# Patient Record
Sex: Male | Born: 1955 | Race: White | Hispanic: No | Marital: Married | Smoking: Former smoker
Health system: Southern US, Community
[De-identification: ages and names within clinical notes are randomized; demographics above are authoritative.]

## PROBLEM LIST (undated history)

## (undated) DIAGNOSIS — E119 Type 2 diabetes mellitus without complications: Secondary | ICD-10-CM

## (undated) DIAGNOSIS — Z720 Tobacco use: Secondary | ICD-10-CM

## (undated) DIAGNOSIS — E1165 Type 2 diabetes mellitus with hyperglycemia: Secondary | ICD-10-CM

## (undated) DIAGNOSIS — E785 Hyperlipidemia, unspecified: Secondary | ICD-10-CM

## (undated) DIAGNOSIS — K219 Gastro-esophageal reflux disease without esophagitis: Secondary | ICD-10-CM

## (undated) DIAGNOSIS — Z86010 Personal history of colonic polyps: Secondary | ICD-10-CM

## (undated) DIAGNOSIS — I2510 Atherosclerotic heart disease of native coronary artery without angina pectoris: Secondary | ICD-10-CM

## (undated) HISTORY — DX: Type 2 diabetes mellitus with hyperglycemia: E11.65

## (undated) HISTORY — DX: Atherosclerotic heart disease of native coronary artery without angina pectoris: I25.10

## (undated) HISTORY — DX: Gastro-esophageal reflux disease without esophagitis: K21.9

## (undated) HISTORY — PX: WISDOM TOOTH EXTRACTION: SHX21

## (undated) HISTORY — DX: Personal history of colonic polyps: Z86.010

## (undated) HISTORY — DX: Hyperlipidemia, unspecified: E78.5

## (undated) HISTORY — DX: Type 2 diabetes mellitus without complications: E11.9

## (undated) HISTORY — DX: Tobacco use: Z72.0

---

## 1996-01-26 HISTORY — PX: ORIF ACETABULAR FRACTURE: SHX5029

## 2011-10-11 ENCOUNTER — Encounter: Payer: Self-pay | Admitting: Family Medicine

## 2011-10-11 ENCOUNTER — Ambulatory Visit (INDEPENDENT_AMBULATORY_CARE_PROVIDER_SITE_OTHER): Payer: Self-pay | Admitting: Family Medicine

## 2011-10-11 VITALS — BP 122/82 | HR 84 | Temp 98.6°F | Ht 68.0 in | Wt 257.8 lb

## 2011-10-11 DIAGNOSIS — E669 Obesity, unspecified: Secondary | ICD-10-CM

## 2011-10-11 DIAGNOSIS — Z125 Encounter for screening for malignant neoplasm of prostate: Secondary | ICD-10-CM

## 2011-10-11 DIAGNOSIS — Z13220 Encounter for screening for lipoid disorders: Secondary | ICD-10-CM

## 2011-10-11 DIAGNOSIS — Z131 Encounter for screening for diabetes mellitus: Secondary | ICD-10-CM

## 2011-10-11 DIAGNOSIS — Z720 Tobacco use: Secondary | ICD-10-CM

## 2011-10-11 DIAGNOSIS — Z1211 Encounter for screening for malignant neoplasm of colon: Secondary | ICD-10-CM

## 2011-10-11 DIAGNOSIS — R5381 Other malaise: Secondary | ICD-10-CM

## 2011-10-11 DIAGNOSIS — F17200 Nicotine dependence, unspecified, uncomplicated: Secondary | ICD-10-CM

## 2011-10-11 DIAGNOSIS — R739 Hyperglycemia, unspecified: Secondary | ICD-10-CM

## 2011-10-11 DIAGNOSIS — R5383 Other fatigue: Secondary | ICD-10-CM

## 2011-10-11 DIAGNOSIS — R7309 Other abnormal glucose: Secondary | ICD-10-CM

## 2011-10-11 DIAGNOSIS — Z0000 Encounter for general adult medical examination without abnormal findings: Secondary | ICD-10-CM

## 2011-10-11 HISTORY — DX: Tobacco use: Z72.0

## 2011-10-11 LAB — PSA: PSA: 0.26 ng/mL (ref 0.10–4.00)

## 2011-10-11 LAB — BASIC METABOLIC PANEL
CO2: 27 mEq/L (ref 19–32)
Calcium: 9.2 mg/dL (ref 8.4–10.5)
Creatinine, Ser: 1.1 mg/dL (ref 0.4–1.5)

## 2011-10-11 LAB — CBC WITH DIFFERENTIAL/PLATELET
Basophils Absolute: 0 10*3/uL (ref 0.0–0.1)
Eosinophils Absolute: 0.1 10*3/uL (ref 0.0–0.7)
HCT: 42.9 % (ref 39.0–52.0)
Hemoglobin: 14.3 g/dL (ref 13.0–17.0)
Lymphs Abs: 2.9 10*3/uL (ref 0.7–4.0)
MCHC: 33.3 g/dL (ref 30.0–36.0)
MCV: 92.3 fl (ref 78.0–100.0)
Monocytes Absolute: 0.5 10*3/uL (ref 0.1–1.0)
Monocytes Relative: 4.7 % (ref 3.0–12.0)
Neutro Abs: 6.3 10*3/uL (ref 1.4–7.7)
RDW: 12.5 % (ref 11.5–14.6)

## 2011-10-11 NOTE — Progress Notes (Addendum)
Nature conservation officer at [facility] [address] [place] [address] Phone: [phone] Fax: [phone]  Date:  10/11/2011   Name:  Micheal Clark   DOB:  1955-07-03   MRN:  [id number] Gender: male Age: 56 y.o.  PCP:  Hannah Beat, MD    Chief Complaint: New patient   History of Present Illness:  Micheal Clark is a 56 y.o. pleasant patient who presents with the following:  DOT physical q 2 years  Preventative Health Maintenance Visit:  Health Maintenance Summary Reviewed and updated, unless pt declines services.  Tobacco History Reviewed. Dips Alcohol: No concerns, no excessive use Exercise Habits: Some activity, rec at least 30 mins 5 times a week (minimal now) STD concerns: no risk or activity to increase risk Drug Use: None Encouraged self-testicular check  No past medical history on file.  Past Surgical History  Procedure Date  . Total hip arthroplasty 1998    left    History  Substance Use Topics  . Smoking status: Former Games developer  . Smokeless tobacco: Current User    Types: Chew   Comment: quit 1993  . Alcohol Use: Yes     occassionally    Family History  Problem Relation Age of Onset  . Diabetes Mother   . Hypertension Brother   . Vision loss Paternal Grandfather     No Known Allergies  Medication list has been reviewed and updated.  No outpatient prescriptions prior to visit.    Review of Systems:   General: Denies fever, chills, sweats. No significant weight loss. Eyes: Denies blurring,significant itching ENT: Denies earache, sore throat, and hoarseness. Cardiovascular: Denies chest pains, palpitations, dyspnea on exertion Respiratory: Denies cough, dyspnea at rest,wheeezing Breast: no concerns about lumps GI: Denies nausea, vomiting, diarrhea, constipation, change in bowel habits, abdominal pain, melena, hematochezia GU: Denies penile discharge, ED, urinary flow / outflow problems. No STD concerns. Musculoskeletal:  Denies back pain, joint pain Derm: Denies rash, itching Neuro: Denies  paresthesias, frequent falls, frequent headaches Psych: Denies depression, anxiety Endocrine: Denies cold intolerance, heat intolerance, polydipsia Heme: Denies enlarged lymph nodes Allergy: No hayfever  Physical Examination: Filed Vitals:   10/11/11 1401  BP: 122/82  Pulse: 84  Temp: 98.6 F (37 C)   Filed Vitals:   10/11/11 1401  Height: 5\' 8"  (1.727 m)  Weight: 257 lb 12 oz (116.915 kg)   Body mass index is 39.19 kg/(m^2). Ideal Body Weight: Weight in (lb) to have BMI = 25: 164.1    Wt Readings from Last 3 Encounters:  10/11/11 257 lb 12 oz (116.915 kg)    GEN: well developed, well nourished, no acute distress Eyes: conjunctiva and lids normal, PERRLA, EOMI ENT: TM clear, nares clear, oral exam WNL Neck: supple, no lymphadenopathy, no thyromegaly, no JVD Pulm: clear to auscultation and percussion, respiratory effort normal CV: regular rate and rhythm, S1-S2, no murmur, rub or gallop, no bruits, peripheral pulses normal and symmetric, no cyanosis, clubbing, edema or varicosities Chest: no scars, masses GI: soft, non-tender; no hepatosplenomegaly, masses; active bowel sounds all quadrants GU: no hernia, testicular mass, penile discharge, or prostate enlargement Lymph: no cervical, axillary or inguinal adenopathy MSK: gait normal, muscle tone and strength WNL, no joint swelling, effusions, discoloration, crepitus  SKIN: clear, good turgor, color WNL, no rashes, lesions, or ulcerations Neuro: normal mental status, normal strength, sensation, and motion Psych: alert; oriented to person, place and time, normally interactive and not anxious or depressed in appearance.  Assessment and  Plan:  1. Routine general medical examination at a health care facility    2. Screening for lipoid disorders  LDL cholesterol, direct  3. Screening for diabetes mellitus  Basic metabolic panel  4. Special screening for  malignant neoplasm of prostate  PSA  5. Special screening for malignant neoplasm of colon  Fecal occult blood, imunochemical  6. Other malaise and fatigue  CBC with Differential  7. Smokeless tobacco use    8. Obesity     The patient's preventative maintenance and recommended screening tests for an annual wellness exam were reviewed in full today. Brought up to date unless services declined.  Counselled on the importance of diet, exercise, and its role in overall health and mortality. The patient's FH and SH was reviewed, including their home life, tobacco status, and drug and alcohol status.    Orders Today:  Orders Placed This Encounter  Procedures  . Fecal occult blood, imunochemical    Standing Status: Future     Number of Occurrences:      Standing Expiration Date: 10/10/2012  . CBC with Differential  . Basic metabolic panel  . LDL cholesterol, direct  . PSA    Updated Medication List: (Includes new medications, updates to list, dose adjustments) No outpatient encounter prescriptions on file as of 10/11/2011.    Medications Discontinued: There are no discontinued medications.   Hannah Beat, MD  Addendum:  Basic Metabolic Panel:    Component Value Date/Time   NA 137 10/11/2011 1445   K 4.0 10/11/2011 1445   CL 102 10/11/2011 1445   CO2 27 10/11/2011 1445   BUN 14 10/11/2011 1445   CREATININE 1.1 10/11/2011 1445   GLUCOSE 260* 10/11/2011 1445   CALCIUM 9.2 10/11/2011 1445    Lab Results  Component Value Date   HGBA1C 7.8* 10/11/2011    D/w patient. Sending in metformin XR 500 mg, 1 po daily, then increase to 2 po daily after 1 week.

## 2011-10-12 ENCOUNTER — Telehealth: Payer: Self-pay

## 2011-10-12 NOTE — Telephone Encounter (Signed)
Pt left v/m;missed call earlier from Dr Patsy Lager; call back  today after 5 pm or 12 noon 10/13/11.

## 2011-10-13 MED ORDER — METFORMIN HCL ER 500 MG PO TB24: ORAL_TABLET | ORAL | Status: DC

## 2011-10-13 NOTE — Addendum Note (Signed)
Addended by: Hannah Beat on: 10/13/2011 05:00 PM   Modules accepted: Orders

## 2011-10-13 NOTE — Telephone Encounter (Signed)
Discussed with him on the phone

## 2011-10-26 ENCOUNTER — Other Ambulatory Visit: Payer: Self-pay

## 2011-10-26 DIAGNOSIS — Z1211 Encounter for screening for malignant neoplasm of colon: Secondary | ICD-10-CM

## 2011-10-27 ENCOUNTER — Telehealth: Payer: Self-pay

## 2011-10-27 ENCOUNTER — Other Ambulatory Visit: Payer: Self-pay | Admitting: Family Medicine

## 2011-10-27 DIAGNOSIS — Z1211 Encounter for screening for malignant neoplasm of colon: Secondary | ICD-10-CM

## 2011-10-27 DIAGNOSIS — R195 Other fecal abnormalities: Secondary | ICD-10-CM

## 2011-10-27 NOTE — Telephone Encounter (Signed)
pts wife Bonita Quin called to get more detail about pt results called earlier. Patient's wife notified as instructed by telephone for result notes due to positive fecal blood test. Bonita Quin voiced understanding.

## 2011-10-28 ENCOUNTER — Telehealth: Payer: Self-pay

## 2011-10-28 NOTE — Telephone Encounter (Signed)
Pt left v/m returning call. Call pt [phone].

## 2011-10-29 ENCOUNTER — Encounter: Payer: Self-pay | Admitting: Internal Medicine

## 2011-10-29 NOTE — Telephone Encounter (Signed)
See 10/27/11 phone note

## 2011-11-11 ENCOUNTER — Ambulatory Visit (AMBULATORY_SURGERY_CENTER): Payer: Self-pay | Admitting: *Deleted

## 2011-11-11 VITALS — Ht 68.0 in | Wt 250.0 lb

## 2011-11-11 DIAGNOSIS — R195 Other fecal abnormalities: Secondary | ICD-10-CM

## 2011-11-11 DIAGNOSIS — Z1211 Encounter for screening for malignant neoplasm of colon: Secondary | ICD-10-CM

## 2011-11-11 MED ORDER — NA SULFATE-K SULFATE-MG SULF 17.5-3.13-1.6 GM/177ML PO SOLN: 1.0000 | Freq: Once | ORAL | Status: DC

## 2011-11-15 ENCOUNTER — Ambulatory Visit (INDEPENDENT_AMBULATORY_CARE_PROVIDER_SITE_OTHER): Payer: Self-pay | Admitting: Family Medicine

## 2011-11-15 ENCOUNTER — Encounter: Payer: Self-pay | Admitting: Family Medicine

## 2011-11-15 VITALS — BP 120/70 | HR 88 | Temp 98.4°F | Wt 253.5 lb

## 2011-11-15 DIAGNOSIS — E1165 Type 2 diabetes mellitus with hyperglycemia: Secondary | ICD-10-CM

## 2011-11-15 DIAGNOSIS — E785 Hyperlipidemia, unspecified: Secondary | ICD-10-CM

## 2011-11-15 DIAGNOSIS — E119 Type 2 diabetes mellitus without complications: Secondary | ICD-10-CM | POA: Insufficient documentation

## 2011-11-15 DIAGNOSIS — IMO0002 Reserved for concepts with insufficient information to code with codable children: Secondary | ICD-10-CM

## 2011-11-15 HISTORY — DX: Hyperlipidemia, unspecified: E78.5

## 2011-11-15 HISTORY — DX: Reserved for concepts with insufficient information to code with codable children: IMO0002

## 2011-11-15 HISTORY — DX: Type 2 diabetes mellitus with hyperglycemia: E11.65

## 2011-11-15 MED ORDER — METFORMIN HCL ER 500 MG PO TB24: 1000.0000 mg | ORAL_TABLET | Freq: Every day | ORAL | Status: DC

## 2011-11-15 MED ORDER — PRAVASTATIN SODIUM 40 MG PO TABS: 40.0000 mg | ORAL_TABLET | Freq: Every day | ORAL | Status: DC

## 2011-11-15 NOTE — Progress Notes (Signed)
Nature conservation officer at [facility] [address] [place] [address] Phone: [phone] Fax: [phone]  Date:  11/15/2011   Name:  Micheal Clark   DOB:  1955-09-01   MRN:  [id number] Gender: male Age: 56 y.o.  PCP:  Hannah Beat, MD  Evaluating MD: Hannah Beat, MD   Chief Complaint: Follow-up   History of Present Illness:  Micheal Clark is a 56 y.o. pleasant patient who presents with the following:  New onset DM:   New onset hyperlipidemia:  Told the patient's labs. He is a new onset diabetic. He has since started taking some metformin XOR 500 mg, 2 tablets a day. He is tolerating this well without any side effects and his blood sugars have been ranging from about 100 to 1:30 without any difficulties.  Also reviewed his hyperlipidemia, with an LDL at 163.  Results for orders placed in visit on 10/26/11  FECAL OCCULT BLOOD, IMMUNOCHEMICAL      Component Value Range   Fecal Occult Bld Positive  Negative    Lab Results  Component Value Date   HGBA1C 7.8* 10/11/2011   Lipids:    Component Value Date/Time   LDLDIRECT 162.9 10/11/2011 1445   Basic Metabolic Panel:    Component Value Date/Time   NA 137 10/11/2011 1445   K 4.0 10/11/2011 1445   CL 102 10/11/2011 1445   CO2 27 10/11/2011 1445   BUN 14 10/11/2011 1445   CREATININE 1.1 10/11/2011 1445   GLUCOSE 260* 10/11/2011 1445   CALCIUM 9.2 10/11/2011 1445     Patient Active Problem List  Diagnosis  . Smokeless tobacco use  . Obesity    Past Medical History  Diagnosis Date  . Smokeless tobacco use 10/11/2011  . Diabetes mellitus without complication     Past Surgical History  Procedure Date  . Total hip arthroplasty 1998    left  . Wisdom tooth extraction     History  Substance Use Topics  . Smoking status: Former Games developer  . Smokeless tobacco: Current User    Types: Chew   Comment: quit 1993  . Alcohol Use: Yes     occassionally-not wekly    Family History  Problem Relation  Age of Onset  . Diabetes Mother   . Hypertension Brother   . Vision loss Paternal Grandfather   . Colon cancer Neg Hx   . Rectal cancer Neg Hx   . Stomach cancer Neg Hx     No Known Allergies  Medication list has been reviewed and updated.  Outpatient Prescriptions Prior to Visit  Medication Sig Dispense Refill  . Na Sulfate-K Sulfate-Mg Sulf (SUPREP BOWEL PREP) SOLN Take 1 kit by mouth once. suprep as directed. No substitutions  354 mL  0  . metFORMIN (GLUCOPHAGE XR) 500 MG 24 hr tablet 1 po daily for 1 week, then 2 po daily  60 tablet  1    Review of Systems:   GEN: No acute illnesses, no fevers, chills. GI: No n/v/d, eating normally. GI f/u for heme + stool pending Pulm: No SOB Interactive and getting along well at home.  Otherwise, ROS is as per the HPI.   Physical Examination: Filed Vitals:   11/15/11 1544  BP: 120/70  Pulse: 88  Temp: 98.4 F (36.9 C)  TempSrc: Oral  Weight: 253 lb 8 oz (114.987 kg)    There is no height on file to calculate BMI. Ideal Body Weight:     GEN: WDWN, NAD,  Non-toxic, A & O x 3 HEENT: Atraumatic, Normocephalic. Neck supple. No masses, No LAD. Ears and Nose: No external deformity. CV: RRR, No M/G/R. No JVD. No thrill. No extra heart sounds. PULM: CTA B, no wheezes, crackles, rhonchi. No retractions. No resp. distress. No accessory muscle use. EXTR: No c/c/e NEURO Normal gait.  PSYCH: Normally interactive. Conversant. Not depressed or anxious appearing.  Calm demeanor.    Assessment and Plan:  1. Unstable diabetes mellitus   2. Diabetes type 2, uncontrolled   3. Hyperlipidemia    Reviewed DM Start pravachol  Recheck 6 mo  Orders Today:  No orders of the defined types were placed in this encounter.    Updated Medication List: (Includes new medications, updates to list, dose adjustments) Meds ordered this encounter  Medications  . DISCONTD: metFORMIN (GLUCOPHAGE-XR) 500 MG 24 hr tablet    Sig: Take 2 by mouth daily    . pravastatin (PRAVACHOL) 40 MG tablet    Sig: Take 1 tablet (40 mg total) by mouth daily.    Dispense:  90 tablet    Refill:  3  . metFORMIN (GLUCOPHAGE-XR) 500 MG 24 hr tablet    Sig: Take 2 tablets (1,000 mg total) by mouth daily with breakfast. Take 2 by mouth daily    Dispense:  180 tablet    Refill:  3    Medications Discontinued: Medications Discontinued During This Encounter  Medication Reason  . metFORMIN (GLUCOPHAGE XR) 500 MG 24 hr tablet   . metFORMIN (GLUCOPHAGE-XR) 500 MG 24 hr tablet Reorder     Hannah Beat, MD

## 2011-11-24 ENCOUNTER — Encounter: Payer: Self-pay | Admitting: Internal Medicine

## 2011-11-24 ENCOUNTER — Ambulatory Visit (AMBULATORY_SURGERY_CENTER): Payer: Self-pay | Admitting: Internal Medicine

## 2011-11-24 VITALS — BP 118/78 | HR 69 | Temp 97.7°F | Resp 16 | Ht 68.0 in | Wt 250.0 lb

## 2011-11-24 DIAGNOSIS — Z860100 Personal history of colon polyps, unspecified: Secondary | ICD-10-CM | POA: Insufficient documentation

## 2011-11-24 DIAGNOSIS — D126 Benign neoplasm of colon, unspecified: Secondary | ICD-10-CM

## 2011-11-24 DIAGNOSIS — Z1211 Encounter for screening for malignant neoplasm of colon: Secondary | ICD-10-CM

## 2011-11-24 DIAGNOSIS — R195 Other fecal abnormalities: Secondary | ICD-10-CM

## 2011-11-24 HISTORY — DX: Personal history of colon polyps, unspecified: Z86.0100

## 2011-11-24 HISTORY — DX: Personal history of colonic polyps: Z86.010

## 2011-11-24 MED ORDER — SODIUM CHLORIDE 0.9 % IV SOLN: 500.0000 mL | INTRAVENOUS | Status: DC

## 2011-11-24 NOTE — Op Note (Signed)
[facility] [address] [place], [address]   COLONOSCOPY PROCEDURE REPORT  PATIENT: Micheal Clark, Micheal Clark  MR#: [id number] BIRTHDATE: 01-02-1956 , 55  yrs. old GENDER: Male ENDOSCOPIST: Iva Boop, MD, [facility] REFERRED XB:[id number] Ward Chatters, M.D. PROCEDURE DATE:  11/24/2011 PROCEDURE:   Colonoscopy with snare polypectomy ASA CLASS:   Class II INDICATIONS:heme-positive stool. MEDICATIONS: propofol (Diprivan) 350mg  IV, MAC sedation, administered by CRNA, and These medications were titrated to patient response per physician's verbal order  DESCRIPTION OF PROCEDURE:   After the risks benefits and alternatives of the procedure were thoroughly explained, informed consent was obtained.  A digital rectal exam revealed no abnormalities of the rectum and A digital rectal exam revealed the prostate was not enlarged.   The LB [id number]  endoscope was introduced through the anus and advanced to the cecum, which was identified by both the appendix and ileocecal valve. No adverse events experienced.   The quality of the prep was Suprep excellent The instrument was then slowly withdrawn as the colon was fully examined.      COLON FINDINGS: Five polypoid shaped sessile and pedunculated polyps measuring 3-12 mm in size were found at the cecum, in the transverse colon, descending colon, and sigmoid colon.  A polypectomy was performed with a cold snare on the 3 diminutive polyps and using snare cautery on the two sigmoid polyps (10 and 12 mm).  The resection was complete and the polyp tissue was completely retrieved.   The colon mucosa was otherwise normal. Retroflexed views revealed no abnormalities. The time to cecum=2 minutes 33 seconds.  Withdrawal time=15 minutes 35 seconds.  The scope was withdrawn and the procedure completed. COMPLICATIONS: There were no complications.  ENDOSCOPIC IMPRESSION: 1.   Five sessile polyps measuring 3-12 mm in size were found at  the cecum, in the transverse colon, descending colon, and sigmoid colon; polypectomy was performed with a cold snare and using snare cautery 2.   The colon mucosa was otherwise normal with excllent prep  RECOMMENDATIONS: Hold aspirin, aspirin products, and anti-inflammatory medication for 1 week.   eSigned:  Iva Boop, MD, [facility] 11/24/2011 9:09 AM  cc: Juleen China, MD and The Patient

## 2011-11-24 NOTE — Progress Notes (Signed)
Patient did not have preoperative order for IV antibiotic SSI prophylaxis. (G8918)  Patient did not experience any of the following events: a burn prior to discharge; a fall within the facility; wrong site/side/patient/procedure/implant event; or a hospital transfer or hospital admission upon discharge from the facility. (G8907)  

## 2011-11-24 NOTE — Patient Instructions (Addendum)
Four polyps were removed. Dr. Patsy Lager was looking out for you by checking the stool fr blood. The polyps were removed so they cannot develop into cancer. I will send you a letter about the results.  Thank you for choosing me and [facility].  Iva Boop, MD, FACG   YOU HAD AN ENDOSCOPIC PROCEDURE TODAY AT THE  ENDOSCOPY CENTER: Refer to the procedure report that was given to you for any specific questions about what was found during the examination.  If the procedure report does not answer your questions, please call your gastroenterologist to clarify.  If you requested that your care partner not be given the details of your procedure findings, then the procedure report has been included in a sealed envelope for you to review at your convenience later.  YOU SHOULD EXPECT: Some feelings of bloating in the abdomen. Passage of more gas than usual.  Walking can help get rid of the air that was put into your GI tract during the procedure and reduce the bloating. If you had a lower endoscopy (such as a colonoscopy or flexible sigmoidoscopy) you may notice spotting of blood in your stool or on the toilet paper. If you underwent a bowel prep for your procedure, then you may not have a normal bowel movement for a few days.  DIET: Your first meal following the procedure should be a light meal and then it is ok to progress to your normal diet.  A half-sandwich or bowl of soup is an example of a good first meal.  Heavy or fried foods are harder to digest and may make you feel nauseous or bloated.  Likewise meals heavy in dairy and vegetables can cause extra gas to form and this can also increase the bloating.  Drink plenty of fluids but you should avoid alcoholic beverages for 24 hours.  ACTIVITY: Your care partner should take you home directly after the procedure.  You should plan to take it easy, moving slowly for the rest of the day.  You can resume normal activity the day after the  procedure however you should NOT DRIVE or use heavy machinery for 24 hours (because of the sedation medicines used during the test).    SYMPTOMS TO REPORT IMMEDIATELY: A gastroenterologist can be reached at any hour.  During normal business hours, 8:30 AM to 5:00 PM Monday through Friday, call [phone].  After hours and on weekends, please call the GI answering service at [phone] who will take a message and have the physician on call contact you.   Following lower endoscopy (colonoscopy or flexible sigmoidoscopy):  Excessive amounts of blood in the stool  Significant tenderness or worsening of abdominal pains  Swelling of the abdomen that is new, acute  Fever of 100F or higher   FOLLOW UP: If any biopsies were taken you will be contacted by phone or by letter within the next 1-3 weeks.  Call your gastroenterologist if you have not heard about the biopsies in 3 weeks.  Our staff will call the home number listed on your records the next business day following your procedure to check on you and address any questions or concerns that you may have at that time regarding the information given to you following your procedure. This is a courtesy call and so if there is no answer at the home number and we have not heard from you through the emergency physician on call, we will assume that you have returned to your regular  daily activities without incident.  SIGNATURES/CONFIDENTIALITY: You and/or your care partner have signed paperwork which will be entered into your electronic medical record.  These signatures attest to the fact that that the information above on your After Visit Summary has been reviewed and is understood.  Full responsibility of the confidentiality of this discharge information lies with you and/or your care-partner.    Hold aspirin,aspirin products and anti-inflammatory medications for 1 week, but resume all other medications.Information given on polyps with discharge  instructions.

## 2011-11-25 ENCOUNTER — Telehealth: Payer: Self-pay | Admitting: *Deleted

## 2011-11-25 NOTE — Telephone Encounter (Signed)
  Follow up Call-  Call back number 11/24/2011  Post procedure Call Back phone  # [phone]  Permission to leave phone message Yes     Patient questions:  Do you have a fever, pain , or abdominal swelling? no Pain Score  0 *  Have you tolerated food without any problems? yes  Have you been able to return to your normal activities? yes  Do you have any questions about your discharge instructions: Diet   no Medications  no Follow up visit  no  Do you have questions or concerns about your Care? no  Actions: * If pain score is 4 or above: No action needed, pain <4.

## 2011-11-30 ENCOUNTER — Encounter: Payer: Self-pay | Admitting: Internal Medicine

## 2011-11-30 NOTE — Progress Notes (Signed)
Quick Note:  4 adenomas max 12 mm Repeat colon 11/2014 ______

## 2012-01-31 ENCOUNTER — Other Ambulatory Visit: Payer: Self-pay | Admitting: Family Medicine

## 2012-05-03 ENCOUNTER — Other Ambulatory Visit: Payer: Self-pay | Admitting: Family Medicine

## 2013-10-03 ENCOUNTER — Ambulatory Visit (INDEPENDENT_AMBULATORY_CARE_PROVIDER_SITE_OTHER): Payer: Self-pay | Admitting: Family Medicine

## 2013-10-03 ENCOUNTER — Encounter: Payer: Self-pay | Admitting: Family Medicine

## 2013-10-03 VITALS — BP 124/70 | HR 91 | Temp 98.6°F | Ht 66.75 in | Wt 234.0 lb

## 2013-10-03 DIAGNOSIS — R7309 Other abnormal glucose: Secondary | ICD-10-CM

## 2013-10-03 DIAGNOSIS — R739 Hyperglycemia, unspecified: Secondary | ICD-10-CM

## 2013-10-03 DIAGNOSIS — IMO0002 Reserved for concepts with insufficient information to code with codable children: Secondary | ICD-10-CM

## 2013-10-03 DIAGNOSIS — E1165 Type 2 diabetes mellitus with hyperglycemia: Secondary | ICD-10-CM

## 2013-10-03 DIAGNOSIS — IMO0001 Reserved for inherently not codable concepts without codable children: Secondary | ICD-10-CM

## 2013-10-03 DIAGNOSIS — E785 Hyperlipidemia, unspecified: Secondary | ICD-10-CM

## 2013-10-03 LAB — GLUCOSE, POCT (MANUAL RESULT ENTRY): POC GLUCOSE: 233 mg/dL — AB (ref 70–99)

## 2013-10-03 MED ORDER — METFORMIN HCL ER 500 MG PO TB24: 500.0000 mg | ORAL_TABLET | Freq: Every day | ORAL | Status: DC

## 2013-10-03 MED ORDER — SIMVASTATIN 40 MG PO TABS
40.0000 mg | ORAL_TABLET | Freq: Every day | ORAL | Status: DC
Start: 2013-10-03 — End: 2014-12-28

## 2013-10-03 MED ORDER — METFORMIN HCL ER 500 MG PO TB24: 1000.0000 mg | ORAL_TABLET | Freq: Every day | ORAL | Status: DC

## 2013-10-03 NOTE — Patient Instructions (Signed)
Write your blood sugar down twice a day.  Do it one time fasting in the morning.   Another time 2 hours after eating -- like 2 hours after lunch.  Vary that up a little bit between breakfast, lunch, and dinner.

## 2013-10-03 NOTE — Progress Notes (Signed)
Dr. Frederico Hamman T. Herley Bernardini, MD, [facility] [place], [address] Phone: [phone] Fax: [phone]  10/03/2013  Patient: Micheal Clark, MRN: [id number], DOB: 27-May-1955, 58 y.o.  Primary Physician:  Owens Loffler, MD  Chief Complaint: Check Blood Sugar  Subjective:   Micheal Clark is a 58 y.o. very pleasant male patient who presents with the following:  Patient lost insurance and stopped all of his medication.  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  Stopped lipid meds, too.  Results for orders placed in visit on 10/03/13  GLUCOSE, POCT (MANUAL RESULT ENTRY)      Result Value Ref Range   POC Glucose 233 (*) 70 - 99 mg/dl     Past Medical History, Surgical History, Social History, Family History, Problem List, Medications, and Allergies have been reviewed and updated if relevant.   GEN: No acute illnesses, no fevers, chills. GI: No n/v/d, eating normally Pulm: No SOB Interactive and getting along well at home.  Otherwise, ROS is as per the HPI.  Objective:   BP 124/70  Pulse 91  Temp(Src) 98.6 F (37 C) (Oral)  Ht 5' 6.75" (1.695 m)  Wt 234 lb (106.142 kg)  BMI 36.94 kg/m2  SpO2 97%  GEN: WDWN, NAD, Non-toxic, A & O x 3 HEENT: Atraumatic, Normocephalic. Neck supple. No masses, No LAD. Ears and Nose: No external deformity. CV: RRR, No M/G/R. No JVD. No thrill. No extra heart sounds. PULM: CTA B, no wheezes, crackles, rhonchi. No retractions. No resp. distress. No accessory muscle use. EXTR: No c/c/e NEURO Normal gait.  PSYCH: Normally interactive. Conversant. Not depressed or anxious appearing.  Calm demeanor.   Laboratory and Imaging Data:  Assessment and Plan:   Diabetes type 2, uncontrolled  Hyperglycemia - Plan: Glucose (CBG)  Hyperlipidemia  Restart meds, check bs bid, am and 2 hours after meal.   Not cleared until more stable.   Follow-up: Return in about 2 weeks  (around 10/17/2013).  New Prescriptions   METFORMIN (GLUCOPHAGE-XR) 500 MG 24 HR TABLET    Take 2 tablets (1,000 mg total) by mouth daily with breakfast.   SIMVASTATIN (ZOCOR) 40 MG TABLET    Take 1 tablet (40 mg total) by mouth at bedtime.   Orders Placed This Encounter  Procedures  . Glucose (CBG)   Patient Instructions  Write your blood sugar down twice a day.  Do it one time fasting in the morning.   Another time 2 hours after eating -- like 2 hours after lunch.  Vary that up a little bit between breakfast, lunch, and dinner.      Signed,  Maud Deed. Hailei Besser, MD   Patient's Medications  New Prescriptions   METFORMIN (GLUCOPHAGE-XR) 500 MG 24 HR TABLET    Take 2 tablets (1,000 mg total) by mouth daily with breakfast.   SIMVASTATIN (ZOCOR) 40 MG TABLET    Take 1 tablet (40 mg total) by mouth at bedtime.  Previous Medications   No medications on file  Modified Medications   No medications on file  Discontinued Medications   METFORMIN (GLUCOPHAGE-XR) 500 MG 24 HR TABLET    Take 2 tablets (1,000 mg total) by mouth daily with breakfast. Take 2 by mouth daily   METFORMIN (GLUCOPHAGE-XR) 500 MG 24 HR TABLET    TAKE 1 TABLET DAILY FOR 1 WEEK THEN TAKE TWO TABLETS DAILY   PRAVASTATIN (PRAVACHOL) 40 MG TABLET  Take 1 tablet (40 mg total) by mouth daily.

## 2013-10-03 NOTE — Progress Notes (Signed)
Pre visit review using our clinic review tool, if applicable. No additional management support is needed unless otherwise documented below in the visit note. 

## 2013-10-10 ENCOUNTER — Encounter: Payer: Self-pay | Admitting: Family Medicine

## 2013-10-10 ENCOUNTER — Ambulatory Visit (INDEPENDENT_AMBULATORY_CARE_PROVIDER_SITE_OTHER): Payer: Self-pay | Admitting: Family Medicine

## 2013-10-10 VITALS — BP 128/76 | HR 65 | Temp 98.2°F | Ht 66.75 in | Wt 235.0 lb

## 2013-10-10 DIAGNOSIS — IMO0002 Reserved for concepts with insufficient information to code with codable children: Secondary | ICD-10-CM

## 2013-10-10 DIAGNOSIS — IMO0001 Reserved for inherently not codable concepts without codable children: Secondary | ICD-10-CM

## 2013-10-10 DIAGNOSIS — E1165 Type 2 diabetes mellitus with hyperglycemia: Secondary | ICD-10-CM

## 2013-10-10 NOTE — Progress Notes (Signed)
   Dr. Frederico Hamman T. Marlynn Hinckley, MD, [facility] [place], [address] Phone: [phone] Fax: [phone]  10/10/2013  Patient: Micheal Clark, MRN: [id number], DOB: 1955-03-11, 58 y.o.  Primary Physician:  Owens Loffler, MD  Chief Complaint: Follow-up  Subjective:   Micheal Clark is a 58 y.o. very pleasant male patient who presents with the following:  F/u DM: 1 week on Metformin XR, 500 mg x 2. BS has improved. BS improved from 105-170 range.   Past Medical History, Surgical History, Social History, Family History, Problem List, Medications, and Allergies have been reviewed and updated if relevant.   GEN: No acute illnesses, no fevers, chills. GI: No n/v/d, eating normally Pulm: No SOB Interactive and getting along well at home.  Otherwise, ROS is as per the HPI.  Objective:   BP 128/76  Pulse 65  Temp(Src) 98.2 F (36.8 C) (Oral)  Ht 5' 6.75" (1.695 m)  Wt 235 lb (106.595 kg)  BMI 37.10 kg/m2  SpO2 98%  GEN: WDWN, NAD, Non-toxic, A & O x 3 HEENT: Atraumatic, Normocephalic. Neck supple. No masses, No LAD. Ears and Nose: No external deformity. CV: RRR, No M/G/R. No JVD. No thrill. No extra heart sounds. PULM: CTA B, no wheezes, crackles, rhonchi. No retractions. No resp. distress. No accessory muscle use. EXTR: No c/c/e NEURO Normal gait.  PSYCH: Normally interactive. Conversant. Not depressed or anxious appearing.  Calm demeanor.   Laboratory and Imaging Data:  Assessment and Plan:   Diabetes type 2, uncontrolled  Notes reviewed, improving and letter given  Follow-up: No Follow-up on file.  New Prescriptions   No medications on file   No orders of the defined types were placed in this encounter.    Signed,  Maud Deed. Ruthann Angulo, MD   Patient's Medications  New Prescriptions   No medications on file  Previous Medications   METFORMIN (GLUCOPHAGE-XR) 500 MG 24 HR TABLET    Take 2 tablets  (1,000 mg total) by mouth daily with breakfast.   SIMVASTATIN (ZOCOR) 40 MG TABLET    Take 1 tablet (40 mg total) by mouth at bedtime.  Modified Medications   No medications on file  Discontinued Medications   No medications on file

## 2013-10-10 NOTE — Progress Notes (Signed)
Pre visit review using our clinic review tool, if applicable. No additional management support is needed unless otherwise documented below in the visit note. 

## 2014-07-15 ENCOUNTER — Telehealth: Payer: Self-pay

## 2014-07-15 NOTE — Telephone Encounter (Signed)
Diabetic Bundle. Attempted to contact the pt. Phone number would not connect.

## 2014-08-20 ENCOUNTER — Other Ambulatory Visit: Payer: Self-pay | Admitting: Family Medicine

## 2014-08-20 ENCOUNTER — Telehealth: Payer: Self-pay

## 2014-08-20 NOTE — Telephone Encounter (Signed)
Pt called and requested refill of metformin to [facility]; advised pt refill had been done earlier today and pt will ck with pharmacy for pick up.

## 2014-08-20 NOTE — Telephone Encounter (Signed)
Please call and schedule CPE with fasting labs with Dr. Lorelei Pont.

## 2014-08-26 ENCOUNTER — Ambulatory Visit (INDEPENDENT_AMBULATORY_CARE_PROVIDER_SITE_OTHER): Payer: Self-pay | Admitting: Family Medicine

## 2014-08-26 ENCOUNTER — Encounter: Payer: Self-pay | Admitting: Family Medicine

## 2014-08-26 VITALS — BP 146/84 | HR 70 | Temp 98.2°F | Ht 66.75 in | Wt 236.8 lb

## 2014-08-26 DIAGNOSIS — IMO0002 Reserved for concepts with insufficient information to code with codable children: Secondary | ICD-10-CM

## 2014-08-26 DIAGNOSIS — E785 Hyperlipidemia, unspecified: Secondary | ICD-10-CM

## 2014-08-26 DIAGNOSIS — E1165 Type 2 diabetes mellitus with hyperglycemia: Secondary | ICD-10-CM

## 2014-08-26 LAB — LIPID PANEL
CHOL/HDL RATIO: 4
Cholesterol: 176 mg/dL (ref 0–200)
HDL: 41.2 mg/dL (ref >39.00)
LDL Cholesterol: 111 mg/dL — ABNORMAL HIGH (ref 0–99)
NonHDL: 135.17
Triglycerides: 121 mg/dL (ref 0.0–149.0)
VLDL: 24.2 mg/dL (ref 0.0–40.0)

## 2014-08-26 LAB — HEMOGLOBIN A1C: HEMOGLOBIN A1C: 9.3 % — AB (ref 4.6–6.5)

## 2014-08-26 NOTE — Progress Notes (Signed)
Pre visit review using our clinic review tool, if applicable. No additional management support is needed unless otherwise documented below in the visit note. 

## 2014-08-26 NOTE — Progress Notes (Signed)
Dr. Frederico Hamman T. Kalen Ratajczak, MD, [facility] Primary Care and Sports Medicine [place], [address] Phone: [phone] Fax: [phone]  08/26/2014  Patient: Micheal Clark, MRN: [id number], DOB: [birth year], 59 y.o.  Primary Physician:  Owens Loffler, MD  Chief Complaint: Diabetes  Subjective:   Micheal Clark is a 60 y.o. very pleasant male patient who presents with the following:  147/70 at MD for his shoulder last week.  125/88.   170-260 at home.   Diabetes Mellitus: Tolerating Medications: yes Compliance with diet: fair Exercise: minimal / intermittent Avg blood sugars at home: 170-260 Foot problems: none Hypoglycemia: none No nausea, vomitting, blurred vision, polyuria.  Lab Results  Component Value Date   HGBA1C 7.8* 10/11/2011   Lab Results  Component Value Date   CREATININE 1.1 10/11/2011    Wt Readings from Last 3 Encounters:  08/26/14 236 lb 12 oz (107.389 kg)  10/10/13 235 lb (106.595 kg)  10/03/13 234 lb (106.142 kg)    Body mass index is 37.38 kg/(m^2).   Lipids: Doing well, stable. Tolerating meds fine with no SE. Panel reviewed with patient.  Lipids: Lipids:    Component Value Date/Time   LDLDIRECT 162.9 10/11/2011 1445     Past Medical History, Surgical History, Social History, Family History, Problem List, Medications, and Allergies have been reviewed and updated if relevant.  Patient Active Problem List   Diagnosis Date Noted  . Personal history of adenomatous colonic polyps 11/24/2011  . Diabetes type 2, uncontrolled 11/15/2011  . Hyperlipidemia LDL goal <70 11/15/2011  . Smokeless tobacco use 10/11/2011  . Obesity 10/11/2011    Past Medical History  Diagnosis Date  . Smokeless tobacco use 10/11/2011  . Diabetes mellitus without complication   . Diabetes type 2, uncontrolled 11/15/2011  . Hyperlipidemia 11/15/2011  . Personal history of adenomatous colonic polyps 11/24/2011    Past Surgical  History  Procedure Laterality Date  . Total hip arthroplasty  1998    left  . Wisdom tooth extraction      History   Social History  . Marital Status: Married    Spouse Name: N/A  . Number of Children: 2  . Years of Education: N/A   Occupational History  . [employer]     truck driver   Social History Main Topics  . Smoking status: Former Research scientist (life sciences)  . Smokeless tobacco: Current User    Types: Chew     Comment: quit 1993  . Alcohol Use: Yes     Comment: occassionally-not wekly  . Drug Use: No  . Sexual Activity: Not on file   Other Topics Concern  . Not on file   Social History Narrative    Family History  Problem Relation Age of Onset  . Diabetes Mother   . Hypertension Brother   . Vision loss Paternal Grandfather   . Colon cancer Neg Hx   . Rectal cancer Neg Hx   . Stomach cancer Neg Hx     No Known Allergies  Medication list reviewed and updated in full in [place].   GEN: No acute illnesses, no fevers, chills. GI: No n/v/d, eating normally Pulm: No SOB Interactive and getting along well at home.  Otherwise, ROS is as per the HPI.  Objective:   BP 146/84 mmHg  Pulse 70  Temp(Src) 98.2 F (36.8 C) (Oral)  Ht 5' 6.75" (1.695 m)  Wt 236 lb 12 oz (107.389 kg)  BMI 37.38 kg/m2  GEN: WDWN, NAD, Non-toxic, A & O x 3 HEENT: Atraumatic, Normocephalic. Neck supple. No masses, No LAD. Ears and Nose: No external deformity. CV: RRR, No M/G/R. No JVD. No thrill. No extra heart sounds. PULM: CTA B, no wheezes, crackles, rhonchi. No retractions. No resp. distress. No accessory muscle use. EXTR: No c/c/e NEURO Normal gait.  PSYCH: Normally interactive. Conversant. Not depressed or anxious appearing.  Calm demeanor.   Laboratory and Imaging Data:  Assessment and Plan:   Diabetes type 2, uncontrolled - Plan: Hemoglobin A1c  Hyperlipidemia LDL goal <70 - Plan: Lipid panel  Check DM and lipid today Adjust meds if needed  Follow-up: 6  mo  New Prescriptions   No medications on file   Orders Placed This Encounter  Procedures  . Hemoglobin A1c  . Lipid panel    Signed,  Frederico Hamman T. Raelin Pixler, MD   Patient's Medications  New Prescriptions   No medications on file  Previous Medications   METFORMIN (GLUCOPHAGE-XR) 500 MG 24 HR TABLET    TAKE 2 TABLETS (1,000 MG TOTAL) BY MOUTH DAILY WITH BREAKFAST.   NAPROXEN (NAPROSYN) 500 MG TABLET    Take 500 mg by mouth daily.    SIMVASTATIN (ZOCOR) 40 MG TABLET    Take 1 tablet (40 mg total) by mouth at bedtime.   TRAMADOL (ULTRAM) 50 MG TABLET    Take 50 mg by mouth every 6 (six) hours as needed.   Modified Medications   No medications on file  Discontinued Medications   No medications on file

## 2014-08-30 ENCOUNTER — Telehealth: Payer: Self-pay | Admitting: *Deleted

## 2014-08-30 MED ORDER — METFORMIN HCL ER 500 MG PO TB24: 2000.0000 mg | ORAL_TABLET | Freq: Every day | ORAL | Status: DC

## 2014-08-30 NOTE — Telephone Encounter (Signed)
Legrand Como notified as instructed by telephone.  Prescription for Metformin with new dosing instructions sent to [facility] on [address].

## 2014-08-30 NOTE — Telephone Encounter (Signed)
-----   Message from Owens Loffler, MD sent at 08/29/2014  6:10 PM EDT ----- Please call  DM came back not looking too good. a1c is 9.3  Chol is not that bad  We should increase his metformin to 4 tablets a day. He will likely need a new script.  Metformin XR 500 mg, 4 tabs po daily.  Electronically prescribe to the pharmacy of their choice. (May call in if pharmacy does not participate in electronic prescriptions) Call in #120, 11 refills. OR if they prefer a 90 day supply, #360 with 3 refills is OK, too

## 2014-12-23 ENCOUNTER — Encounter: Payer: Self-pay | Admitting: Internal Medicine

## 2014-12-28 ENCOUNTER — Other Ambulatory Visit: Payer: Self-pay | Admitting: Family Medicine

## 2015-04-24 ENCOUNTER — Encounter: Payer: Self-pay | Admitting: Family Medicine

## 2015-04-24 ENCOUNTER — Ambulatory Visit (INDEPENDENT_AMBULATORY_CARE_PROVIDER_SITE_OTHER)
Admission: RE | Admit: 2015-04-24 | Discharge: 2015-04-24 | Disposition: A | Discharge status: Home / Self Care | Payer: Self-pay | Source: Ambulatory Visit | Attending: Family Medicine | Admitting: Family Medicine

## 2015-04-24 ENCOUNTER — Ambulatory Visit (INDEPENDENT_AMBULATORY_CARE_PROVIDER_SITE_OTHER): Payer: Self-pay | Admitting: Family Medicine

## 2015-04-24 VITALS — BP 126/84 | HR 90 | Temp 98.5°F | Ht 66.75 in | Wt 234.0 lb

## 2015-04-24 DIAGNOSIS — M1652 Unilateral post-traumatic osteoarthritis, left hip: Secondary | ICD-10-CM

## 2015-04-24 DIAGNOSIS — Z96642 Presence of left artificial hip joint: Secondary | ICD-10-CM

## 2015-04-24 DIAGNOSIS — M25552 Pain in left hip: Secondary | ICD-10-CM

## 2015-04-24 NOTE — Progress Notes (Signed)
Pre visit review using our clinic review tool, if applicable. No additional management support is needed unless otherwise documented below in the visit note. 
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
## 2015-06-25 ENCOUNTER — Encounter: Payer: Self-pay | Admitting: Family Medicine

## 2015-06-25 ENCOUNTER — Ambulatory Visit (INDEPENDENT_AMBULATORY_CARE_PROVIDER_SITE_OTHER): Payer: Self-pay | Admitting: Family Medicine

## 2015-06-25 ENCOUNTER — Telehealth: Payer: Self-pay | Admitting: Family Medicine

## 2015-06-25 VITALS — BP 100/78 | HR 103 | Temp 98.5°F | Ht 66.75 in | Wt 228.5 lb

## 2015-06-25 DIAGNOSIS — IMO0001 Reserved for inherently not codable concepts without codable children: Secondary | ICD-10-CM

## 2015-06-25 DIAGNOSIS — E1165 Type 2 diabetes mellitus with hyperglycemia: Secondary | ICD-10-CM

## 2015-06-25 DIAGNOSIS — M1652 Unilateral post-traumatic osteoarthritis, left hip: Secondary | ICD-10-CM

## 2015-06-25 DIAGNOSIS — R9431 Abnormal electrocardiogram [ECG] [EKG]: Secondary | ICD-10-CM

## 2015-06-25 DIAGNOSIS — E785 Hyperlipidemia, unspecified: Secondary | ICD-10-CM

## 2015-06-25 DIAGNOSIS — R0602 Shortness of breath: Secondary | ICD-10-CM

## 2015-06-25 NOTE — Patient Instructions (Signed)

## 2015-06-25 NOTE — Progress Notes (Signed)
Dr. Frederico Hamman T. Yaneisy Wenz, MD, [facility] Primary Care and Sports Medicine [place], [address] Phone: [phone] Fax: [phone]  06/25/2015  Patient: Micheal Clark, MRN: [id number], DOB: 07/06/1955, 60 y.o.  Primary Physician:  Owens Loffler, MD   Chief Complaint  Patient presents with  . Shortness of Breath   Subjective:   Micheal Clark is a 60 y.o. very pleasant male patient who presents with the following:  Pleasant gentleman with end-stage left posttraumatic hip osteoarthritis with worsening shortness of breath on exertion over the last 2 weeks.  Without exercise or exertion, he is not having any significant shortness of breath.  Or at least minimal.  He has never been a regular smoker other than smoking for an occasional cigarette for less than a year as a young man.  He is never been a regular  Marijuana or cocaine smoker.  Cardiac risk factors include type 2 diabetes.  Uncontrolled on recent check.  Labs pending. Hyperlipidemia on statin. Obesity. Smokeless tobacco use. No known significant family history.  SOB x 2 weeks, has not been exercising.  Never was a regular smoker.  Feels like breathing really hard.   No sick or runny noses.  All just SOB  Past Medical History, Surgical History, Social History, Family History, Problem List, Medications, and Allergies have been reviewed and updated if relevant.  Patient Active Problem List   Diagnosis Date Noted  . Personal history of adenomatous colonic polyps 11/24/2011  . Diabetes type 2, uncontrolled ([place]) 11/15/2011  . Hyperlipidemia LDL goal <70 11/15/2011  . Smokeless tobacco use 10/11/2011  . Obesity 10/11/2011    Past Medical History  Diagnosis Date  . Smokeless tobacco use 10/11/2011  . Diabetes mellitus without complication ([place])   . Diabetes type 2, uncontrolled ([place]) 11/15/2011  . Hyperlipidemia 11/15/2011  . Personal history of adenomatous colonic polyps 11/24/2011     Past Surgical History  Procedure Laterality Date  . Total hip arthroplasty  1998    left  . Wisdom tooth extraction      Social History   Social History  . Marital Status: Married    Spouse Name: N/A  . Number of Children: 2  . Years of Education: N/A   Occupational History  . [employer]     truck driver   Social History Main Topics  . Smoking status: Former Research scientist (life sciences)  . Smokeless tobacco: Current User    Types: Chew     Comment: quit 1993  . Alcohol Use: Yes     Comment: occassionally-not wekly  . Drug Use: No  . Sexual Activity: Not on file   Other Topics Concern  . Not on file   Social History Narrative    Family History  Problem Relation Age of Onset  . Diabetes Mother   . Hypertension Brother   . Vision loss Paternal Grandfather   . Colon cancer Neg Hx   . Rectal cancer Neg Hx   . Stomach cancer Neg Hx     No Known Allergies  Medication list reviewed and updated in full in [place].   GEN: No acute illnesses, no fevers, chills. GI: No n/v/d, eating normally Pulm: + SOB Interactive and getting along well at home.  Otherwise, ROS is as per the HPI.  Objective:   BP 100/78 mmHg  Pulse 103  Temp(Src) 98.5 F (36.9 C) (Oral)  Ht 5' 6.75" (1.695 m)  Wt 228 lb 8 oz (103.647 kg)  BMI 36.08 kg/m2  SpO2 95%  GEN: WDWN, NAD, Non-toxic, A & O x 3 HEENT: Atraumatic, Normocephalic. Neck supple. No masses, No LAD. Ears and Nose: No external deformity. CV: RRR, No M/G/R. No JVD. No thrill. No extra heart sounds. PULM: CTA B, no wheezes, crackles, rhonchi. No retractions. No resp. distress. No accessory muscle use. EXTR: No c/c/e NEURO Normal gait.  PSYCH: Normally interactive. Conversant. Not depressed or anxious appearing.  Calm demeanor.   Laboratory and Imaging Data:  Assessment and Plan:   SOB (shortness of breath) on exertion - Plan: EKG 12-Lead, CBC with Differential/Platelet, Ambulatory referral to Cardiology  Uncontrolled  type 2 diabetes mellitus without complication, without long-term current use of insulin (HCC) - Plan: Hemoglobin 123456, Basic metabolic panel, Ambulatory referral to Cardiology  Hyperlipidemia LDL goal <70 - Plan: Ambulatory referral to Cardiology  Nonspecific abnormal electrocardiogram (ECG) (EKG) - Plan: Ambulatory referral to Cardiology  Post-traumatic osteoarthritis of left hip  EKG: Normal sinus rhythm. Normal axis, normal R wave progression, RBBB. Some leads contain T-wave inversion.  No clear ST depression or elevation.  New-onset dyspnea on exertion without a significant pulmonary history and without a significant smoking history, consult cardiology for concern for potential cardiac cause.  Obtain basic laboratories as below.  We also discussed potential total hip arthroplasty on the left, and I had previously discuss this with  Some of my surgical colleagues. Complex case given his prior acetabular fracture.   We would need to have him see someone with fellowship training in complex hip replacement.  Prior issues above take precedence.  Orders Placed This Encounter  Procedures  . Hemoglobin A1c  . Basic metabolic panel  . CBC with Differential/Platelet  . Ambulatory referral to Cardiology  . EKG 12-Lead    Signed,  Dayshon Roback T. Dangelo Guzzetta, MD   Patient's Medications  New Prescriptions   No medications on file  Previous Medications   METFORMIN (GLUCOPHAGE-XR) 500 MG 24 HR TABLET    Take 4 tablets (2,000 mg total) by mouth daily with breakfast.   SIMVASTATIN (ZOCOR) 40 MG TABLET    TAKE 1 TABLET (40 MG TOTAL) BY MOUTH AT BEDTIME.  Modified Medications   No medications on file  Discontinued Medications   No medications on file

## 2015-06-25 NOTE — Progress Notes (Signed)
Pre visit review using our clinic review tool, if applicable. No additional management support is needed unless otherwise documented below in the visit note. 

## 2015-06-25 NOTE — Telephone Encounter (Signed)
Patient Name: Micheal Clark DOB: [birth year] Initial Comment Caller states he would like to make an appt. He is having shortness of breath. Nurse Assessment Nurse: Ronnald Ramp, RN, Miranda Date/Time (Eastern Time): 06/25/2015 2:02:17 PM Confirm and document reason for call. If symptomatic, describe symptoms. You must click the next button to save text entered. ---Caller states he has been having SOB with activity for a couple of weeks. Has the patient traveled out of the country within the last 30 days? ---No Does the patient have any new or worsening symptoms? ---Yes Will a triage be completed? ---Yes Related visit to physician within the last 2 weeks? ---No Does the PT have any chronic conditions? (i.e. diabetes, asthma, etc.) ---Yes List chronic conditions. ---Diabetes, High Cholesterol Is this a behavioral health or substance abuse call? ---No Guidelines Guideline Title Affirmed Question Affirmed Notes Breathing Difficulty [1] MILD difficulty breathing (e.g., minimal/no SOB at rest, SOB with walking, pulse <100) AND [2] NEW-onset or WORSE than normal Final Disposition User See Physician within 4 Hours (or PCP triage) Ronnald Ramp, RN, Miranda Comments Appt scheduled for 4pm today with Dr. Edilia Bo Referrals REFERRED TO PCP OFFICE Disagree/Comply: Comply

## 2015-06-25 NOTE — Telephone Encounter (Signed)
TH scheduled appt with Dr Lorelei Pont 06/25/15 at 4 pm.

## 2015-06-26 DIAGNOSIS — M1652 Unilateral post-traumatic osteoarthritis, left hip: Secondary | ICD-10-CM | POA: Insufficient documentation

## 2015-06-26 LAB — CBC WITH DIFFERENTIAL/PLATELET
BASOS PCT: 0.5 % (ref 0.0–3.0)
Basophils Absolute: 0.1 10*3/uL (ref 0.0–0.1)
EOS ABS: 0.1 10*3/uL (ref 0.0–0.7)
Eosinophils Relative: 0.9 % (ref 0.0–5.0)
HCT: 48.1 % (ref 39.0–52.0)
HEMOGLOBIN: 16.1 g/dL (ref 13.0–17.0)
Lymphocytes Relative: 22.3 % (ref 12.0–46.0)
Lymphs Abs: 2.5 10*3/uL (ref 0.7–4.0)
MCHC: 33.5 g/dL (ref 30.0–36.0)
MCV: 90.2 fl (ref 78.0–100.0)
MONO ABS: 0.4 10*3/uL (ref 0.1–1.0)
Monocytes Relative: 3.4 % (ref 3.0–12.0)
NEUTROS PCT: 72.9 % (ref 43.0–77.0)
Neutro Abs: 8.2 10*3/uL — ABNORMAL HIGH (ref 1.4–7.7)
Platelets: 257 10*3/uL (ref 150.0–400.0)
RBC: 5.33 Mil/uL (ref 4.22–5.81)
RDW: 12.8 % (ref 11.5–15.5)
WBC: 11.2 10*3/uL — AB (ref 4.0–10.5)

## 2015-06-26 LAB — BASIC METABOLIC PANEL
BUN: 13 mg/dL (ref 6–23)
CALCIUM: 9.8 mg/dL (ref 8.4–10.5)
CHLORIDE: 100 meq/L (ref 96–112)
CO2: 27 meq/L (ref 19–32)
CREATININE: 0.92 mg/dL (ref 0.40–1.50)
GFR: 89.67 mL/min (ref >60.00)
GLUCOSE: 259 mg/dL — AB (ref 70–99)
Potassium: 4.6 mEq/L (ref 3.5–5.1)
Sodium: 137 mEq/L (ref 135–145)

## 2015-06-26 LAB — HEMOGLOBIN A1C: HEMOGLOBIN A1C: 9.5 % — AB (ref 4.6–6.5)

## 2015-07-11 ENCOUNTER — Ambulatory Visit: Payer: Self-pay | Admitting: Cardiovascular Disease

## 2015-09-05 ENCOUNTER — Other Ambulatory Visit: Payer: Self-pay | Admitting: Family Medicine

## 2015-09-24 ENCOUNTER — Encounter: Payer: Self-pay | Admitting: Family Medicine

## 2015-09-24 ENCOUNTER — Ambulatory Visit (INDEPENDENT_AMBULATORY_CARE_PROVIDER_SITE_OTHER): Payer: Self-pay | Admitting: Family Medicine

## 2015-09-24 VITALS — BP 120/78 | HR 117 | Temp 99.4°F | Ht 66.75 in | Wt 218.0 lb

## 2015-09-24 DIAGNOSIS — K089 Disorder of teeth and supporting structures, unspecified: Secondary | ICD-10-CM

## 2015-09-24 DIAGNOSIS — R6889 Other general symptoms and signs: Secondary | ICD-10-CM

## 2015-09-24 DIAGNOSIS — R509 Fever, unspecified: Secondary | ICD-10-CM

## 2015-09-24 LAB — POCT INFLUENZA A/B
INFLUENZA A, POC: NEGATIVE
Influenza B, POC: NEGATIVE

## 2015-09-24 MED ORDER — AMOXICILLIN 875 MG PO TABS: 875.0000 mg | ORAL_TABLET | Freq: Two times a day (BID) | ORAL | 0 refills | Status: DC

## 2015-09-24 NOTE — Progress Notes (Signed)
Pre visit review using our clinic review tool, if applicable. No additional management support is needed unless otherwise documented below in the visit note. 

## 2015-09-24 NOTE — Progress Notes (Signed)
Dr. Frederico Hamman T. Ismelda Weatherman, MD, [facility] Primary Care and Sports Medicine [place], [address] Phone: [phone] Fax: [phone]  09/24/2015  Patient: Micheal Clark, MRN: [id number], DOB: [birth year], 60 y.o.  Primary Physician:  Owens Loffler, MD   Chief Complaint  Patient presents with  . Chills    & Sweats-Started Friday  . Fever  . Abdominal Pain  . Shortness of Breath  . Fatigue   Subjective:   Micheal Clark is a 60 y.o. very pleasant male patient who presents with the following:  Started to feel sick last Friday.  Decreased appetite.  Threw up once Monday - and now 3rd time today.   102.7 last night Having a lot of aches, chills, and sweats.  Not coughing all that much.  Belly hurting just a little bit.   Past Medical History, Surgical History, Social History, Family History, Problem List, Medications, and Allergies have been reviewed and updated if relevant.  Patient Active Problem List   Diagnosis Date Noted  . Post-traumatic osteoarthritis of left hip 06/26/2015  . Personal history of adenomatous colonic polyps 11/24/2011  . Diabetes mellitus type 2, uncontrolled, without complications ([place]) [id number]  . Hyperlipidemia LDL goal <70 11/15/2011  . Smokeless tobacco use 10/11/2011  . Obesity 10/11/2011    Past Medical History:  Diagnosis Date  . Diabetes mellitus without complication ([place])   . Diabetes type 2, uncontrolled ([place]) 11/15/2011  . Hyperlipidemia 11/15/2011  . Personal history of adenomatous colonic polyps 11/24/2011  . Smokeless tobacco use 10/11/2011    Past Surgical History:  Procedure Laterality Date  . ORIF ACETABULAR FRACTURE Left 1998   left  . WISDOM TOOTH EXTRACTION      Social History   Social History  . Marital status: Married    Spouse name: N/A  . Number of children: 2  . Years of education: N/A   Occupational History  . [employer]     truck driver   Social  History Main Topics  . Smoking status: Former Research scientist (life sciences)  . Smokeless tobacco: Current User    Types: Chew     Comment: quit 1993  . Alcohol use Yes     Comment: occassionally-not wekly  . Drug use: No  . Sexual activity: Not on file   Other Topics Concern  . Not on file   Social History Narrative  . No narrative on file    Family History  Problem Relation Age of Onset  . Diabetes Mother   . Hypertension Brother   . Vision loss Paternal Grandfather   . Colon cancer Neg Hx   . Rectal cancer Neg Hx   . Stomach cancer Neg Hx     No Known Allergies  Medication list reviewed and updated in full in [place].  ROS: GEN: Acute illness details above GI: Tolerating PO intake GU: maintaining adequate hydration and urination Pulm: No SOB Interactive and getting along well at home.  Otherwise, ROS is as per the HPI.  Objective:   BP 120/78   Pulse (!) 117   Temp 99.4 F (37.4 C) (Oral)   Ht 5' 6.75" (1.695 m)   Wt 218 lb (98.9 kg)   SpO2 100%   BMI 34.40 kg/m    Gen: WDWN, NAD; A & O x3, cooperative. Pleasant.Globally Non-toxic HEENT: Normocephalic and atraumatic. Throat clear, w/o exudate, R TM clear, L TM - good landmarks, No fluid present. rhinnorhea. No frontal or maxillary  sinus T. MMM. Extremely poor dentition. NECK: Anterior cervical  LAD is present CV: RRR, No M/G/R, cap refill <2 sec PULM: Breathing comfortably in no respiratory distress. no wheezing, crackles, rhonchi ABD: S,NT,ND,+BS. No HSM. No rebound. EXT: No c/c/e PSYCH: Friendly, good eye contact MSK: Nml gait    Laboratory and Imaging Data: Results for orders placed or performed in visit on 09/24/15  POCT Influenza A/B  Result Value Ref Range   Influenza A, POC Negative Negative   Influenza B, POC Negative Negative     Assessment and Plan:   Flu-like symptoms  Fever, unspecified - Plan: POCT Influenza A/B  Poor dentition  Flu-like illness.  Decreased appetite - push fluids,  anti-pyretics  With increased dental pain and very poor dentition will add PCN based abx  Follow-up: if worsening over the next few days  New Prescriptions   AMOXICILLIN (AMOXIL) 875 MG TABLET    Take 1 tablet (875 mg total) by mouth 2 (two) times daily.   Modified Medications   No medications on file   Orders Placed This Encounter  Procedures  . POCT Influenza A/B    Signed,  Harryette Shuart T. Jaanai Salemi, MD   Patient's Medications  New Prescriptions   AMOXICILLIN (AMOXIL) 875 MG TABLET    Take 1 tablet (875 mg total) by mouth 2 (two) times daily.  Previous Medications   METFORMIN (GLUCOPHAGE-XR) 500 MG 24 HR TABLET    TAKE 4 TABLETS (2,000 MG TOTAL) BY MOUTH DAILY WITH BREAKFAST.   SIMVASTATIN (ZOCOR) 40 MG TABLET    TAKE 1 TABLET (40 MG TOTAL) BY MOUTH AT BEDTIME.  Modified Medications   No medications on file  Discontinued Medications   No medications on file

## 2015-09-30 ENCOUNTER — Ambulatory Visit (INDEPENDENT_AMBULATORY_CARE_PROVIDER_SITE_OTHER): Payer: Self-pay | Admitting: Family Medicine

## 2015-09-30 ENCOUNTER — Encounter: Payer: Self-pay | Admitting: Family Medicine

## 2015-09-30 DIAGNOSIS — J069 Acute upper respiratory infection, unspecified: Secondary | ICD-10-CM

## 2015-09-30 NOTE — Patient Instructions (Addendum)
See Rosaria Ferries on the way out, about your referral to cardiology.   Rest and fluids, continue amoxil.  Meclizine is reasonable as needed for the motion sickness sensation.  Take care.  Glad to see you.

## 2015-09-30 NOTE — Progress Notes (Signed)
1.5-2 weeks of illness.  Started on amoxil at last OV. Since last OV- he is not vomiting now.  Some diarrhea.  Still with dental pain, lower incisors, some better in the meantime.  Still with sensation of motion sickness, no sensation of vertigo but feels "woozy" and unsettled.  No more fevers.  Tried dramamine, with some relief but his mouth has been dry (unclear if med related). No facial pain.   Hasn't been on other cough, cold medicines.  Last fever was about 4 days ago.    He had to cancel his cardiology appointment; hasn't rescheduled yet.   Meds, vitals, and allergies reviewed.   ROS: Per HPI unless specifically indicated in ROS section   GEN: nad, alert and oriented HEENT: mucous membranes moist, tm w/o erythema, nasal exam w/o erythema, clear discharge noted,  OP with cobblestoning NECK: supple w/o LA CV: rrr.   PULM: ctab, no inc wob EXT: no edema SKIN: no acute rash

## 2015-09-30 NOTE — Progress Notes (Signed)
Pre visit review using our clinic review tool, if applicable. No additional management support is needed unless otherwise documented below in the visit note. 

## 2015-10-01 DIAGNOSIS — J069 Acute upper respiratory infection, unspecified: Secondary | ICD-10-CM | POA: Insufficient documentation

## 2015-10-01 NOTE — Assessment & Plan Note (Signed)
Nontoxic.  D/w pt.  Likely some better overall in the meantime.  Reasonable to try meclizine to see if it doesn't dry his mouth as much.  D/w pt.  Can try 12.5-25mg  3 times a day as needed, sedation caution.  O/w continue amoxil for now.  F/u prn.  Work note given to patient.  He agrees.

## 2015-10-06 ENCOUNTER — Other Ambulatory Visit: Payer: Self-pay | Admitting: Family Medicine

## 2015-10-06 DIAGNOSIS — E1165 Type 2 diabetes mellitus with hyperglycemia: Secondary | ICD-10-CM

## 2015-10-06 DIAGNOSIS — E785 Hyperlipidemia, unspecified: Secondary | ICD-10-CM

## 2015-10-06 DIAGNOSIS — IMO0001 Reserved for inherently not codable concepts without codable children: Secondary | ICD-10-CM

## 2015-10-06 DIAGNOSIS — R9431 Abnormal electrocardiogram [ECG] [EKG]: Secondary | ICD-10-CM

## 2015-10-06 DIAGNOSIS — R0602 Shortness of breath: Secondary | ICD-10-CM

## 2015-10-07 ENCOUNTER — Encounter: Payer: Self-pay | Admitting: Internal Medicine

## 2015-10-07 ENCOUNTER — Ambulatory Visit (INDEPENDENT_AMBULATORY_CARE_PROVIDER_SITE_OTHER): Payer: Self-pay | Admitting: Internal Medicine

## 2015-10-07 VITALS — BP 118/72 | HR 93 | Ht 68.0 in | Wt 219.2 lb

## 2015-10-07 DIAGNOSIS — R0602 Shortness of breath: Secondary | ICD-10-CM

## 2015-10-07 DIAGNOSIS — E785 Hyperlipidemia, unspecified: Secondary | ICD-10-CM

## 2015-10-07 NOTE — Progress Notes (Signed)
New Outpatient Visit Date: 10/07/2015  Referring Provider: Owens Loffler, MD [facility] [address] [place], [place] [address]  Chief Complaint  Patient presents with  . Other    Ref by Dr. Lorelei Pont for shortness of breath on exertion. Meds reviewed by the patient verbally. "doing well."    HPI:  Micheal Clark is a 60 y.o. year-old male with history of type 2 diabetes mellitus, hyperlipidemia, and tobacco use, who has been referred by Dr. Lorelei Pont for evaluation of shortness of breath and abnormal EKG. Patient first reported dyspnea in 05/2015 to his PCP. EKG at that time showed sinus rhythm with right bundle branch block. He has not undergone further cardiac workup. Of note, Micheal Clark had been feeling unwell for the last 2 weeks, with primarily GI symptoms including nausea, vomiting, and diarrhea.    Today, Micheal Clark reports that he has been feeling better and is getting over his recent GI illness. He continues to have exertional dyspnea when walking 100 yards. He also has occasional lightheadedness that accompanies his dyspnea. This resolves with rest over the course of 5-10 minutes. He denies Chest pain, palpitations, claudication, lower extremity edema, orthopnea, and PND. His wife notes that Micheal Clark falls asleep very easily and snores often at night. He is occasionally restless when going to bed but feels like he is refreshed upon awakening in the morning.  --------------------------------------------------------------------------------------------------  Past Medical History:  Diagnosis Date  . Diabetes mellitus without complication ([place])   . Diabetes type 2, uncontrolled ([place]) 11/15/2011  . Hyperlipidemia 11/15/2011  . Personal history of adenomatous colonic polyps 11/24/2011  . Smokeless tobacco use 10/11/2011    Past Surgical History:  Procedure Laterality Date  . ORIF ACETABULAR FRACTURE Left 1998   left  . WISDOM TOOTH EXTRACTION      Outpatient  Encounter Prescriptions as of 10/07/2015  Medication Sig  . aspirin 81 MG tablet Take 81 mg by mouth daily.  . metFORMIN (GLUCOPHAGE-XR) 500 MG 24 hr tablet TAKE 4 TABLETS (2,000 MG TOTAL) BY MOUTH DAILY WITH BREAKFAST.  Marland Kitchen simvastatin (ZOCOR) 40 MG tablet TAKE 1 TABLET (40 MG TOTAL) BY MOUTH AT BEDTIME.  . [DISCONTINUED] amoxicillin (AMOXIL) 875 MG tablet Take 1 tablet (875 mg total) by mouth 2 (two) times daily. (Patient not taking: Reported on 10/07/2015)   No facility-administered encounter medications on file as of 10/07/2015.     Allergies: Review of patient's allergies indicates no known allergies.  Social History   Social History  . Marital status: Married    Spouse name: N/A  . Number of children: 2  . Years of education: N/A   Occupational History  . [employer]     truck driver   Social History Main Topics  . Smoking status: Former Smoker    Packs/day: 1.00    Years: 8.00    Types: Cigarettes    Quit date: 03/08/1980  . Smokeless tobacco: Current User    Types: Chew     Comment: quit 1993  . Alcohol use Yes     Comment: occassionally-not wekly  . Drug use: No  . Sexual activity: Not on file   Other Topics Concern  . Not on file   Social History Narrative  . No narrative on file    Family History  Problem Relation Age of Onset  . Diabetes Mother   . Hypertension Brother   . Vision loss Paternal Grandfather   . Colon cancer Neg Hx   .  Rectal cancer Neg Hx   . Stomach cancer Neg Hx     Review of Systems: A 12-system review of systems was performed and was negative except as noted in the HPI.  --------------------------------------------------------------------------------------------------  Physical Exam: BP 118/72 (BP Location: Right Arm, Patient Position: Sitting, Cuff Size: Normal)   Pulse 93   Ht 5\' 8"  (1.727 m)   Wt 219 lb 4 oz (99.5 kg)   BMI 33.34 kg/m   General:  Obese man, seated comfortably in the exam room. He is  accompanied by his wife. HEENT: No conjunctival pallor or scleral icterus.  Moist mucous membranes.  OP clear. Neck: Supple without lymphadenopathy, thyromegaly, JVD, or HJR.  No carotid bruit. Lungs: Normal work of breathing.  Clear to auscultation bilaterally without wheezes or crackles. CV: Regular rate and rhythm without murmurs, rubs, or gallops.  Non-displaced PMI. Abd: Bowel sounds present.  Soft, NT/ND without hepatosplenomegaly Ext: No lower extremity edema.  Radial, PT, and DP pulses are 2+ bilaterally Skin: warm and dry without rash Psych: Normal mood and affect.  EKG:  Normal sinus rhythm with right bundle branch block and left axis deviation. No significant change from prior tracing on 06/25/15.   Lab Results  Component Value Date   WBC 11.2 (H) 06/25/2015   HGB 16.1 06/25/2015   HCT 48.1 06/25/2015   MCV 90.2 06/25/2015   PLT 257.0 06/25/2015    Lab Results  Component Value Date   NA 137 06/25/2015   K 4.6 06/25/2015   CL 100 06/25/2015   CO2 27 06/25/2015   BUN 13 06/25/2015   CREATININE 0.92 06/25/2015   GLUCOSE 259 (H) 06/25/2015    Lab Results  Component Value Date   CHOL 176 08/26/2014   HDL 41.20 08/26/2014   LDLCALC 111 (H) 08/26/2014   LDLDIRECT 162.9 10/11/2011   TRIG 121.0 08/26/2014   CHOLHDL 4 08/26/2014    --------------------------------------------------------------------------------------------------  ASSESSMENT AND PLAN: 60 year old man with history of diabetes and tobacco use presenting for evaluation of exertional shortness of breath in the setting of abnormal EKG demonstrating right bundle branch block.  1. SOB (shortness of breath) on exertion This could be related to either cardiac or pulmonary cause. EKG is abnormal demonstrating right bundle branch block with left axis deviation but no obvious ischemic changes. We have discussed further workup and have agreed to obtain a transthoracic echocardiogram and exercise myocardial perfusion  stress test. In the meantime, the patient should remain on low-dose aspirin and simvastatin. If cardiac workup is unrevealing, the patient may benefit from pulmonary assessment, including sleep study, given his reported snoring and daytime fatigue. We will readdress this when the patient returns for follow-up. Micheal Clark was counseled to seek immediate medical attention should his shortness of breath worsen, particularly if it occurs at rest, or if he develops chest pain.  2. Hyperlipidemia We will obtain a fasting lipid panel when the patient returns for his stress test for further risk stratification and medical optimization. Ultimately, Micheal Clark may benefit from escalation of lipid therapy to a high-intensity statin.  Follow-up: Return to clinic in approximately one month to review results of echo and stress test.  Nelva Bush, MD 10/07/2015 10:45 AM

## 2015-10-07 NOTE — Patient Instructions (Addendum)
Medication Instructions:   START TAKING ASPIRIN 81 MG ONCE A DAY   If you need a refill on your cardiac medications before your next appointment, please call your pharmacy.  Labwork: FASTING LIPID AT [facility]   Testing/Procedures: Your physician has requested that you have an echocardiogram. Echocardiography is a painless test that uses sound waves to create images of your heart. It provides your doctor with information about the size and shape of your heart and how well your heart's chambers and valves are working. This procedure takes approximately one hour. There are no restrictions for this procedure.  SCHEDULED FOR 10/13/15.Your physician has requested that you have a lexiscan myoview. For further information please visit [email or web address]. Please follow instruction sheet, as given.  [place]  Your caregiver has ordered a Stress Test with nuclear imaging. The purpose of this test is to evaluate the blood supply to your heart muscle. This procedure is referred to as a "Non-Invasive Stress Test." This is because other than having an IV started in your vein, nothing is inserted or "invades" your body. Cardiac stress tests are done to find areas of poor blood flow to the heart by determining the extent of coronary artery disease (CAD). Some patients exercise on a treadmill, which naturally increases the blood flow to your heart, while others who are  unable to walk on a treadmill due to physical limitations have a pharmacologic/chemical stress agent called Lexiscan . This medicine will mimic walking on a treadmill by temporarily increasing your coronary blood flow.   Please note: these test may take anywhere between 2-4 hours to complete  PLEASE REPORT TO [facility] AT THE FIRST DESK WILL DIRECT YOU WHERE TO GO  Date of Procedure:_____________________________________  Arrival Time for Procedure:______________________________  Instructions regarding  medication:   ____ : Hold diabetes medication morning of procedure  ____:  Hold betablocker(s) night before procedure and morning of procedure  ____:  Hold other medications as follows:_________________________________________________________________________________________________________________________________________________________________________________________________________________________________________________________________________________________  PLEASE NOTIFY THE OFFICE AT LEAST 24 HOURS IN ADVANCE IF YOU ARE UNABLE TO KEEP YOUR APPOINTMENT.  [phone] AND  PLEASE NOTIFY NUCLEAR MEDICINE AT [facility] AT LEAST 24 HOURS IN ADVANCE IF YOU ARE UNABLE TO KEEP YOUR APPOINTMENT. [phone]  How to prepare for your Myoview test:  1. Do not eat or drink after midnight 2. No caffeine for 24 hours prior to test 3. No smoking 24 hours prior to test. 4. Your medication may be taken with water.  If your doctor stopped a medication because of this test, do not take that medication. 5. Ladies, please do not wear dresses.  Skirts or pants are appropriate. Please wear a short sleeve shirt. 6. No perfume, cologne or lotion. 7. Wear comfortable walking shoes. No heels!      Follow-Up: 2 TO 3 WEEKS after 10/13/15.Marland KitchenWITH DR Harrell Gave END    Any Other Special Instructions Will Be Listed Below (If Applicable).

## 2015-10-10 ENCOUNTER — Ambulatory Visit (INDEPENDENT_AMBULATORY_CARE_PROVIDER_SITE_OTHER): Payer: Self-pay

## 2015-10-10 ENCOUNTER — Other Ambulatory Visit: Payer: Self-pay

## 2015-10-10 ENCOUNTER — Telehealth: Payer: Self-pay | Admitting: Internal Medicine

## 2015-10-10 DIAGNOSIS — R0602 Shortness of breath: Secondary | ICD-10-CM

## 2015-10-10 NOTE — Telephone Encounter (Signed)
Pt in the office today for echo and had questions regarding fasting labs. Reviewed treadmill myoview and lab instructions w/pt who verbalized understanding. He had no further questions at this time.

## 2015-10-13 ENCOUNTER — Encounter
Admission: RE | Admit: 2015-10-13 | Discharge: 2015-10-13 | Disposition: A | Discharge status: Home / Self Care | Payer: Self-pay | Source: Ambulatory Visit | Attending: Internal Medicine | Admitting: Internal Medicine

## 2015-10-13 ENCOUNTER — Telehealth: Payer: Self-pay | Admitting: Internal Medicine

## 2015-10-13 DIAGNOSIS — R0602 Shortness of breath: Secondary | ICD-10-CM | POA: Insufficient documentation

## 2015-10-13 LAB — NM MYOCAR MULTI W/SPECT W/WALL MOTION / EF
CHL CUP NUCLEAR SDS: 0
CHL CUP NUCLEAR SRS: 2
CHL CUP RESTING HR STRESS: 72 {beats}/min
CSEPED: 7 min
CSEPEDS: 45 s
CSEPEW: 9.7 METS
CSEPHR: 88 %
CSEPPHR: 144 {beats}/min
LV dias vol: 84 mL (ref 62–150)
LV sys vol: 26 mL
NUC STRESS TID: 0.91
SSS: 0

## 2015-10-13 MED ORDER — TECHNETIUM TC 99M TETROFOSMIN IV KIT
30.0000 | PACK | Freq: Once | INTRAVENOUS | Status: AC | PRN
  Administered 2015-10-13: 28.513 via INTRAVENOUS

## 2015-10-13 MED ORDER — TECHNETIUM TC 99M TETROFOSMIN IV KIT
14.0300 | PACK | Freq: Once | INTRAVENOUS | Status: AC | PRN
  Administered 2015-10-13: 14.03 via INTRAVENOUS

## 2015-10-14 NOTE — Telephone Encounter (Signed)
Left message for patient regarding yesterday's stress test, which showed normal LVEF without evidence of ischemia or scar.  I suspect that his dyspnea and fatigue are non-cardiac in etiology.  It is fine for the patient to reschedule his visit to see me for 6 months.  However, if he prefers to see me in 1 month as previously arranged, he can keep that appointment.  Micheal Clark should also speak with his PCP about pulmonary evaluation for his symptoms.  Nelva Bush, MD [facility] Pager: [phone]

## 2015-10-15 NOTE — Telephone Encounter (Signed)
S/w pt who is agreeable to 55month f/u w/Dr. End and understands to f/u w/PCP regarding sx.

## 2015-10-29 ENCOUNTER — Ambulatory Visit: Payer: Self-pay | Admitting: Internal Medicine

## 2015-12-05 ENCOUNTER — Ambulatory Visit (INDEPENDENT_AMBULATORY_CARE_PROVIDER_SITE_OTHER): Payer: Self-pay | Admitting: Family Medicine

## 2015-12-05 ENCOUNTER — Encounter: Payer: Self-pay | Admitting: Family Medicine

## 2015-12-05 DIAGNOSIS — M545 Low back pain, unspecified: Secondary | ICD-10-CM

## 2015-12-05 MED ORDER — CYCLOBENZAPRINE HCL 10 MG PO TABS: 5.0000 mg | ORAL_TABLET | Freq: Every evening | ORAL | 0 refills | Status: DC | PRN

## 2015-12-05 MED ORDER — DICLOFENAC SODIUM 75 MG PO TBEC: 75.0000 mg | DELAYED_RELEASE_TABLET | Freq: Two times a day (BID) | ORAL | 0 refills | Status: DC

## 2015-12-05 NOTE — Patient Instructions (Signed)
Heat ,  Start gentle stretching.  Start diclofenac twice daily for pain and inflammation at least for 3-4 days up to 2 weeks.  Muscle relaxant at night as needed.  if not improving in 2 weeks follow up.

## 2015-12-05 NOTE — Progress Notes (Signed)
   Subjective:    Patient ID: Micheal Clark, male    DOB: 1955-03-15, 60 y.o.   MRN: [id number]  HPI   60 year old male presents with new onset back pain.   He reports  pain in last week.  No know injury, no fall.  Pain in right lower back,  No radiation.  No numbness , no weakness.  No fever. 4-5/10 on pain scale. Worse with lying down, better if up and moving. No difference between leaning forward and back.  No radiation of pain  Treatment: tried heating pad, no meds. Helped minimally.  No history of back issues, no past past surgery in back.    Review of Systems  Constitutional: Negative for fatigue.  HENT: Negative for ear pain.   Eyes: Negative for pain.  Respiratory: Negative for shortness of breath.   Cardiovascular: Negative for chest pain.  Gastrointestinal: Negative for abdominal distention.       Objective:   Physical Exam  Constitutional: Vital signs are normal. He appears well-developed and well-nourished.  HENT:  Head: Normocephalic.  Right Ear: Hearing normal.  Left Ear: Hearing normal.  Nose: Nose normal.  Mouth/Throat: Oropharynx is clear and moist and mucous membranes are normal.  Neck: Trachea normal. Carotid bruit is not present. No thyroid mass and no thyromegaly present.  Cardiovascular: Normal rate, regular rhythm and normal pulses.  Exam reveals no gallop, no distant heart sounds and no friction rub.   No murmur heard. No peripheral edema  Pulmonary/Chest: Effort normal and breath sounds normal. No respiratory distress.  Musculoskeletal:       Lumbar back: He exhibits tenderness. He exhibits normal range of motion and no bony tenderness.  Neg SLR , neg faber's  Skin: Skin is warm, dry and intact. No rash noted.  Psychiatric: He has a normal mood and affect. His speech is normal and behavior is normal. Thought content normal.          Assessment & Plan:

## 2015-12-05 NOTE — Progress Notes (Signed)
Pre visit review using our clinic review tool, if applicable. No additional management support is needed unless otherwise documented below in the visit note. 

## 2016-01-03 ENCOUNTER — Other Ambulatory Visit: Payer: Self-pay | Admitting: Family Medicine

## 2016-01-04 NOTE — Telephone Encounter (Signed)
Last office visit 12/05/15 with Dr. Diona Browner for back pain. Last Lipid checked 08/26/14.  Refill?

## 2016-02-02 ENCOUNTER — Other Ambulatory Visit: Payer: Self-pay | Admitting: Family Medicine

## 2016-02-05 DIAGNOSIS — M545 Low back pain, unspecified: Secondary | ICD-10-CM | POA: Insufficient documentation

## 2016-02-05 NOTE — Assessment & Plan Note (Addendum)
NSAIDs, heat, massaage and start home PT. Can use muscle relaxant as needed.

## 2016-06-02 ENCOUNTER — Encounter: Payer: Self-pay | Admitting: Primary Care

## 2016-06-02 ENCOUNTER — Ambulatory Visit (INDEPENDENT_AMBULATORY_CARE_PROVIDER_SITE_OTHER): Payer: Self-pay | Admitting: Primary Care

## 2016-06-02 VITALS — BP 130/90 | HR 78 | Temp 98.4°F | Ht 68.0 in | Wt 227.5 lb

## 2016-06-02 DIAGNOSIS — R202 Paresthesia of skin: Secondary | ICD-10-CM

## 2016-06-02 MED ORDER — VALACYCLOVIR HCL 1 G PO TABS: 1000.0000 mg | ORAL_TABLET | Freq: Three times a day (TID) | ORAL | 0 refills | Status: DC

## 2016-06-02 NOTE — Progress Notes (Signed)
Pre visit review using our clinic review tool, if applicable. No additional management support is needed unless otherwise documented below in the visit note. 

## 2016-06-02 NOTE — Patient Instructions (Signed)
Start valacyclovir 1 gm tablets. Take 1 tablet by mouth three times daily for seven days.  You may take Ibuprofen 600 mg three times daily as needed for pain and inflammation.  Please notify me if no improvement in 3-4 days.  It was a pleasure meeting you!

## 2016-06-02 NOTE — Progress Notes (Signed)
Subjective:    Patient ID: Micheal Clark, male    DOB: [birth year], 61 y.o.   MRN: [id number]  HPI  Micheal Clark is a 61 year old male with a history of osteoarthritis who presents today with a chief complaint of lateral left chest wall pain that he first noticed 10 days ago. He describes his pain as sharp, pins and needles. His pain is worse when driving in his truck and hits bumps. He's also noticed numbness to the skin of his left upper abdomen just medial to the site of pain. He denies recent injury/trauma, heavy lifting, rashes, nausea, vomiting, abdominal pain, urinary symptoms, constipation, diarrhea. He's not taken anything OTC for his symptoms. He did have chicken pox as a child.  Review of Systems  Constitutional: Negative for fever.  Genitourinary: Negative for dysuria, frequency and hematuria.  Musculoskeletal: Negative for back pain.  Skin: Negative for rash.  Neurological:       Tingling and numbness to left upper abdomen.       Past Medical History:  Diagnosis Date  . Diabetes mellitus without complication ([place])   . Diabetes type 2, uncontrolled ([place]) 11/15/2011  . Hyperlipidemia 11/15/2011  . Personal history of adenomatous colonic polyps 11/24/2011  . Smokeless tobacco use 10/11/2011     Social History   Social History  . Marital status: Married    Spouse name: N/A  . Number of children: 2  . Years of education: N/A   Occupational History  . [employer]     truck driver   Social History Main Topics  . Smoking status: Former Smoker    Packs/day: 1.00    Years: 8.00    Types: Cigarettes    Quit date: 03/08/1980  . Smokeless tobacco: Current User    Types: Chew     Comment: quit 1993  . Alcohol use Yes     Comment: occassionally-not wekly  . Drug use: No  . Sexual activity: Not on file   Other Topics Concern  . Not on file   Social History Narrative  . No narrative on file    Past Surgical History:  Procedure Laterality  Date  . ORIF ACETABULAR FRACTURE Left 1998   left  . WISDOM TOOTH EXTRACTION      Family History  Problem Relation Age of Onset  . Diabetes Mother   . Hypertension Brother   . Vision loss Paternal Grandfather   . Colon cancer Neg Hx   . Rectal cancer Neg Hx   . Stomach cancer Neg Hx     No Known Allergies  Current Outpatient Prescriptions on File Prior to Visit  Medication Sig Dispense Refill  . aspirin 81 MG tablet Take 81 mg by mouth daily.    . metFORMIN (GLUCOPHAGE-XR) 500 MG 24 hr tablet TAKE FOUR TABLETS (2,000 MG TOTAL) BY MOUTH EVERY MORNING WITH BREAKFAST 120 tablet 3  . simvastatin (ZOCOR) 40 MG tablet TAKE 1 TABLET (40 MG TOTAL) BY MOUTH AT BEDTIME. 30 tablet 2   No current facility-administered medications on file prior to visit.     BP 130/90   Pulse 78   Temp 98.4 F (36.9 C) (Oral)   Ht 5\' 8"  (1.727 m)   Wt 227 lb 8 oz (103.2 kg)   BMI 34.59 kg/m    Objective:   Physical Exam  Constitutional: He appears well-nourished.  Neck: Neck supple.  Cardiovascular: Normal rate and regular rhythm.   Pulmonary/Chest: Effort normal and breath  sounds normal.  Abdominal: Soft. Bowel sounds are normal. There is no tenderness. There is no CVA tenderness.  Musculoskeletal: Normal range of motion.  Skin: Skin is warm and dry. No rash noted.  Tender to left lateral chest wall at level of T6. Numbness to left anterior abdomen medial to tenderness.          Assessment & Plan:  Herpes Zoster:  Tingling, lateral chest wall tenderness. Symptoms follow a dermatone pattern on the left side. No rash. Good ROM to trunk and lumbar spine. Given lack of GI/GU/Cardiac symptoms highly suspect this is early herpes zoster.  Rx for Valtrex course sent to pharmacy. Discussed use of Ibuprofen PRN. He will update if symptoms progress/no improvement.  Sheral Flow, NP

## 2016-06-07 ENCOUNTER — Other Ambulatory Visit: Payer: Self-pay | Admitting: Family Medicine

## 2016-06-08 ENCOUNTER — Telehealth: Payer: Self-pay | Admitting: *Deleted

## 2016-06-08 NOTE — Telephone Encounter (Signed)
Yes recommend return for re-eval.

## 2016-06-08 NOTE — Telephone Encounter (Signed)
Pt called back; pt seen 06/02/16; pt has been taking valtrex as instructed with no improvement with numbness and pain in lt side; pain level now is 3. No rash and pt request cb with what to do next. [employer].

## 2016-06-08 NOTE — Telephone Encounter (Signed)
Patient left a voicemail stating that he saw Allie Bossier NP last week and his symptoms are the same with no improvement. Patient wants to know if he needs to come back in to be re-evaluated?

## 2016-06-08 NOTE — Telephone Encounter (Signed)
Left message for Mr. Coye to call the office and schedule an appointment to be re-evaluated per Dr. Diona Browner.

## 2016-06-08 NOTE — Telephone Encounter (Signed)
Appointment 5/17

## 2016-06-10 ENCOUNTER — Ambulatory Visit (INDEPENDENT_AMBULATORY_CARE_PROVIDER_SITE_OTHER): Payer: Self-pay | Admitting: Family Medicine

## 2016-06-10 ENCOUNTER — Encounter: Payer: Self-pay | Admitting: Family Medicine

## 2016-06-10 VITALS — BP 130/90 | HR 84 | Temp 98.6°F | Ht 68.0 in | Wt 227.5 lb

## 2016-06-10 DIAGNOSIS — R200 Anesthesia of skin: Secondary | ICD-10-CM

## 2016-06-10 DIAGNOSIS — R1012 Left upper quadrant pain: Secondary | ICD-10-CM

## 2016-06-10 DIAGNOSIS — R202 Paresthesia of skin: Secondary | ICD-10-CM | POA: Insufficient documentation

## 2016-06-10 DIAGNOSIS — IMO0001 Reserved for inherently not codable concepts without codable children: Secondary | ICD-10-CM

## 2016-06-10 DIAGNOSIS — E1165 Type 2 diabetes mellitus with hyperglycemia: Secondary | ICD-10-CM

## 2016-06-10 MED ORDER — DICLOFENAC SODIUM 75 MG PO TBEC: 75.0000 mg | DELAYED_RELEASE_TABLET | Freq: Two times a day (BID) | ORAL | 0 refills | Status: DC

## 2016-06-10 NOTE — Progress Notes (Signed)
   Subjective:    Patient ID: Micheal Clark, male    DOB: [birth year], 61 y.o.   MRN: [id number]  HPI   61 year old male pt presents for follow up. He was last seen on 5/9 by Allie Bossier for left lateral abdominal wall pain with pins and needles. Also noted numbness in left upper abdomen.  Symptoms were in dermatomal pattern , but no rash.  Felt at  The time that it was early herpes zoster.  Started on course of valtrex and recommended ibuprofen 600mg  for pain control.  Since then he reports: He has completed valtrex, no appearance of rash and  ain is the same. 3/10 on pain scale with occ sharp pain. No clear triggers. More numb and itchy in left upper quadrant. No fever. No numbness  Or weakness in legs and arms.   No hx of back issues.  No recent new issues.  DM, moderate control ...  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 Review of Systems  Constitutional: Positive for fatigue. Negative for fever.  Respiratory: Negative for shortness of breath.   Cardiovascular: Negative for chest pain, palpitations and leg swelling.  Gastrointestinal: Positive for diarrhea. Negative for abdominal pain and anal bleeding.  Genitourinary: Negative for dysuria.  Musculoskeletal: Negative for back pain and neck pain.       Objective:   Physical Exam  Constitutional: Vital signs are normal. He appears well-developed and well-nourished.  overweight  HENT:  Head: Normocephalic.  Right Ear: Hearing normal.  Left Ear: Hearing normal.  Nose: Nose normal.  Mouth/Throat: Oropharynx is clear and moist and mucous membranes are normal.  Neck: Trachea normal. Carotid bruit is not present. No thyroid mass and no thyromegaly present.  Cardiovascular: Normal rate, regular rhythm and normal pulses.  Exam reveals no gallop, no distant heart sounds and no friction rub.   No murmur heard. No peripheral edema    Pulmonary/Chest: Effort normal and breath sounds normal. No respiratory distress.  Abdominal: There is no hepatosplenomegaly. There is tenderness in the left upper quadrant. There is no rigidity, no guarding and no CVA tenderness.    Area of numbness... t6 dermatomal area  Musculoskeletal:       Thoracic back: He exhibits normal range of motion and no tenderness.  Skin: Skin is warm, dry and intact. No rash noted.  Psychiatric: He has a normal mood and affect. His speech is normal and behavior is normal. Thought content normal.          Assessment & Plan:

## 2016-06-10 NOTE — Patient Instructions (Addendum)
Follow up with Dr. Lorelei Pont DM control in next 2-3 weeks.  Stop ibuprofen.. Can use diclofenac twice daily for pain.  Please stop at the lab to set up to have labs drawn.

## 2016-06-10 NOTE — Assessment & Plan Note (Signed)
Unclear cause.  Eval with labs.  Zoster without rash still a possibility.. Needs better pain control... Will use diclofenac given additional possibility of thoarcic spine issue.  if labs neg. Consider T spine X-ray. Pt does walk with abn gait given left hip pain from past injury.

## 2016-06-11 LAB — CBC WITH DIFFERENTIAL/PLATELET
BASOS PCT: 0.9 % (ref 0.0–3.0)
Basophils Absolute: 0.1 10*3/uL (ref 0.0–0.1)
EOS ABS: 0.1 10*3/uL (ref 0.0–0.7)
Eosinophils Relative: 1.2 % (ref 0.0–5.0)
HEMATOCRIT: 43.6 % (ref 39.0–52.0)
Hemoglobin: 15.1 g/dL (ref 13.0–17.0)
LYMPHS ABS: 3.3 10*3/uL (ref 0.7–4.0)
Lymphocytes Relative: 35.5 % (ref 12.0–46.0)
MCHC: 34.6 g/dL (ref 30.0–36.0)
MCV: 90 fl (ref 78.0–100.0)
MONO ABS: 0.6 10*3/uL (ref 0.1–1.0)
Monocytes Relative: 6 % (ref 3.0–12.0)
Neutro Abs: 5.3 10*3/uL (ref 1.4–7.7)
Neutrophils Relative %: 56.4 % (ref 43.0–77.0)
Platelets: 241 10*3/uL (ref 150.0–400.0)
RBC: 4.84 Mil/uL (ref 4.22–5.81)
RDW: 12.6 % (ref 11.5–15.5)
WBC: 9.4 10*3/uL (ref 4.0–10.5)

## 2016-06-11 LAB — COMPREHENSIVE METABOLIC PANEL
ALBUMIN: 4.4 g/dL (ref 3.5–5.2)
ALT: 22 U/L (ref 0–53)
AST: 18 U/L (ref 0–37)
Alkaline Phosphatase: 61 U/L (ref 39–117)
BILIRUBIN TOTAL: 0.6 mg/dL (ref 0.2–1.2)
BUN: 11 mg/dL (ref 6–23)
CALCIUM: 9.7 mg/dL (ref 8.4–10.5)
CO2: 28 mEq/L (ref 19–32)
CREATININE: 0.83 mg/dL (ref 0.40–1.50)
Chloride: 105 mEq/L (ref 96–112)
GFR: 100.64 mL/min (ref >60.00)
Glucose, Bld: 178 mg/dL — ABNORMAL HIGH (ref 70–99)
Potassium: 4.2 mEq/L (ref 3.5–5.1)
Sodium: 142 mEq/L (ref 135–145)
Total Protein: 7.1 g/dL (ref 6.0–8.3)

## 2016-06-11 LAB — TSH: TSH: 3.04 u[IU]/mL (ref 0.35–4.50)

## 2016-06-11 LAB — VITAMIN B12: Vitamin B-12: 193 pg/mL — ABNORMAL LOW (ref 211–911)

## 2016-06-11 LAB — HEMOGLOBIN A1C: HEMOGLOBIN A1C: 9.6 % — AB (ref 4.6–6.5)

## 2016-06-14 ENCOUNTER — Ambulatory Visit (INDEPENDENT_AMBULATORY_CARE_PROVIDER_SITE_OTHER)
Admission: RE | Admit: 2016-06-14 | Discharge: 2016-06-14 | Disposition: A | Discharge status: Home / Self Care | Payer: Self-pay | Source: Ambulatory Visit | Attending: Family Medicine | Admitting: Family Medicine

## 2016-06-14 ENCOUNTER — Telehealth: Payer: Self-pay | Admitting: Family Medicine

## 2016-06-14 DIAGNOSIS — R109 Unspecified abdominal pain: Secondary | ICD-10-CM | POA: Insufficient documentation

## 2016-06-14 DIAGNOSIS — R1012 Left upper quadrant pain: Secondary | ICD-10-CM

## 2016-06-14 MED ORDER — TRAMADOL HCL 50 MG PO TABS: 50.0000 mg | ORAL_TABLET | Freq: Two times a day (BID) | ORAL | 0 refills | Status: DC

## 2016-06-14 NOTE — Addendum Note (Signed)
Addended by: Carter Kitten on: 06/14/2016 12:44 PM   Modules accepted: Orders

## 2016-06-14 NOTE — Telephone Encounter (Signed)
Pt just checked with [facility] [place] and no pain med there. I called and spoke with Herbie Baltimore at [facility]. They have not had time to ck v/m machine since lunch and he will ck machine now and if not there he will cb to [facility] and if there will get ready for pick up. Pt voiced understanding and will ck with pharmacy later today.

## 2016-06-14 NOTE — Telephone Encounter (Signed)
Left message for Micheal Clark that given pain is significant..Dr. Diona Browner would like for him to return for a X-ray of his thoracic spine,  if agreeable.  Advised we were calling him in Tramadol to his pharmacy to use for pain as well.  Tramadol called into [facility] [address] in [place].

## 2016-06-14 NOTE — Telephone Encounter (Signed)
-----   Message from Carter Kitten, [place] sent at 06/14/2016  8:18 AM EDT ----- Mr. Bartelt notified as instructed by telephone.  He states the diclofenac is not helping with the pain.  Wants to know what else can be done about that.  Please advise.

## 2016-06-14 NOTE — Telephone Encounter (Signed)
Given pain is significant.. Have him return if agreeable for X-ray of thoracic spine.  Call in tramadol 50 mg twice daily for pain #20 0RF.

## 2016-06-22 ENCOUNTER — Telehealth: Payer: Self-pay | Admitting: Family Medicine

## 2016-06-22 NOTE — Telephone Encounter (Signed)
Pt called and wanted results of x-ray.  Re-read all of the results, he did not remember speaking to Butch Penny and states he had no messages.  He will complete tramidol and keep his appointment with PCP as he cannot afford PT.

## 2016-06-28 ENCOUNTER — Telehealth: Payer: Self-pay | Admitting: Internal Medicine

## 2016-06-28 NOTE — Telephone Encounter (Signed)
6 month fu per ckout 10/15/15 End 3 attempts to schedule fu appt from recall list.   Deleting recall.

## 2016-07-01 ENCOUNTER — Telehealth: Payer: Self-pay

## 2016-07-01 ENCOUNTER — Ambulatory Visit (INDEPENDENT_AMBULATORY_CARE_PROVIDER_SITE_OTHER): Payer: Self-pay | Admitting: Family Medicine

## 2016-07-01 ENCOUNTER — Encounter: Payer: Self-pay | Admitting: Family Medicine

## 2016-07-01 VITALS — BP 130/84 | HR 92 | Temp 98.3°F | Ht 68.0 in | Wt 226.5 lb

## 2016-07-01 DIAGNOSIS — E1165 Type 2 diabetes mellitus with hyperglycemia: Secondary | ICD-10-CM

## 2016-07-01 DIAGNOSIS — IMO0001 Reserved for inherently not codable concepts without codable children: Secondary | ICD-10-CM

## 2016-07-01 MED ORDER — METFORMIN HCL ER 500 MG PO TB24: ORAL_TABLET | ORAL | 1 refills | Status: DC

## 2016-07-01 MED ORDER — GLIPIZIDE ER 10 MG PO TB24: 10.0000 mg | ORAL_TABLET | Freq: Every day | ORAL | 1 refills | Status: DC

## 2016-07-01 NOTE — Telephone Encounter (Signed)
Pt left v/m; pt was seen earlier today and pt was started on new diabetic med and pt cannot remember if he was to take the new diabetic med with metformin or was he to stop metformin. Pt request cb. 07/01/16 note not finished.Please advise.

## 2016-07-01 NOTE — Progress Notes (Signed)
Dr. Frederico Hamman T. Malin Cervini, MD, [facility] Primary [facility] and Sports Medicine [place], [address] Phone: [phone] Fax: [phone]  07/01/2016  Patient: Micheal Clark, MRN: [id number], DOB: September 18, 1955, 61 y.o.  Primary Physician:  Owens Loffler, MD   Chief Complaint  Patient presents with  . Diabetes   Subjective:   Micheal Clark is a 61 y.o. very pleasant male patient who presents with the following:  DM  GEN: No acute illnesses, no fevers, chills. GI: No n/v/d, eating normally Pulm: No SOB Interactive and getting along well at home.  Otherwise, ROS is as per the HPI.  Objective:   BP 130/84   Pulse 92   Temp 98.3 F (36.8 C) (Oral)   Ht 5\' 8"  (1.727 m)   Wt 226 lb 8 oz (102.7 kg)   BMI 34.44 kg/m   GEN: WDWN, NAD, Non-toxic, A & O x 3 HEENT: Atraumatic, Normocephalic. Neck supple. No masses, No LAD. Ears and Nose: No external deformity. CV: RRR, No M/G/R. No JVD. No thrill. No extra heart sounds. PULM: CTA B, no wheezes, crackles, rhonchi. No retractions. No resp. distress. No accessory muscle use. EXTR: No c/c/e NEURO Normal gait.  PSYCH: Normally interactive. Conversant. Not depressed or anxious appearing.  Calm demeanor.   Laboratory and Imaging Data: Results for orders placed or performed in visit on 06/10/16  TSH  Result Value Ref Range   TSH 3.04 0.35 - 4.50 uIU/mL  CBC with Differential/Platelet  Result Value Ref Range   WBC 9.4 4.0 - 10.5 K/uL   RBC 4.84 4.22 - 5.81 Mil/uL   Hemoglobin 15.1 13.0 - 17.0 g/dL   HCT 43.6 39.0 - 52.0 %   MCV 90.0 78.0 - 100.0 fl   MCHC 34.6 30.0 - 36.0 g/dL   RDW 12.6 11.5 - 15.5 %   Platelets 241.0 150.0 - 400.0 K/uL   Neutrophils Relative % 56.4 43.0 - 77.0 %   Lymphocytes Relative 35.5 12.0 - 46.0 %   Monocytes Relative 6.0 3.0 - 12.0 %   Eosinophils Relative 1.2 0.0 - 5.0 %   Basophils Relative 0.9 0.0 - 3.0 %   Neutro Abs 5.3 1.4 - 7.7 K/uL   Lymphs Abs 3.3 0.7 - 4.0  K/uL   Monocytes Absolute 0.6 0.1 - 1.0 K/uL   Eosinophils Absolute 0.1 0.0 - 0.7 K/uL   Basophils Absolute 0.1 0.0 - 0.1 K/uL  Vitamin B12  Result Value Ref Range   Vitamin B-12 193 (L) 211 - 911 pg/mL  Comprehensive metabolic panel  Result Value Ref Range   Sodium 142 135 - 145 mEq/L   Potassium 4.2 3.5 - 5.1 mEq/L   Chloride 105 96 - 112 mEq/L   CO2 28 19 - 32 mEq/L   Glucose, Bld 178 (H) 70 - 99 mg/dL   BUN 11 6 - 23 mg/dL   Creatinine, Ser 0.83 0.40 - 1.50 mg/dL   Total Bilirubin 0.6 0.2 - 1.2 mg/dL   Alkaline Phosphatase 61 39 - 117 U/L   AST 18 0 - 37 U/L   ALT 22 0 - 53 U/L   Total Protein 7.1 6.0 - 8.3 g/dL   Albumin 4.4 3.5 - 5.2 g/dL   Calcium 9.7 8.4 - 10.5 mg/dL   GFR 100.64 >60.00 mL/min  Hemoglobin A1c  Result Value Ref Range   Hgb A1c MFr Bld 9.6 (H) 4.6 - 6.5 %     Assessment and Plan:   Uncontrolled  type 2 diabetes mellitus without complication, without long-term current use of insulin (HCC)  >10 minutes spent in face to face time with patient, >50% spent in counselling or coordination of care   Diabetes is not well controlled.  He does have some financial limitation.  He is currently maxed out on metformin.  We will add glipizide with close follow-up.  He also does have a B12 deficiency, which I'm suspicious is causing the tingling 6 it sensation around his ribs on the left.  Follow-up: Return in about 3 months (around 10/01/2016).  Future Appointments Date Time Provider [place]  10/04/2016 8:00 AM Nyazia Canevari, Frederico Hamman, MD [facility]    Meds ordered this encounter  Medications  . glipiZIDE (GLUCOTROL XL) 10 MG 24 hr tablet    Sig: Take 1 tablet (10 mg total) by mouth daily with breakfast.    Dispense:  90 tablet    Refill:  1  . metFORMIN (GLUCOPHAGE-XR) 500 MG 24 hr tablet    Sig: TAKE FOUR TABLETS (2,000 MG TOTAL) BY MOUTH EVERY MORNING WITH BREAKFAST    Dispense:  360 tablet    Refill:  1   Medications Discontinued During  This Encounter  Medication Reason  . metFORMIN (GLUCOPHAGE-XR) 500 MG 24 hr tablet Reorder   No orders of the defined types were placed in this encounter.   Signed,  Maud Deed. Rheannon Cerney, MD   Allergies as of 07/01/2016   No Known Allergies     Medication List       Accurate as of 07/01/16 11:59 PM. Always use your most recent med list.          aspirin 81 MG tablet Take 81 mg by mouth daily.   diclofenac 75 MG EC tablet Commonly known as:  VOLTAREN Take 1 tablet (75 mg total) by mouth 2 (two) times daily.   glipiZIDE 10 MG 24 hr tablet Commonly known as:  GLUCOTROL XL Take 1 tablet (10 mg total) by mouth daily with breakfast.   metFORMIN 500 MG 24 hr tablet Commonly known as:  GLUCOPHAGE-XR TAKE FOUR TABLETS (2,000 MG TOTAL) BY MOUTH EVERY MORNING WITH BREAKFAST   simvastatin 40 MG tablet Commonly known as:  ZOCOR TAKE ONE TABLET BY MOUTH EVERY NIGHT AT BEDTIME   traMADol 50 MG tablet Commonly known as:  ULTRAM Take 1 tablet (50 mg total) by mouth 2 (two) times daily.

## 2016-07-01 NOTE — Telephone Encounter (Signed)
Avrian notified that the glipizide is in addition to the Metformin, so he will take both medications.  Patient states understanding.

## 2016-07-06 ENCOUNTER — Other Ambulatory Visit: Payer: Self-pay | Admitting: Family Medicine

## 2016-10-04 ENCOUNTER — Ambulatory Visit: Payer: Self-pay | Admitting: Family Medicine

## 2016-11-01 ENCOUNTER — Ambulatory Visit (INDEPENDENT_AMBULATORY_CARE_PROVIDER_SITE_OTHER): Payer: Self-pay | Admitting: Family Medicine

## 2016-11-01 ENCOUNTER — Encounter: Payer: Self-pay | Admitting: Family Medicine

## 2016-11-01 VITALS — BP 112/72 | HR 84 | Temp 97.9°F | Ht 68.0 in | Wt 235.0 lb

## 2016-11-01 DIAGNOSIS — IMO0001 Reserved for inherently not codable concepts without codable children: Secondary | ICD-10-CM

## 2016-11-01 DIAGNOSIS — E1165 Type 2 diabetes mellitus with hyperglycemia: Secondary | ICD-10-CM

## 2016-11-01 DIAGNOSIS — M1652 Unilateral post-traumatic osteoarthritis, left hip: Secondary | ICD-10-CM

## 2016-11-01 LAB — HEMOGLOBIN A1C: HEMOGLOBIN A1C: 8.8 % — AB (ref 4.6–6.5)

## 2016-11-01 NOTE — Progress Notes (Signed)
Dr. Frederico Hamman T. Margaux Engen, MD, [facility] Primary [facility] and Sports Medicine [place], [address] Phone: [phone] Fax: [phone]  11/01/2016  Patient: Micheal Clark, MRN: [id number], DOB: 1955/12/09, 61 y.o.  Primary Physician:  Owens Loffler, MD   Chief Complaint  Patient presents with  . Diabetes   Subjective:   Micheal Clark is a 61 y.o. very pleasant male patient who presents with the following:  140-220 8 pound weight.  He thinks his diabetes is doing better, but he doesn't always check his sugars, and he generally does not follow the best diet.  He has lost some weight and is down about 8 pounds.  Hip fracture at [facility].  [facility].  He had an acetabular fracture 20 years ago at [facility], and they've managed this acutely.  I discussed his case previously with some of our local orthopedic surgeons, and they felt as if this would best be managed by a total joint fellowship trained orthopedic surgeon at a tertiary care center.  Objective:   BP 112/72   Pulse 84   Temp 97.9 F (36.6 C) (Oral)   Ht 5\' 8"  (1.727 m)   Wt 235 lb (106.6 kg)   BMI 35.73 kg/m   GEN: WDWN, NAD, Non-toxic, A & O x 3 HEENT: Atraumatic, Normocephalic. Neck supple. No masses, No LAD. Ears and Nose: No external deformity. CV: RRR, No M/G/R. No JVD. No thrill. No extra heart sounds. PULM: CTA B, no wheezes, crackles, rhonchi. No retractions. No resp. distress. No accessory muscle use. EXTR: No c/c/e NEURO Normal gait.  PSYCH: Normally interactive. Conversant. Not depressed or anxious appearing.  Calm demeanor.   Laboratory and Imaging Data: CLINICAL DATA:  Left hip pain.  Remote fracture.  EXAM: DG HIP (WITH OR WITHOUT PELVIS) 2-3V LEFT  COMPARISON:  None.  FINDINGS: Severe left hip osteoarthritis with circumferential bone-on-bone contact, subchondral cysts, and spurring. This is presumably accelerated by prior left acetabular  fracture status post ORIF. The left sacroiliac joint is either degenerated or fused. No fracture or focal bone lesion.  IMPRESSION: Severe left hip osteoarthritis, presumably posttraumatic given left acetabular fracture ORIF.   Electronically Signed   By: Monte Fantasia M.D.   On: 04/25/2015 08:27  Results for orders placed or performed in visit on 11/01/16  Hemoglobin A1c  Result Value Ref Range   Hgb A1c MFr Bld 8.8 (H) 4.6 - 6.5 %     Assessment and Plan:   Diabetes mellitus type 2, uncontrolled, without complications ([place]) - Plan: Hemoglobin A1c  Post-traumatic osteoarthritis of left hip  >10 minutes spent in face to face time with patient, >50% spent in counselling or coordination of care   We will have to increase his diabetes medication.  I'm that a try to see if we can get him in to see one of the joint replacement surgeons at [facility] for their opinion.  Follow-up: No Follow-up on file.   Medications Discontinued During This Encounter  Medication Reason  . traMADol (ULTRAM) 50 MG tablet Completed Course  . diclofenac (VOLTAREN) 75 MG EC tablet Completed Course   Orders Placed This Encounter  Procedures  . Hemoglobin A1c    Signed,  Kawika Bischoff T. Kiyo Heal, MD   Patient's Medications  New Prescriptions   No medications on file  Previous Medications   ASPIRIN 81 MG TABLET    Take 81 mg by mouth daily.   GLIPIZIDE (GLUCOTROL XL) 10 MG 24 HR  TABLET    Take 1 tablet (10 mg total) by mouth daily with breakfast.   METFORMIN (GLUCOPHAGE-XR) 500 MG 24 HR TABLET    TAKE FOUR TABLETS (2,000 MG TOTAL) BY MOUTH EVERY MORNING WITH BREAKFAST   SIMVASTATIN (ZOCOR) 40 MG TABLET    Take 1 tablet (40 mg total) by mouth at bedtime.  Modified Medications   No medications on file  Discontinued Medications   DICLOFENAC (VOLTAREN) 75 MG EC TABLET    Take 1 tablet (75 mg total) by mouth 2 (two) times daily.   TRAMADOL (ULTRAM) 50 MG TABLET    Take 1 tablet (50 mg total) by  mouth 2 (two) times daily.

## 2016-11-02 ENCOUNTER — Telehealth: Payer: Self-pay | Admitting: *Deleted

## 2016-11-02 MED ORDER — GLIPIZIDE ER 10 MG PO TB24: 20.0000 mg | ORAL_TABLET | Freq: Every day | ORAL | 1 refills | Status: DC

## 2016-11-02 NOTE — Telephone Encounter (Signed)
-----   Message from Owens Loffler, MD sent at 11/02/2016 10:20 AM EDT ----- a1c came back at 8.8  Lower than 7 is the goal.  We need his Glipizide XL 10 mg tabs to 2 tablets once a day, #180, 1 ref  Keep taking the metformin, too.   F/u with me in 6 months - keep working on diet and weight They are checking about [facility] appointment.

## 2016-11-02 NOTE — Telephone Encounter (Signed)
Mr. Boateng notified as instructed by telephone.  Glipizide XL 10 mg prescription sent into [facility] as instructed by Dr. Lorelei Pont.  Appointment scheduled for 03/30/17 at 8:00 with Dr. Lorelei Pont to follow up on his diabetes.

## 2016-11-16 ENCOUNTER — Other Ambulatory Visit: Payer: Self-pay | Admitting: Family Medicine

## 2016-11-16 NOTE — Telephone Encounter (Signed)
Last office visit 11/01/2016.  Last Lipid 10/07/2015.  Refill?

## 2016-11-17 NOTE — Telephone Encounter (Signed)
Call center is putting phone calls into opened refill encounters.  I did call and speak with Mr. Fluegge who just needed the phone number to [facility].  Phone number  [phone], which was documented on his referral,  given to patient.

## 2016-11-17 NOTE — Telephone Encounter (Signed)
Copied from [place] [id number]. Topic: Appointment Scheduling - Scheduling Inquiry for Clinic >> Nov 17, 2016 11:32 AM Malena Catholic I, NT wrote: Reason for CRM: need [facility] at [facility] pt would like speck with Dr Lorelei Pont nurse

## 2016-12-20 ENCOUNTER — Telehealth: Payer: Self-pay | Admitting: Family Medicine

## 2016-12-20 NOTE — Telephone Encounter (Signed)
There is really no further advice. In the [place] medical system there is no safeguard for people without medical insurance. I don't know where else he could possibly go for care of complex hip OA case.

## 2016-12-20 NOTE — Telephone Encounter (Signed)
Micheal Clark called patient and he had a Careers adviser at [facility]. After consult patient was told that they would not pay for a Hip Replacement. Please advise.

## 2016-12-27 ENCOUNTER — Other Ambulatory Visit: Payer: Self-pay | Admitting: Family Medicine

## 2017-03-03 ENCOUNTER — Other Ambulatory Visit: Payer: Self-pay | Admitting: Family Medicine

## 2017-03-30 ENCOUNTER — Encounter: Payer: Self-pay | Admitting: Family Medicine

## 2017-03-30 ENCOUNTER — Other Ambulatory Visit: Payer: Self-pay

## 2017-03-30 ENCOUNTER — Ambulatory Visit: Payer: Self-pay | Admitting: Family Medicine

## 2017-03-30 VITALS — BP 118/76 | HR 84 | Temp 98.3°F | Ht 68.0 in | Wt 225.0 lb

## 2017-03-30 DIAGNOSIS — E1165 Type 2 diabetes mellitus with hyperglycemia: Secondary | ICD-10-CM

## 2017-03-30 DIAGNOSIS — IMO0001 Reserved for inherently not codable concepts without codable children: Secondary | ICD-10-CM

## 2017-03-30 LAB — HEMOGLOBIN A1C: Hgb A1c MFr Bld: 9.6 % — ABNORMAL HIGH (ref 4.6–6.5)

## 2017-03-30 NOTE — Progress Notes (Signed)
   Dr. Frederico Hamman T. Kavon Valenza, MD, [facility] Primary Care and Sports Medicine [place], [address] Phone: [phone] Fax: [phone]  03/30/2017  Patient: Micheal Clark, MRN: [id number], DOB: 03-Oct-1955, 62 y.o.  Primary Physician:  Owens Loffler, MD   Chief Complaint  Patient presents with  . Diabetes   Subjective:   Micheal Clark is a 62 y.o. very pleasant male patient who presents with the following:  Lost 10 pounds  BP good  Diabetes Mellitus: Tolerating Medications: yes Compliance with diet: fair Exercise: minimal / intermittent Avg blood sugars at home: not checking Foot problems: none Hypoglycemia: none No nausea, vomitting, blurred vision, polyuria.  Lab Results  Component Value Date   HGBA1C 8.8 (H) 11/01/2016   HGBA1C 9.6 (H) 06/10/2016   HGBA1C 9.5 (H) 06/25/2015   Lab Results  Component Value Date   LDLCALC 111 (H) 08/26/2014   CREATININE 0.83 06/10/2016    Wt Readings from Last 3 Encounters:  03/30/17 225 lb (102.1 kg)  11/01/16 235 lb (106.6 kg)  07/01/16 226 lb 8 oz (102.7 kg)    Body mass index is 34.21 kg/m.   GEN: No acute illnesses, no fevers, chills. GI: No n/v/d, eating normally Pulm: No SOB Interactive and getting along well at home.  Otherwise, ROS is as per the HPI.  Objective:   BP 118/76   Pulse 84   Temp 98.3 F (36.8 C) (Oral)   Ht 5\' 8"  (1.727 m)   Wt 225 lb (102.1 kg)   BMI 34.21 kg/m   GEN: WDWN, NAD, Non-toxic, A & O x 3 HEENT: Atraumatic, Normocephalic. Neck supple. No masses, No LAD. Ears and Nose: No external deformity. CV: RRR, No M/G/R. No JVD. No thrill. No extra heart sounds. PULM: CTA B, no wheezes, crackles, rhonchi. No retractions. No resp. distress. No accessory muscle use. EXTR: No c/c/e NEURO Normal gait.  PSYCH: Normally interactive. Conversant. Not depressed or anxious appearing.  Calm demeanor.   Laboratory and Imaging Data: Results for orders placed or  performed in visit on 11/01/16  Hemoglobin A1c  Result Value Ref Range   Hgb A1c MFr Bld 8.8 (H) 4.6 - 6.5 %     Assessment and Plan:   Diabetes mellitus type 2, uncontrolled, without complications ([place]) - Plan: Hemoglobin A1c  Check a1c  Cont L hip pain - checking with [facility] to see if they can see him  Follow-up: Return in about 6 months (around 09/30/2017) for diabetes follow-up.  Orders Placed This Encounter  Procedures  . Hemoglobin A1c    Signed,  Jeanene Mena T. Yasuo Phimmasone, MD   Allergies as of 03/30/2017   No Known Allergies     Medication List        Accurate as of 03/30/17  8:35 AM. Always use your most recent med list.          aspirin 81 MG tablet Take 81 mg by mouth daily.   glipiZIDE 10 MG 24 hr tablet Commonly known as:  GLUCOTROL XL Take 2 tablets (20 mg total) by mouth daily with breakfast.   metFORMIN 500 MG 24 hr tablet Commonly known as:  GLUCOPHAGE-XR TAKE FOUR TABLETS BY MOUTH EVERY MORNING WITH BREAKFAST   simvastatin 40 MG tablet Commonly known as:  ZOCOR TAKE ONE TABLET BY MOUTH EVERY NIGHT AT BEDTIME

## 2017-04-06 ENCOUNTER — Telehealth: Payer: Self-pay

## 2017-04-06 ENCOUNTER — Telehealth: Payer: Self-pay | Admitting: *Deleted

## 2017-04-06 MED ORDER — PIOGLITAZONE HCL 30 MG PO TABS: 30.0000 mg | ORAL_TABLET | Freq: Every day | ORAL | 3 refills | Status: DC

## 2017-04-06 NOTE — Telephone Encounter (Signed)
Copied from [name]. Topic: Quick Communication - Lab Results >> Apr 04, 2017 11:13 AM Micheal Clark, CMA wrote: Called patient to inform them of A1C lab results. When patient returns call, triage nurse may disclose results and find out if he is willing to add Actos to his diabetic medications. >> Apr 06, 2017  3:14 PM Micheal Clark wrote: Pt called in and said that the Actos is $48.00 and he can not afford this med.  Is there something else that could be cheaper.  He doesn't have ins

## 2017-04-06 NOTE — Telephone Encounter (Signed)
-----   Message from Owens Loffler, MD sent at 04/06/2017  1:45 PM EDT ----- actos 30 mg, 1 po daily, #90, 3 ref

## 2017-04-06 NOTE — Telephone Encounter (Signed)
No, not to my knowledge. That is for a 3 month supply.

## 2017-04-06 NOTE — Telephone Encounter (Signed)
Left message for Micheal Clark that the Actos prescription was sent in for a 90 day supply but he can ask the pharmacist at [facility] to just fill the medication for a 30 day supply and it will cost him $14.75 a month.  If he is unable to pay that amount, I ask that he call us back and we can send a prescription to [facility] and it will run him $9.00 for a 30 day supply.

## 2017-04-06 NOTE — Telephone Encounter (Signed)
Actos sent into [facility] as instructed by Dr. Lorelei Pont.

## 2017-04-07 NOTE — Telephone Encounter (Signed)
Left message for Micheal Clark to call me back and let me know that he got my message yesterday and if he is going to be able to pay $14.75 at Micheal Clark for his Actos or do I need to send a Rx to [facility] for $9:00.  I just want to make sure he is able to get his new diabetic medication.  Ok to [facility] to get information from patient when he calls back.

## 2017-10-03 ENCOUNTER — Encounter: Payer: Self-pay | Admitting: Family Medicine

## 2017-10-03 ENCOUNTER — Telehealth: Payer: Self-pay

## 2017-10-03 ENCOUNTER — Ambulatory Visit: Payer: Self-pay | Admitting: Family Medicine

## 2017-10-03 DIAGNOSIS — E1165 Type 2 diabetes mellitus with hyperglycemia: Secondary | ICD-10-CM

## 2017-10-03 DIAGNOSIS — IMO0001 Reserved for inherently not codable concepts without codable children: Secondary | ICD-10-CM

## 2017-10-03 LAB — POCT GLYCOSYLATED HEMOGLOBIN (HGB A1C): Hemoglobin A1C: 8.4 % — AB (ref 4.0–5.6)

## 2017-10-03 MED ORDER — PIOGLITAZONE HCL 45 MG PO TABS: 45.0000 mg | ORAL_TABLET | Freq: Every day | ORAL | 3 refills | Status: DC

## 2017-10-03 NOTE — Telephone Encounter (Signed)
Copied from [place] [id number]. Topic: Inquiry >> Oct 03, 2017 10:18 AM Oliver Pila B wrote: Reason for CRM: pt called to speak w/ nurse about a handicap placard; contact to advise

## 2017-10-03 NOTE — Telephone Encounter (Signed)
I spoke with pt; pt was seen earlier today and forgot to ask for handicap placard form. Pt has problems with his hip; pt had lt hip replaced 20 years ago and does not have insurance and cannot get replaced again; pt has pain in lt hip when walks. Handicap parking placard application in Dr Copland's in box.Please advise.

## 2017-10-03 NOTE — Telephone Encounter (Signed)
noted 

## 2017-10-03 NOTE — Progress Notes (Signed)
   Dr. Frederico Hamman T. Kamora Vossler, MD, [facility] Primary Care and Sports Medicine [place], [address] Phone: [phone] Fax: [phone]  10/03/2017  Patient: Micheal Clark, MRN: [id number], DOB: October 11, 1955, 62 y.o.  Primary Physician:  Owens Loffler, MD   Chief Complaint  Patient presents with  . Diabetes   Subjective:   Micheal Clark is a 62 y.o. very pleasant male patient who presents with the following:  Diabetes Mellitus: Tolerating Medications: yes Compliance with diet: fair Exercise: minimal / intermittent Avg blood sugars at home: not checking Foot problems: none Hypoglycemia: none No nausea, vomitting, blurred vision, polyuria.  Lab Results  Component Value Date   HGBA1C 8.4 (A) 10/03/2017   HGBA1C 9.6 (H) 03/30/2017   HGBA1C 8.8 (H) 11/01/2016   Lab Results  Component Value Date   LDLCALC 111 (H) 08/26/2014   CREATININE 0.83 06/10/2016    Wt Readings from Last 3 Encounters:  10/03/17 231 lb 12 oz (105.1 kg)  03/30/17 225 lb (102.1 kg)  11/01/16 235 lb (106.6 kg)    Body mass index is 35.24 kg/m.   GEN: No acute illnesses, no fevers, chills. GI: No n/v/d, eating normally Pulm: No SOB Interactive and getting along well at home.  Otherwise, ROS is as per the HPI.  Objective:   BP 112/70   Pulse 74   Temp 98.2 F (36.8 C) (Oral)   Ht 5\' 8"  (1.727 m)   Wt 231 lb 12 oz (105.1 kg)   BMI 35.24 kg/m   GEN: WDWN, NAD, Non-toxic, A & O x 3 HEENT: Atraumatic, Normocephalic. Neck supple. No masses, No LAD. Ears and Nose: No external deformity. CV: RRR, No M/G/R. No JVD. No thrill. No extra heart sounds. PULM: CTA B, no wheezes, crackles, rhonchi. No retractions. No resp. distress. No accessory muscle use. EXTR: No c/c/e NEURO Normal gait.  PSYCH: Normally interactive. Conversant. Not depressed or anxious appearing.  Calm demeanor.   Laboratory and Imaging Data:  Assessment and Plan:   Diabetes mellitus type 2,  uncontrolled, without complications ([place]) - Plan: POCT glycosylated hemoglobin (Hb A1C)  Improved dm, change actos to 45  Follow-up: Return in about 6 months (around 04/03/2018).  Meds ordered this encounter  Medications  . pioglitazone (ACTOS) 45 MG tablet    Sig: Take 1 tablet (45 mg total) by mouth daily.    Dispense:  90 tablet    Refill:  3   Orders Placed This Encounter  Procedures  . POCT glycosylated hemoglobin (Hb A1C)    Signed,  Foster Frericks T. Jabarie Pop, MD   Allergies as of 10/03/2017   No Known Allergies     Medication List        Accurate as of 10/03/17  8:26 AM. Always use your most recent med list.          aspirin 81 MG tablet Take 81 mg by mouth daily.   glipiZIDE 10 MG 24 hr tablet Commonly known as:  GLUCOTROL XL Take 2 tablets (20 mg total) by mouth daily with breakfast.   metFORMIN 500 MG 24 hr tablet Commonly known as:  GLUCOPHAGE-XR TAKE FOUR TABLETS BY MOUTH EVERY MORNING WITH BREAKFAST   pioglitazone 45 MG tablet Commonly known as:  ACTOS Take 1 tablet (45 mg total) by mouth daily.   simvastatin 40 MG tablet Commonly known as:  ZOCOR TAKE ONE TABLET BY MOUTH EVERY NIGHT AT BEDTIME

## 2017-10-26 ENCOUNTER — Ambulatory Visit: Payer: Self-pay | Admitting: Family Medicine

## 2018-03-26 ENCOUNTER — Other Ambulatory Visit: Payer: Self-pay | Admitting: Family Medicine

## 2018-03-27 ENCOUNTER — Other Ambulatory Visit: Payer: Self-pay | Admitting: Family Medicine

## 2018-03-27 NOTE — Telephone Encounter (Signed)
Last office visit 10/03/2017 for DM.  Last Lipid Panel 08/26/2014.  No future appointments.  Refill?

## 2018-03-28 ENCOUNTER — Telehealth: Payer: Self-pay | Admitting: Family Medicine

## 2018-03-28 NOTE — Telephone Encounter (Signed)
cpx labs 3/26 Cpx 4/1 Pt aware

## 2018-03-28 NOTE — Telephone Encounter (Signed)
Left message asking pt to call office   Please schedule Micheal Clark for CPE with fasting labs prior. He is due for appointment to follow up on his diabetes this month but he really needs a CPE. He he won't schedule that, please at least schedule follow up on diabetes.   Thanks,  Butch Penny

## 2018-04-13 ENCOUNTER — Other Ambulatory Visit: Payer: Self-pay | Admitting: Family Medicine

## 2018-04-13 DIAGNOSIS — Z79899 Other long term (current) drug therapy: Secondary | ICD-10-CM

## 2018-04-13 DIAGNOSIS — E1165 Type 2 diabetes mellitus with hyperglycemia: Principal | ICD-10-CM

## 2018-04-13 DIAGNOSIS — E785 Hyperlipidemia, unspecified: Secondary | ICD-10-CM

## 2018-04-13 DIAGNOSIS — Z1159 Encounter for screening for other viral diseases: Secondary | ICD-10-CM

## 2018-04-13 DIAGNOSIS — Z125 Encounter for screening for malignant neoplasm of prostate: Secondary | ICD-10-CM

## 2018-04-13 DIAGNOSIS — IMO0001 Reserved for inherently not codable concepts without codable children: Secondary | ICD-10-CM

## 2018-04-19 ENCOUNTER — Telehealth: Payer: Self-pay | Admitting: Family Medicine

## 2018-04-19 NOTE — Telephone Encounter (Signed)
Left message asking pt to call office please r/s labs and cpx

## 2018-04-20 ENCOUNTER — Other Ambulatory Visit: Payer: Self-pay

## 2018-04-20 ENCOUNTER — Encounter: Payer: Self-pay | Admitting: Family Medicine

## 2018-04-25 ENCOUNTER — Other Ambulatory Visit: Payer: Self-pay

## 2018-04-25 ENCOUNTER — Encounter: Payer: Self-pay | Admitting: Family Medicine

## 2018-04-25 ENCOUNTER — Ambulatory Visit: Payer: Self-pay | Admitting: Family Medicine

## 2018-04-25 VITALS — BP 130/80 | HR 106 | Temp 98.3°F | Ht 68.0 in | Wt 226.8 lb

## 2018-04-25 DIAGNOSIS — R1032 Left lower quadrant pain: Secondary | ICD-10-CM | POA: Diagnosis not present

## 2018-04-25 DIAGNOSIS — M549 Dorsalgia, unspecified: Secondary | ICD-10-CM | POA: Diagnosis not present

## 2018-04-25 MED ORDER — DICLOFENAC SODIUM 75 MG PO TBEC: 75.0000 mg | DELAYED_RELEASE_TABLET | Freq: Two times a day (BID) | ORAL | 0 refills | Status: DC

## 2018-04-25 NOTE — Patient Instructions (Signed)
Start diclofenac twice daily for pain and inflammation.  Start home back exercises.  Heat and massage on mid back.  Call if pain not improving in 48 hours for consideration of abd CT to eval further.
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
## 2018-04-26 ENCOUNTER — Encounter: Payer: Self-pay | Admitting: Family Medicine

## 2018-05-04 DIAGNOSIS — M549 Dorsalgia, unspecified: Secondary | ICD-10-CM | POA: Insufficient documentation

## 2018-05-04 DIAGNOSIS — R1032 Left lower quadrant pain: Secondary | ICD-10-CM | POA: Insufficient documentation

## 2018-05-04 NOTE — Assessment & Plan Note (Signed)
Start diclofenac twice daily for pain and inflammation.  Start home back exercises.  Heat and massage on mid back.

## 2018-05-04 NOTE — Assessment & Plan Note (Addendum)
No sign of shingles rash.  Most consistent with radiation of back pain from thoracic spine  if not improving will need further eval with abd CT.

## 2018-05-10 ENCOUNTER — Telehealth: Payer: Self-pay

## 2018-05-10 NOTE — Telephone Encounter (Signed)
Doxy.Me appointment scheduled for 05/11/2018 at 2:15 pm.

## 2018-05-10 NOTE — Telephone Encounter (Signed)
Pt last seen 04/25/18.pt still has consistent pain in lt side that radiates to abd. At times it is a stabbing pain that comes and goes but there is pain (3-4 on pain scale)all the time. No rash or blister like areas.no fever, no N&V& diarrhea.pt noted it hurts worse when riding or moving around. If pain continued pt was to cb to possibly get CT. Pt request cb.

## 2018-05-10 NOTE — Telephone Encounter (Signed)
Given it has been 2 weeks instead of the 48 hour call back recommended, this is more consistent with back pain... I need to reassess this patient to determine if CT still appropriate. Please call pt to set up virtual OV tommorow.

## 2018-05-11 ENCOUNTER — Encounter: Payer: Self-pay | Admitting: Family Medicine

## 2018-05-11 ENCOUNTER — Ambulatory Visit (INDEPENDENT_AMBULATORY_CARE_PROVIDER_SITE_OTHER): Payer: PRIVATE HEALTH INSURANCE | Admitting: Family Medicine

## 2018-05-11 VITALS — Ht 68.0 in

## 2018-05-11 DIAGNOSIS — IMO0001 Reserved for inherently not codable concepts without codable children: Secondary | ICD-10-CM

## 2018-05-11 DIAGNOSIS — R1032 Left lower quadrant pain: Secondary | ICD-10-CM | POA: Diagnosis not present

## 2018-05-11 DIAGNOSIS — E1165 Type 2 diabetes mellitus with hyperglycemia: Secondary | ICD-10-CM

## 2018-05-11 DIAGNOSIS — M549 Dorsalgia, unspecified: Secondary | ICD-10-CM

## 2018-05-11 NOTE — Progress Notes (Signed)
VIRTUAL VISIT Due to national recommendations of social distancing due to [place] 19, a virtual visit is felt to be most appropriate for this patient at this time.   I connected with the patient on 05/11/18 at  2:15 PM EDT by virtual telehealth platform and verified that I am speaking with the correct person using two identifiers.   I discussed the limitations, risks, security and privacy concerns of performing an evaluation and management service by  virtual telehealth platform and the availability of in person appointments. I also discussed with the patient that there may be a patient responsible charge related to this service. The patient expressed understanding and agreed to proceed.  Patient location: Home Provider Location: [facility] Participants: Micheal Clark and Micheal Clark   Chief Complaint  Patient presents with  . Follow-up    Left Side Pain    History of Present Illness: 63 year old obese diabetic male male presents with continued abdominal pain, left lower quadrant as well as mid left back pain.   Seen last on 3/31: At that time: Most consistent with radiation of back pain from thoracic spine    Has now been going on 1.5 months.Marland Kitchen described as achy , occ sharp, off and on diarrhea,  4/10 on pain scale,, sometime stabbing 10/10 pain, lasts a few seconds. Sharp pain happens several times a day.  Still with sensitivity to touch in left flank.  No vertebrae ttp.   no blood in stool.  No fever.  No known fall or injury. No noted hernia.  pushing po intake   FBS 225-275 On max metformin, glipizide and actos.  COVID 19 screen No recent travel or known exposure to COVID19 The patient denies respiratory symptoms of COVID 19 at this time.  The importance of social distancing was discussed today.   Review of Systems  Constitutional: Negative for chills and fever.  HENT: Negative for congestion and ear pain.   Eyes: Negative for pain and redness.   Respiratory: Negative for cough and shortness of breath.   Cardiovascular: Negative for chest pain, palpitations and leg swelling.  Gastrointestinal: Positive for abdominal pain and diarrhea. Negative for blood in stool, constipation, nausea and vomiting.  Genitourinary: Negative for dysuria.  Musculoskeletal: Negative for falls and myalgias.  Skin: Negative for rash.  Neurological: Negative for dizziness.  Psychiatric/Behavioral: Negative for depression. The patient is not nervous/anxious.       Past Medical History:  Diagnosis Date  . Diabetes mellitus without complication ([place])   . Diabetes type 2, uncontrolled ([place]) 11/15/2011  . Hyperlipidemia 11/15/2011  . Personal history of adenomatous colonic polyps 11/24/2011  . Smokeless tobacco use 10/11/2011    reports that he quit smoking about 38 years ago. His smoking use included cigarettes. He has a 8.00 pack-year smoking history. His smokeless tobacco use includes chew. He reports current alcohol use. He reports that he does not use drugs.   Current Outpatient Medications:  .  aspirin 81 MG tablet, Take 81 mg by mouth daily., Disp: , Rfl:  .  diclofenac (VOLTAREN) 75 MG EC tablet, Take 1 tablet (75 mg total) by mouth 2 (two) times daily., Disp: 30 tablet, Rfl: 0 .  glipiZIDE (GLUCOTROL XL) 10 MG 24 hr tablet, TAKE TWO TABLETS BY MOUTH EVERY MORNING WITH BREAKFAST, Disp: 180 tablet, Rfl: 0 .  metFORMIN (GLUCOPHAGE-XR) 500 MG 24 hr tablet, TAKE FOUR TABLETS BY MOUTH EVERY MORNING WITH BREAKFAST, Disp: 360 tablet, Rfl: 1 .  pioglitazone (ACTOS) 45  MG tablet, Take 1 tablet (45 mg total) by mouth daily., Disp: 90 tablet, Rfl: 3 .  simvastatin (ZOCOR) 40 MG tablet, TAKE ONE TABLET BY MOUTH EVERY NIGHT AT BEDTIME, Disp: 90 tablet, Rfl: 2   Observations/Objective: Height 5\' 8"  (1.727 m).  Physical Exam  Physical Exam Constitutional:      General: He is not in acute distress. Pulmonary:     Effort: Pulmonary effort is normal. No  respiratory distress.  Neurological:     Mental Status: He is alert and oriented to person, place, and time.  Psychiatric:        Mood and Affect: Mood normal.        Behavior: Behavior normal.   Assessment and Plan LLQ abdominal pain Pt with persistent left lower abd pain.  No urinary symptoms. UA clear.  Not improviong with treatment for possible mid back pain referred.  reviewed ER precautions with pt.   Will obtain curbside labs and send for abd pelvis CT to assess further.  Diabetes mellitus type 2, uncontrolled, without complications (HCC) Poor control.. pt to come in for early CPX labs.. will forward A1C to PCP for recommendations as he is on max of 3 meds with liekly inadequate control.  ? If poor DM control related to  abd pain... will eval CMET , CBC etc.   I discussed the assessment and treatment plan with the patient. The patient was provided an opportunity to ask questions and all were answered. The patient agreed with the plan and demonstrated an understanding of the instructions.   The patient was advised to call back or seek an in-person evaluation if the symptoms worsen or if the condition fails to improve as anticipated.     Micheal Lofts, MD

## 2018-05-11 NOTE — Assessment & Plan Note (Signed)
Poor control.. pt to come in for early CPX labs.. will forward A1C to PCP for recommendations as he is on max of 3 meds with liekly inadequate control.  ? If poor DM control related to  abd pain... will eval CMET , CBC etc.

## 2018-05-11 NOTE — Patient Instructions (Signed)
Come in tommorow AM for fasting labs. CT will be scheduled following.

## 2018-05-11 NOTE — Assessment & Plan Note (Addendum)
Pt with persistent left lower abd pain.  No urinary symptoms. UA clear.  Not improviong with treatment for possible mid back pain referred.  reviewed ER precautions with pt.   Will obtain curbside labs and send for abd pelvis CT to assess further.

## 2018-05-12 ENCOUNTER — Ambulatory Visit (INDEPENDENT_AMBULATORY_CARE_PROVIDER_SITE_OTHER)
Admission: RE | Admit: 2018-05-12 | Discharge: 2018-05-12 | Disposition: A | Discharge status: Home / Self Care | Payer: PRIVATE HEALTH INSURANCE | Source: Ambulatory Visit | Attending: Family Medicine | Admitting: Family Medicine

## 2018-05-12 ENCOUNTER — Telehealth: Payer: Self-pay | Admitting: *Deleted

## 2018-05-12 ENCOUNTER — Other Ambulatory Visit (INDEPENDENT_AMBULATORY_CARE_PROVIDER_SITE_OTHER): Payer: PRIVATE HEALTH INSURANCE

## 2018-05-12 ENCOUNTER — Other Ambulatory Visit: Payer: Self-pay

## 2018-05-12 DIAGNOSIS — Z79899 Other long term (current) drug therapy: Secondary | ICD-10-CM | POA: Diagnosis not present

## 2018-05-12 DIAGNOSIS — Z125 Encounter for screening for malignant neoplasm of prostate: Secondary | ICD-10-CM

## 2018-05-12 DIAGNOSIS — IMO0001 Reserved for inherently not codable concepts without codable children: Secondary | ICD-10-CM

## 2018-05-12 DIAGNOSIS — E1165 Type 2 diabetes mellitus with hyperglycemia: Secondary | ICD-10-CM

## 2018-05-12 DIAGNOSIS — E785 Hyperlipidemia, unspecified: Secondary | ICD-10-CM

## 2018-05-12 DIAGNOSIS — Z1159 Encounter for screening for other viral diseases: Secondary | ICD-10-CM

## 2018-05-12 DIAGNOSIS — R1032 Left lower quadrant pain: Secondary | ICD-10-CM

## 2018-05-12 LAB — COMPREHENSIVE METABOLIC PANEL
ALT: 22 U/L (ref 0–53)
AST: 16 U/L (ref 0–37)
Albumin: 4.3 g/dL (ref 3.5–5.2)
Alkaline Phosphatase: 58 U/L (ref 39–117)
BUN: 18 mg/dL (ref 6–23)
CO2: 29 mEq/L (ref 19–32)
Calcium: 9.2 mg/dL (ref 8.4–10.5)
Chloride: 103 mEq/L (ref 96–112)
Creatinine, Ser: 0.81 mg/dL (ref 0.40–1.50)
GFR: 96.77 mL/min (ref >60.00)
Glucose, Bld: 225 mg/dL — ABNORMAL HIGH (ref 70–99)
Potassium: 4.2 mEq/L (ref 3.5–5.1)
Sodium: 140 mEq/L (ref 135–145)
Total Bilirubin: 0.8 mg/dL (ref 0.2–1.2)
Total Protein: 6.6 g/dL (ref 6.0–8.3)

## 2018-05-12 LAB — CBC WITH DIFFERENTIAL/PLATELET
Basophils Absolute: 0 10*3/uL (ref 0.0–0.1)
Basophils Relative: 0.3 % (ref 0.0–3.0)
Eosinophils Absolute: 0.1 10*3/uL (ref 0.0–0.7)
Eosinophils Relative: 1.3 % (ref 0.0–5.0)
HCT: 42.1 % (ref 39.0–52.0)
Hemoglobin: 14.4 g/dL (ref 13.0–17.0)
Lymphocytes Relative: 29.3 % (ref 12.0–46.0)
Lymphs Abs: 2.4 10*3/uL (ref 0.7–4.0)
MCHC: 34.1 g/dL (ref 30.0–36.0)
MCV: 90.6 fl (ref 78.0–100.0)
Monocytes Absolute: 0.4 10*3/uL (ref 0.1–1.0)
Monocytes Relative: 5.4 % (ref 3.0–12.0)
Neutro Abs: 5.3 10*3/uL (ref 1.4–7.7)
Neutrophils Relative %: 63.7 % (ref 43.0–77.0)
Platelets: 227 10*3/uL (ref 150.0–400.0)
RBC: 4.65 Mil/uL (ref 4.22–5.81)
RDW: 12.4 % (ref 11.5–15.5)
WBC: 8.3 10*3/uL (ref 4.0–10.5)

## 2018-05-12 LAB — LIPID PANEL
Cholesterol: 186 mg/dL (ref 0–200)
HDL: 40.6 mg/dL (ref >39.00)
LDL Cholesterol: 119 mg/dL — ABNORMAL HIGH (ref 0–99)
NonHDL: 145.12
Total CHOL/HDL Ratio: 5
Triglycerides: 130 mg/dL (ref 0.0–149.0)
VLDL: 26 mg/dL (ref 0.0–40.0)

## 2018-05-12 LAB — HEMOGLOBIN A1C: Hgb A1c MFr Bld: 9.4 % — ABNORMAL HIGH (ref 4.6–6.5)

## 2018-05-12 LAB — MICROALBUMIN / CREATININE URINE RATIO
Creatinine,U: 133.7 mg/dL
Microalb Creat Ratio: 0.5 mg/g (ref 0.0–30.0)
Microalb, Ur: <0.7 mg/dL (ref 0.0–1.9)

## 2018-05-12 MED ORDER — IOHEXOL 300 MG/ML  SOLN
100.0000 mL | Freq: Once | INTRAMUSCULAR | Status: AC | PRN
  Administered 2018-05-12: 14:00:00 100 mL via INTRAVENOUS

## 2018-05-12 NOTE — Telephone Encounter (Signed)
Covid-19 travel screening questions  Have you traveled in the last 14 days? If yes where? No Do you now or have you had a fever in the last 14 days? No Do you have any respiratory symptoms of shortness of breath or cough now or in the last 14 days? No Do you have any family members or close contacts with diagnosed or suspected Covid-19? No      

## 2018-05-15 ENCOUNTER — Encounter: Payer: Self-pay | Admitting: Family Medicine

## 2018-05-15 ENCOUNTER — Encounter: Payer: PRIVATE HEALTH INSURANCE | Admitting: Family Medicine

## 2018-05-15 LAB — HEPATITIS C ANTIBODY
Hepatitis C Ab: NONREACTIVE
SIGNAL TO CUT-OFF: 0.01 (ref <1.00)

## 2018-05-15 LAB — PSA, TOTAL AND FREE
PSA, % Free: 33 % (calc) (ref >25)
PSA, Free: 0.1 ng/mL
PSA, Total: 0.3 ng/mL (ref <=4.0)

## 2018-05-15 NOTE — Progress Notes (Signed)
Appointment 4/20 pt aware

## 2018-05-16 NOTE — Progress Notes (Signed)
This encounter was created in error - please disregard.

## 2018-07-21 ENCOUNTER — Telehealth: Payer: Self-pay

## 2018-07-21 NOTE — Telephone Encounter (Signed)
Labs 9/4 cpx 9/10

## 2018-07-21 NOTE — Telephone Encounter (Signed)
Copied from [place] [id number]. Topic: Appointment Scheduling - Scheduling Inquiry for Clinic >> Jul 20, 2018  4:13 PM Celene Kras A wrote: Reason for CRM: Pt called and would to push back physical and labs. Please advise.

## 2018-07-24 ENCOUNTER — Other Ambulatory Visit: Payer: Self-pay

## 2018-07-27 ENCOUNTER — Encounter: Payer: Self-pay | Admitting: Family Medicine

## 2018-09-06 ENCOUNTER — Other Ambulatory Visit: Payer: Self-pay | Admitting: Family Medicine

## 2018-09-29 ENCOUNTER — Telehealth: Payer: Self-pay | Admitting: Family Medicine

## 2018-09-29 ENCOUNTER — Other Ambulatory Visit (INDEPENDENT_AMBULATORY_CARE_PROVIDER_SITE_OTHER): Payer: PRIVATE HEALTH INSURANCE

## 2018-09-29 ENCOUNTER — Other Ambulatory Visit: Payer: Self-pay

## 2018-09-29 DIAGNOSIS — IMO0001 Reserved for inherently not codable concepts without codable children: Secondary | ICD-10-CM

## 2018-09-29 DIAGNOSIS — Z125 Encounter for screening for malignant neoplasm of prostate: Secondary | ICD-10-CM

## 2018-09-29 NOTE — Telephone Encounter (Signed)
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  Can you please place lab orders for today 9.4.2020.  He has a physical on Thursday 9.10.2020.  Please order for [facility] as the pt does not have insurance and [facility] will discount his labs for him.  Thank you  Irene Shipper

## 2018-09-29 NOTE — Telephone Encounter (Signed)
Done

## 2018-09-30 LAB — CBC WITH DIFFERENTIAL/PLATELET
Absolute Monocytes: 466 cells/uL (ref 200–950)
Basophils Absolute: 32 cells/uL (ref 0–200)
Basophils Relative: 0.4 %
Eosinophils Absolute: 158 cells/uL (ref 15–500)
Eosinophils Relative: 2 %
HCT: 42 % (ref 38.5–50.0)
Hemoglobin: 14.3 g/dL (ref 13.2–17.1)
Lymphs Abs: 2718 cells/uL (ref 850–3900)
MCH: 31.1 pg (ref 27.0–33.0)
MCHC: 34 g/dL (ref 32.0–36.0)
MCV: 91.3 fL (ref 80.0–100.0)
MPV: 10.8 fL (ref 7.5–12.5)
Monocytes Relative: 5.9 %
Neutro Abs: 4527 cells/uL (ref 1500–7800)
Neutrophils Relative %: 57.3 %
Platelets: 257 10*3/uL (ref 140–400)
RBC: 4.6 10*6/uL (ref 4.20–5.80)
RDW: 11.8 % (ref 11.0–15.0)
Total Lymphocyte: 34.4 %
WBC: 7.9 10*3/uL (ref 3.8–10.8)

## 2018-09-30 LAB — COMPREHENSIVE METABOLIC PANEL
AG Ratio: 1.8 (calc) (ref 1.0–2.5)
ALT: 18 U/L (ref 9–46)
AST: 14 U/L (ref 10–35)
Albumin: 4 g/dL (ref 3.6–5.1)
Alkaline phosphatase (APISO): 62 U/L (ref 35–144)
BUN: 13 mg/dL (ref 7–25)
CO2: 26 mmol/L (ref 20–32)
Calcium: 9.2 mg/dL (ref 8.6–10.3)
Chloride: 104 mmol/L (ref 98–110)
Creat: 0.86 mg/dL (ref 0.70–1.25)
Globulin: 2.2 g/dL (calc) (ref 1.9–3.7)
Glucose, Bld: 267 mg/dL — ABNORMAL HIGH (ref 65–99)
Potassium: 4.4 mmol/L (ref 3.5–5.3)
Sodium: 139 mmol/L (ref 135–146)
Total Bilirubin: 0.6 mg/dL (ref 0.2–1.2)
Total Protein: 6.2 g/dL (ref 6.1–8.1)

## 2018-09-30 LAB — PSA: PSA: 0.3 ng/mL (ref <=4.0)

## 2018-09-30 LAB — LIPID PANEL
Cholesterol: 172 mg/dL (ref <200)
HDL: 36 mg/dL — ABNORMAL LOW (ref >=40)
LDL Cholesterol (Calc): 110 mg/dL (calc) — ABNORMAL HIGH
Non-HDL Cholesterol (Calc): 136 mg/dL (calc) — ABNORMAL HIGH (ref <130)
Total CHOL/HDL Ratio: 4.8 (calc) (ref <5.0)
Triglycerides: 150 mg/dL — ABNORMAL HIGH (ref <150)

## 2018-09-30 LAB — HEMOGLOBIN A1C
Hgb A1c MFr Bld: 8.8 % of total Hgb — ABNORMAL HIGH (ref <5.7)
Mean Plasma Glucose: 206 (calc)
eAG (mmol/L): 11.4 (calc)

## 2018-10-05 ENCOUNTER — Other Ambulatory Visit: Payer: Self-pay

## 2018-10-05 ENCOUNTER — Encounter: Payer: Self-pay | Admitting: Family Medicine

## 2018-10-05 ENCOUNTER — Ambulatory Visit (INDEPENDENT_AMBULATORY_CARE_PROVIDER_SITE_OTHER): Payer: PRIVATE HEALTH INSURANCE | Admitting: Family Medicine

## 2018-10-05 ENCOUNTER — Other Ambulatory Visit: Payer: Self-pay | Admitting: Family Medicine

## 2018-10-05 VITALS — BP 118/72 | HR 84 | Temp 98.1°F | Ht 67.25 in | Wt 227.5 lb

## 2018-10-05 DIAGNOSIS — Z0000 Encounter for general adult medical examination without abnormal findings: Secondary | ICD-10-CM | POA: Diagnosis not present

## 2018-10-05 DIAGNOSIS — Z1211 Encounter for screening for malignant neoplasm of colon: Secondary | ICD-10-CM | POA: Diagnosis not present

## 2018-10-05 MED ORDER — SITAGLIPTIN PHOSPHATE 100 MG PO TABS: 100.0000 mg | ORAL_TABLET | Freq: Every day | ORAL | 1 refills | Status: DC

## 2018-10-05 NOTE — Progress Notes (Signed)
Micheal Cinquemani T. Micheal Casale, MD Primary Care and [place] at [facility] [name], [address] Phone: [phone]  FAX: [phone]  OTHOR VINES - 63 y.o. male  MRN [id number]  Date of Birth: [birth year]  Visit Date: 10/05/2018  PCP: Owens Loffler, MD  Referred by: Owens Loffler, MD  Chief Complaint  Patient presents with  . Annual Exam   Patient Care Team: Owens Loffler, MD as PCP - General (Family Medicine) Subjective:   Micheal Clark is a 63 y.o. pleasant patient who presents with the following:  Preventative Health Maintenance Visit:  Health Maintenance Summary Reviewed and updated, unless pt declines services.  Tobacco History Reviewed. 1 tin over 3 days.  Alcohol: No concerns, no excessive use Exercise Habits: Some activity, rec at least 30 mins 5 times a week STD concerns: no risk or activity to increase risk Drug Use: None Encouraged self-testicular check  Colon screening? Right now busy at work .   Lab Results  Component Value Date   HGBA1C 8.8 (H) 09/29/2018    maxed out at metf and glip Wants to get hip replacement  Wt Readings from Last 3 Encounters:  10/05/18 227 lb 8 oz (103.2 kg)  04/25/18 226 lb 12 oz (102.9 kg)  10/03/17 231 lb 12 oz (105.1 kg)     Health Maintenance  Topic Date Due  . OPHTHALMOLOGY EXAM  01/08/1967  . HIV Screening  01/08/1972  . COLONOSCOPY  11/24/2014  . FOOT EXAM  08/26/2015  . INFLUENZA VACCINE  04/25/2019 (Originally 08/26/2018)  . PNEUMOCOCCAL POLYSACCHARIDE VACCINE AGE 75-64 HIGH RISK  10/05/2019 (Originally 01/08/1959)  . TETANUS/TDAP  10/05/2019 (Originally 01/08/1976)  . HEMOGLOBIN A1C  03/29/2019  . URINE MICROALBUMIN  05/12/2019  . Hepatitis C Screening  Completed    There is no immunization history on file for this patient. Patient Active Problem List   Diagnosis Date Noted  . Mid back pain on left side 05/04/2018  . Low back pain  02/05/2016  . Post-traumatic osteoarthritis of left hip 06/26/2015  . Personal history of adenomatous colonic polyps 11/24/2011  . Diabetes mellitus type 2, uncontrolled, without complications ([place]) [id number]  . Hyperlipidemia LDL goal <70 11/15/2011  . Smokeless tobacco use 10/11/2011  . Obesity 10/11/2011   Past Medical History:  Diagnosis Date  . Diabetes mellitus without complication ([place])   . Diabetes type 2, uncontrolled ([place]) 11/15/2011  . Hyperlipidemia 11/15/2011  . Personal history of adenomatous colonic polyps 11/24/2011  . Smokeless tobacco use 10/11/2011   Past Surgical History:  Procedure Laterality Date  . ORIF ACETABULAR FRACTURE Left 1998   left  . WISDOM TOOTH EXTRACTION     Social History   Socioeconomic History  . Marital status: Married    Spouse name: Not on file  . Number of children: 2  . Years of education: Not on file  . Highest education level: Not on file  Occupational History  . 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 Financial resource strain: Not on file  . Food insecurity    Worry: Not on file    Inability: Not on file  . Transportation needs    Medical: Not on file    Non-medical: Not on file  Tobacco Use  . Smoking status: Former Smoker    Packs/day: 1.00    Years: 8.00    Pack years: 8.00  Types: Cigarettes    Quit date: 03/08/1980    Years since quitting: 38.6  . Smokeless tobacco: Current User    Types: Chew  . Tobacco comment: quit 1993  Substance and Sexual Activity  . Alcohol use: Yes    Comment: occassionally-not wekly  . Drug use: No  . Sexual activity: Not on file  Lifestyle  . Physical activity    Days per week: Not on file    Minutes per session: Not on file  . Stress: Not on file  Relationships  . Social Herbalist on phone: Not on file    Gets together: Not on file    Attends religious service: Not on file    Active member of club or  organization: Not on file    Attends meetings of clubs or organizations: Not on file    Relationship status: Not on file  . Intimate partner violence    Fear of current or ex partner: Not on file    Emotionally abused: Not on file    Physically abused: Not on file    Forced sexual activity: Not on file  Other Topics Concern  . Not on file  Social History Narrative  . Not on file   Family History  Problem Relation Age of Onset  . Diabetes Mother   . Hypertension Brother   . Vision loss Paternal Grandfather   . Colon cancer Neg Hx   . Rectal cancer Neg Hx   . Stomach cancer Neg Hx    No Known Allergies  Medication list has been reviewed and updated.   General: Denies fever, chills, sweats. No significant weight loss. Eyes: Denies blurring,significant itching ENT: Denies earache, sore throat, and hoarseness. Cardiovascular: Denies chest pains, palpitations, dyspnea on exertion Respiratory: Denies cough, dyspnea at rest,wheeezing Breast: no concerns about lumps GI: Denies nausea, vomiting, diarrhea, constipation, change in bowel habits, abdominal pain, melena, hematochezia GU: Denies penile discharge, ED, urinary flow / outflow problems. No STD concerns. Musculoskeletal: Denies back pain, joint pain Derm: Denies rash, itching Neuro: Denies  paresthesias, frequent falls, frequent headaches Psych: Denies depression, anxiety Endocrine: Denies cold intolerance, heat intolerance, polydipsia Heme: Denies enlarged lymph nodes Allergy: No hayfever  Objective:   BP 118/72   Pulse 84   Temp 98.1 F (36.7 C) (Temporal)   Ht 5' 7.25" (1.708 m)   Wt 227 lb 8 oz (103.2 kg)   SpO2 97%   BMI 35.37 kg/m  Ideal Body Weight: Weight in (lb) to have BMI = 25: 160.5  Ideal Body Weight: Weight in (lb) to have BMI = 25: 160.5 No exam data present Depression screen [facility] 2/9 10/05/2018 07/01/2016  Decreased Interest 0 1  Down, Depressed, Hopeless 0 0  PHQ - 2 Score 0 1     GEN: well  developed, well nourished, no acute distress Eyes: conjunctiva and lids normal, PERRLA, EOMI ENT: TM clear, nares clear, oral exam WNL Neck: supple, no lymphadenopathy, no thyromegaly, no JVD Pulm: clear to auscultation and percussion, respiratory effort normal CV: regular rate and rhythm, S1-S2, no murmur, rub or gallop, no bruits, peripheral pulses normal and symmetric, no cyanosis, clubbing, edema or varicosities GI: soft, non-tender; no hepatosplenomegaly, masses; active bowel sounds all quadrants GU: no hernia, testicular mass, penile discharge Lymph: no cervical, axillary or inguinal adenopathy MSK: gait normal, muscle tone and strength WNL, no joint swelling, effusions, discoloration, crepitus  SKIN: clear, good turgor, color WNL, no rashes, lesions, or ulcerations Neuro: normal  mental status, normal strength, sensation, and motion Psych: alert; oriented to person, place and time, normally interactive and not anxious or depressed in appearance. All labs reviewed with patient. Results for orders placed or performed in visit on 09/29/18  PSA  Result Value Ref Range   PSA 0.3 < OR = 4.0 ng/mL  CBC with Differential/Platelet  Result Value Ref Range   WBC 7.9 3.8 - 10.8 Thousand/uL   RBC 4.60 4.20 - 5.80 Million/uL   Hemoglobin 14.3 13.2 - 17.1 g/dL   HCT 42.0 38.5 - 50.0 %   MCV 91.3 80.0 - 100.0 fL   MCH 31.1 27.0 - 33.0 pg   MCHC 34.0 32.0 - 36.0 g/dL   RDW 11.8 11.0 - 15.0 %   Platelets 257 140 - 400 Thousand/uL   MPV 10.8 7.5 - 12.5 fL   Neutro Abs 4,527 1,500 - 7,800 cells/uL   Lymphs Abs 2,718 850 - 3,900 cells/uL   Absolute Monocytes 466 200 - 950 cells/uL   Eosinophils Absolute 158 15 - 500 cells/uL   Basophils Absolute 32 0 - 200 cells/uL   Neutrophils Relative % 57.3 %   Total Lymphocyte 34.4 %   Monocytes Relative 5.9 %   Eosinophils Relative 2.0 %   Basophils Relative 0.4 %  Comprehensive metabolic panel  Result Value Ref Range   Glucose, Bld 267 (H) 65 - 99  mg/dL   BUN 13 7 - 25 mg/dL   Creat 0.86 0.70 - 1.25 mg/dL   BUN/Creatinine Ratio NOT APPLICABLE 6 - 22 (calc)   Sodium 139 135 - 146 mmol/L   Potassium 4.4 3.5 - 5.3 mmol/L   Chloride 104 98 - 110 mmol/L   CO2 26 20 - 32 mmol/L   Calcium 9.2 8.6 - 10.3 mg/dL   Total Protein 6.2 6.1 - 8.1 g/dL   Albumin 4.0 3.6 - 5.1 g/dL   Globulin 2.2 1.9 - 3.7 g/dL (calc)   AG Ratio 1.8 1.0 - 2.5 (calc)   Total Bilirubin 0.6 0.2 - 1.2 mg/dL   Alkaline phosphatase (APISO) 62 35 - 144 U/L   AST 14 10 - 35 U/L   ALT 18 9 - 46 U/L  Lipid panel  Result Value Ref Range   Cholesterol 172 <200 mg/dL   HDL 36 (L) > OR = 40 mg/dL   Triglycerides 150 (H) <150 mg/dL   LDL Cholesterol (Calc) 110 (H) mg/dL (calc)   Total CHOL/HDL Ratio 4.8 <5.0 (calc)   Non-HDL Cholesterol (Calc) 136 (H) <130 mg/dL (calc)  Hemoglobin A1c  Result Value Ref Range   Hgb A1c MFr Bld 8.8 (H) <5.7 % of total Hgb   Mean Plasma Glucose 206 (calc)   eAG (mmol/L) 11.4 (calc)    Assessment and Plan:     ICD-10-CM   1. Healthcare maintenance  Z00.00   2. Screen for colon cancer  Z12.11 Ambulatory referral to Gastroenterology   Beverly needs to get his blood sugars under better control, because he needs to have a total hip replacement.  I been a place him on maximum strength Januvia, and we will do a follow-up in 3 months and recheck his A1c.  Health Maintenance Exam: The patient's preventative maintenance and recommended screening tests for an annual wellness exam were reviewed in full today. Brought up to date unless services declined.  Counselled on the importance of diet, exercise, and its role in overall health and mortality. The patient's FH and SH was reviewed, including their home life, tobacco  status, and drug and alcohol status.  Follow-up in 1 year for physical exam or additional follow-up below.  Follow-up: No follow-ups on file. Or follow-up in 1 year if not noted.  Meds ordered this encounter  Medications   . sitaGLIPtin (JANUVIA) 100 MG tablet    Sig: Take 1 tablet (100 mg total) by mouth daily.    Dispense:  90 tablet    Refill:  1   Medications Discontinued During This Encounter  Medication Reason  . diclofenac (VOLTAREN) 75 MG EC tablet Completed Course   Orders Placed This Encounter  Procedures  . Ambulatory referral to Gastroenterology    Signed,  Frederico Hamman T. Reana Chacko, MD   Allergies as of 10/05/2018   No Known Allergies     Medication List       Accurate as of October 05, 2018 11:59 PM. If you have any questions, ask your nurse or doctor.        STOP taking these medications   diclofenac 75 MG EC tablet Commonly known as: VOLTAREN Stopped by: Owens Loffler, MD     TAKE these medications   aspirin 81 MG tablet Take 81 mg by mouth daily.   glipiZIDE 10 MG 24 hr tablet Commonly known as: GLUCOTROL XL TAKE TWO TABLETS BY MOUTH EVERY MORNING WITH BREAKFAST   metFORMIN 500 MG 24 hr tablet Commonly known as: GLUCOPHAGE-XR TAKE FOUR TABLETS BY MOUTH EVERY MORNING WITH BREAKFAST   pioglitazone 45 MG tablet Commonly known as: Actos Take 1 tablet (45 mg total) by mouth daily.   simvastatin 40 MG tablet Commonly known as: ZOCOR TAKE ONE TABLET BY MOUTH EVERY NIGHT AT BEDTIME   sitaGLIPtin 100 MG tablet Commonly known as: Januvia Take 1 tablet (100 mg total) by mouth daily. Started by: Owens Loffler, MD   vitamin B-12 1000 MCG tablet Commonly known as: CYANOCOBALAMIN Take 1,000 mcg by mouth daily.

## 2018-11-08 ENCOUNTER — Encounter: Payer: Self-pay | Admitting: Family Medicine

## 2019-05-30 ENCOUNTER — Ambulatory Visit: Payer: PRIVATE HEALTH INSURANCE | Admitting: Family Medicine

## 2019-05-30 ENCOUNTER — Other Ambulatory Visit: Payer: Self-pay

## 2019-05-30 ENCOUNTER — Encounter: Payer: Self-pay | Admitting: Family Medicine

## 2019-05-30 ENCOUNTER — Ambulatory Visit (INDEPENDENT_AMBULATORY_CARE_PROVIDER_SITE_OTHER)
Admission: RE | Admit: 2019-05-30 | Discharge: 2019-05-30 | Disposition: A | Discharge status: Home / Self Care | Payer: PRIVATE HEALTH INSURANCE | Source: Ambulatory Visit | Attending: Family Medicine | Admitting: Family Medicine

## 2019-05-30 VITALS — BP 120/80 | HR 94 | Temp 98.4°F | Ht 67.25 in | Wt 219.8 lb

## 2019-05-30 DIAGNOSIS — E119 Type 2 diabetes mellitus without complications: Secondary | ICD-10-CM

## 2019-05-30 DIAGNOSIS — E669 Obesity, unspecified: Secondary | ICD-10-CM

## 2019-05-30 DIAGNOSIS — M1712 Unilateral primary osteoarthritis, left knee: Secondary | ICD-10-CM

## 2019-05-30 LAB — POCT GLYCOSYLATED HEMOGLOBIN (HGB A1C): Hemoglobin A1C: 10.2 % — AB (ref 4.0–5.6)

## 2019-05-30 NOTE — Patient Instructions (Addendum)
SET UP YOUR PHONE FOR AN ALARM AT NIGHT TO TAKE YOUR MEDICINE  GET A PILL BOX  Look up Good Rx on the computer to find the cheapest medicine  [employer] also has a program for reduced medicine

## 2019-05-30 NOTE — Progress Notes (Signed)
Codie Hainer T. Oliva Montecalvo, MD, [place] at [facility] [place], [address]  Phone: [phone]  FAX: [phone]  Micheal Clark - 64 y.o. male  MRN [id number]  Date of Birth: 1955-08-12  Date: 05/30/2019  PCP: Owens Loffler, MD  Referral: Owens Loffler, MD  Chief Complaint  Patient presents with  . Knee Pain    Left    This visit occurred during the SARS-CoV-2 public health emergency.  Safety protocols were in place, including screening questions prior to the visit, additional usage of staff PPE, and extensive cleaning of exam room while observing appropriate contact time as indicated for disinfecting solutions.   Subjective:   Micheal Clark is a 64 y.o. very pleasant male patient with Body mass index is 34.16 kg/m. who presents with the following:  H/o L ORIF at acetabulum of his and now he is having persistent L knee pain.  Hurts and has gotten worse during the day.  For about a month or so.  He has known severe osteoarthritis of the left hip.  He is spoken with the left hip specialist from [facility], and they do not want to do a hip replacement on him unless his diabetes is under much better control.  He does tell me that he forgets his medication at least 2 or 3 times a week.  Sometimes this is more.  He has not had any trauma or injury to the affected knee.  He does walk with a limp generally, predominantly from his hip pain.  Does have a BMI of 34.  No injury.  Wt Readings from Last 3 Encounters:  05/30/19 219 lb 12 oz (99.7 kg)  10/05/18 227 lb 8 oz (103.2 kg)  04/25/18 226 lb 12 oz (102.9 kg)    Lab Results  Component Value Date   HGBA1C 10.2 (A) 05/30/2019     Review of Systems is noted in the HPI, as appropriate   Objective:   BP 120/80   Pulse 94   Temp 98.4 F (36.9 C) (Temporal)   Ht 5' 7.25" (1.708 m)   Wt 219 lb 12 oz (99.7 kg)   SpO2 95%    BMI 34.16 kg/m    GEN: No acute distress; alert,appropriate. PULM: Breathing comfortably in no respiratory distress PSYCH: Normally interactive.   Knee: Left Gait: Antalgic gait on the left  ROM: 0-125 Effusion: neg Echymosis or edema: none Patellar tendon NT Painful PLICA: neg Patellar grind: negative Medial and lateral patellar facet loading: negative medial and lateral joint lines: Modest medially  Mcmurray's neg Flexion-pinch neg Varus and valgus stress: stable Lachman: neg Ant and Post drawer: neg Hip abduction, IR, ER: WNL Hip flexion str: 5/5 Hip abd: 5/5 Quad: 5/5 VMO atrophy:No Hamstring concentric and eccentric: 5/5   Radiology: DG Knee 4 Views W/Patella Left  Result Date: 05/30/2019 CLINICAL DATA:  Evaluate osteoarthritis EXAM: LEFT KNEE - COMPLETE 4+ VIEW COMPARISON:  None. FINDINGS: No evidence of fracture, dislocation, or joint effusion. No evidence of arthropathy. 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 IMPRESSION: No fracture or dislocation of the left knee. Joint spaces are preserved. Electronically Signed   By: Eddie Candle M.D.   On: 05/30/2019 20:52    Assessment and Plan:     ICD-10-CM   1. Primary osteoarthritis of left knee  M17.12 DG Knee 4 Views W/Patella Left  2. Controlled type  2 diabetes mellitus without complication, without long-term current use of insulin (HCC)  E11.9 POCT glycosylated hemoglobin (Hb A1C)  3. Obesity (BMI 30.0-34.9)  E66.9    Minimal arthritis, I think that this is all secondary to his severe hip arthritis.  Needs to get his diabetes under better control so that he could possibly get a total hip arthroplasty.  Social Determinants of Health: significant limited.  Generally this can include but is not limited to economic stability, education access and quality, healthcare access and quality, neighborhood and built environment, and social and community  context. Financially, he is unable to pay for his Januvia.  He has not been all that compliant and some of this is due to finances.  I got him a good Rx card in showed him some other places where he can get his medicines for quite a bit cheaper.  Follow-up: No follow-ups on file.  No orders of the defined types were placed in this encounter.  Medications Discontinued During This Encounter  Medication Reason  . sitaGLIPtin (JANUVIA) 100 MG tablet Cost of medication   Orders Placed This Encounter  Procedures  . DG Knee 4 Views W/Patella Left  . POCT glycosylated hemoglobin (Hb A1C)    Signed,  Joeline Freer T. Kiasia Chou, MD   Outpatient Encounter Medications as of 05/30/2019  Medication Sig  . aspirin 81 MG tablet Take 81 mg by mouth daily.  Marland Kitchen glipiZIDE (GLUCOTROL XL) 10 MG 24 hr tablet TAKE TWO TABLETS BY MOUTH EVERY MORNING WITH BREAKFAST  . simvastatin (ZOCOR) 40 MG tablet TAKE ONE TABLET BY MOUTH EVERY NIGHT AT BEDTIME  . vitamin B-12 (CYANOCOBALAMIN) 1000 MCG tablet Take 1,000 mcg by mouth daily.   . [DISCONTINUED] metFORMIN (GLUCOPHAGE-XR) 500 MG 24 hr tablet TAKE FOUR TABLETS BY MOUTH EVERY MORNING WITH BREAKFAST  . [DISCONTINUED] pioglitazone (ACTOS) 45 MG tablet Take 1 tablet (45 mg total) by mouth daily.  . [DISCONTINUED] sitaGLIPtin (JANUVIA) 100 MG tablet Take 1 tablet (100 mg total) by mouth daily.   No facility-administered encounter medications on file as of 05/30/2019.

## 2019-05-31 ENCOUNTER — Other Ambulatory Visit: Payer: Self-pay | Admitting: Family Medicine

## 2019-06-27 ENCOUNTER — Other Ambulatory Visit: Payer: Self-pay | Admitting: Family Medicine

## 2019-09-03 ENCOUNTER — Ambulatory Visit: Payer: PRIVATE HEALTH INSURANCE | Admitting: Family Medicine

## 2019-09-06 ENCOUNTER — Ambulatory Visit: Payer: PRIVATE HEALTH INSURANCE | Admitting: Family Medicine

## 2019-09-06 ENCOUNTER — Encounter: Payer: Self-pay | Admitting: Family Medicine

## 2019-09-06 ENCOUNTER — Other Ambulatory Visit: Payer: Self-pay

## 2019-09-06 VITALS — BP 106/64 | HR 68 | Temp 97.6°F | Ht 67.25 in | Wt 237.0 lb

## 2019-09-06 DIAGNOSIS — E785 Hyperlipidemia, unspecified: Secondary | ICD-10-CM | POA: Diagnosis not present

## 2019-09-06 DIAGNOSIS — E119 Type 2 diabetes mellitus without complications: Secondary | ICD-10-CM

## 2019-09-06 LAB — POCT GLYCOSYLATED HEMOGLOBIN (HGB A1C): Hemoglobin A1C: 6.6 % — AB (ref 4.0–5.6)

## 2019-09-06 MED ORDER — GLIPIZIDE ER 10 MG PO TB24: ORAL_TABLET | ORAL | 3 refills | Status: DC

## 2019-09-06 MED ORDER — SIMVASTATIN 40 MG PO TABS: 40.0000 mg | ORAL_TABLET | Freq: Every day | ORAL | 3 refills | Status: DC

## 2019-09-06 NOTE — Progress Notes (Signed)
Micheal Clark. Micheal Mckenzie, MD, Micheal Clark at [facility] [place], [address]  Phone: [phone]  FAX: [phone]  Micheal Clark - 64 y.o. male  MRN [id number]  Date of Birth: 1955/08/26  Date: 09/06/2019  PCP: Owens Loffler, MD  Referral: Owens Loffler, MD  Chief Complaint  Patient presents with  . Diabetes    3 month follow-up. Dos not check levels at home often.    This visit occurred during the SARS-CoV-2 public health emergency.  Safety protocols were in place, including screening questions prior to the visit, additional usage of staff PPE, and extensive cleaning of exam room while observing appropriate contact time as indicated for disinfecting solutions.   Subjective:   Micheal Clark is a 64 y.o. very pleasant male patient with Body mass index is 36.84 kg/m. who presents with the following:  Diabetes Mellitus: Tolerating Medications: yes Compliance with diet: fair, Body mass index is 36.84 kg/m. Exercise: minimal / intermittent Avg blood sugars at home: not checking Foot problems: none Hypoglycemia: none No nausea, vomitting, blurred vision, polyuria.  Lab Results  Component Value Date   HGBA1C 6.6 (A) 09/06/2019   HGBA1C 10.2 (A) 05/30/2019   HGBA1C 8.8 (H) 09/29/2018   Lab Results  Component Value Date   MICROALBUR <0.7 05/12/2018   LDLCALC 110 (H) 09/29/2018   CREATININE 0.86 09/29/2018    Wt Readings from Last 3 Encounters:  09/06/19 237 lb (107.5 kg)  05/30/19 219 lb 12 oz (99.7 kg)  10/05/18 227 lb 8 oz (103.2 kg)    Lipids: Doing well, stable. Tolerating meds fine with no SE. Panel reviewed with patient.  Lipids:    Component Value Date/Time   CHOL 172 09/29/2018 1321   TRIG 150 (H) 09/29/2018 1321   HDL 36 (L) 09/29/2018 1321   LDLDIRECT 162.9 10/11/2011 1445   VLDL 26.0 05/12/2018 0822   CHOLHDL 4.8 09/29/2018 1321    Lab Results   Component Value Date   ALT 18 09/29/2018   AST 14 09/29/2018   ALKPHOS 58 05/12/2018   BILITOT 0.6 09/29/2018     Review of Systems is noted in the HPI, as appropriate  Objective:   BP 106/64 (BP Location: Left Arm, Patient Position: Sitting, Cuff Size: Large)   Pulse 68   Temp 97.6 F (36.4 C)   Ht 5' 7.25" (1.708 m)   Wt 237 lb (107.5 kg)   SpO2 99%   BMI 36.84 kg/m   GEN: No acute distress; alert,appropriate. PULM: Breathing comfortably in no respiratory distress PSYCH: Normally interactive.  CV: RRR, no m/g/r  PULM: Normal respiratory rate, no accessory muscle use. No wheezes, crackles or rhonchi   Laboratory and Imaging Data: Results for orders placed or performed in visit on 09/06/19  HgB A1c  Result Value Ref Range   Hemoglobin A1C 6.6 (A) 4.0 - 5.6 %   HbA1c POC (<> result, manual entry)     HbA1c, POC (prediabetic range)     HbA1c, POC (controlled diabetic range)       Assessment and Plan:     ICD-10-CM   1. Controlled type 2 diabetes mellitus without complication, without long-term current use of insulin (HCC)  E11.9 HgB A1c  2. Hyperlipidemia LDL goal <70  E78.5    Blood sugar is significantly improved with an A1c now 6 6.  The last time I saw him his A1c was greater than 10,  and he has been much more compliant.  Social Determinants of Health    Physical Activity:  . Days of Exercise per Week:  None . Minutes of Exercise per Session:    Follow-up: Return in about 6 months (around 03/08/2020).  Meds ordered this encounter  Medications  . glipiZIDE (GLUCOTROL XL) 10 MG 24 hr tablet    Sig: TAKE TWO TABLETS BY MOUTH EVERY MORNING WITH BREAKFAST    Dispense:  180 tablet    Refill:  3  . simvastatin (ZOCOR) 40 MG tablet    Sig: Take 1 tablet (40 mg total) by mouth at bedtime.    Dispense:  90 tablet    Refill:  3   Medications Discontinued During This Encounter  Medication Reason  . glipiZIDE (GLUCOTROL XL) 10 MG 24 hr tablet Reorder  .  simvastatin (ZOCOR) 40 MG tablet Reorder   Orders Placed This Encounter  Procedures  . HgB A1c    Signed,  Eulice Rutledge Clark. Dionna Wiedemann, MD   Outpatient Encounter Medications as of 09/06/2019  Medication Sig  . aspirin 81 MG tablet Take 81 mg by mouth daily.  Marland Kitchen glipiZIDE (GLUCOTROL XL) 10 MG 24 hr tablet TAKE TWO TABLETS BY MOUTH EVERY MORNING WITH BREAKFAST  . metFORMIN (GLUCOPHAGE-XR) 500 MG 24 hr tablet TAKE 4 TABLETS EVERY MORNING WITH FOOD  . pioglitazone (ACTOS) 45 MG tablet TAKE ONE TABLET BY MOUTH DAILY  . simvastatin (ZOCOR) 40 MG tablet Take 1 tablet (40 mg total) by mouth at bedtime.  . vitamin B-12 (CYANOCOBALAMIN) 1000 MCG tablet Take 1,000 mcg by mouth daily.   . [DISCONTINUED] glipiZIDE (GLUCOTROL XL) 10 MG 24 hr tablet TAKE TWO TABLETS BY MOUTH EVERY MORNING WITH BREAKFAST  . [DISCONTINUED] simvastatin (ZOCOR) 40 MG tablet TAKE ONE TABLET BY MOUTH EVERY NIGHT AT BEDTIME   No facility-administered encounter medications on file as of 09/06/2019.

## 2019-12-10 ENCOUNTER — Other Ambulatory Visit: Payer: Self-pay | Admitting: Family Medicine

## 2019-12-10 ENCOUNTER — Telehealth: Payer: Self-pay

## 2019-12-10 MED ORDER — TRAMADOL HCL 50 MG PO TABS: 50.0000 mg | ORAL_TABLET | Freq: Three times a day (TID) | ORAL | 1 refills | Status: DC | PRN

## 2019-12-10 NOTE — Telephone Encounter (Signed)
Pt left v/m requesting pain med for hip to [facility]. I spoke with pt; pt was seen at [facility] and orthopedics and sports med on 12/05/19 with chronic lt hip pain. Pt is to return to [place] on 01/23/20 with weight loss of 15 lbs so pt will be eligible for total hip replacement.  Pt said he did not ask ortho for pain med and they did not offer pain med. Pt said has seen Dr Lorelei Pont several times for hip pain and hopes Dr Lorelei Pont will call in pain med for lt hip pain. Pt request cb after Dr Lorelei Pont reviews this note.

## 2019-12-10 NOTE — Telephone Encounter (Signed)
I know Micheal Clark very well.  He has severe post-traumatic hip OA.  I have given him some tramadol for his hip before, and I am comfortable doing this again.

## 2019-12-11 NOTE — Telephone Encounter (Signed)
I sent him in some tramadol

## 2020-03-10 DIAGNOSIS — Z96642 Presence of left artificial hip joint: Secondary | ICD-10-CM | POA: Insufficient documentation

## 2020-03-13 ENCOUNTER — Ambulatory Visit: Payer: 59 | Attending: Physician Assistant

## 2020-03-13 ENCOUNTER — Other Ambulatory Visit: Payer: Self-pay

## 2020-03-13 DIAGNOSIS — M6281 Muscle weakness (generalized): Secondary | ICD-10-CM | POA: Insufficient documentation

## 2020-03-13 DIAGNOSIS — M25552 Pain in left hip: Secondary | ICD-10-CM | POA: Diagnosis not present

## 2020-03-13 DIAGNOSIS — R262 Difficulty in walking, not elsewhere classified: Secondary | ICD-10-CM | POA: Diagnosis present

## 2020-03-13 NOTE — Therapy (Signed)
[facility] [address], [place], [address] Phone: [phone]   Fax:  [phone]  Physical Therapy Evaluation  Patient Details  Name: Micheal Clark MRN: [id number] Date of Birth: 15-Jul-1955 Referring Provider (PT): Rema Jasmine, MD   Encounter Date: 03/13/2020   PT End of Session - 03/13/20 1514    Visit Number 1    Number of Visits 25    Date for PT Re-Evaluation 06/05/20    PT Start Time 6644    PT Stop Time 1555    PT Time Calculation (min) 40 min    Activity Tolerance Patient tolerated treatment well    Behavior During Therapy [facility] for tasks assessed/performed           Past Medical History:  Diagnosis Date  . Diabetes mellitus without complication ([place])   . Diabetes type 2, uncontrolled ([place]) 11/15/2011  . Hyperlipidemia 11/15/2011  . Personal history of adenomatous colonic polyps 11/24/2011  . Smokeless tobacco use 10/11/2011    Past Surgical History:  Procedure Laterality Date  . ORIF ACETABULAR FRACTURE Left 1998   left  . WISDOM TOOTH EXTRACTION      There were no vitals filed for this visit.    Subjective Assessment - 03/13/20 1519    Subjective L hip 2-3/10 pain currently.    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 Had a [place] 2000. Had L hip reconstruction. L hip got to bone on bone and surgery was the only option. Used a crutch R side secondary to L hip pain prior to surgery. No falls within the last 6 months. Fear of falling limites activities, but does not prevent pt from leaving his home. Pt lives in a 1 story home with wife. 4 steps to enter front door with B rail, 5 steps back door, no rails.    Patient Stated Goals Walk normally.    Currently in Pain? Yes    Pain Score 2     Pain Location Hip    Pain Orientation Left    Pain Descriptors / Indicators Sore    Pain Type Acute pain;Chronic pain;Surgical pain               [facility] PT Assessment  - 03/13/20 1504      Assessment   Medical Diagnosis L THA posterior approach    Referring Provider (PT) Rema Jasmine, MD    Onset Date/Surgical Date 03/10/20    Hand Dominance Right    Next MD Visit March 25, 2020    Prior Therapy Acute care PT      Precautions   Precaution Comments Posterior hip      Restrictions   Other Position/Activity Restrictions WAT      Balance Screen   Has the patient fallen in the past 6 months No    Has the patient had a decrease in activity level because of a fear of falling?  Yes    Is the patient reluctant to leave their home because of a fear of falling?  No      Posture/Postural Control   Posture Comments Pt standing: R weight shift, forward flexed, decreased B hip extesnion      PROM   Left Hip Extension -8   supine   Left Hip Flexion 50   supine   Left Hip ABduction 7   supine; stiff end feel     Strength   Overall Strength Comments Not  assessed at this time                      Objective measurements completed on examination: See above findings.   Antalgic, decrease stance L LE with RW   No BP problems per pt.  No latex allergies  reviewed hip precautions with pt.    Medbridge Access Code [email or web address]   Therapeutic exercise  Sit <> supine transfer min A with R LE and PT assist for surgical side 1x  Supine L hip abduction PROM with PT 10x3  Supine L hip flexion PROM with PT 10x3  Supine glute max squeeze 10x5 seconds   SAQ 10x3 L   Improved exercise technique, movement at target joints, use of target muscles after mod verbal, visual, tactile cues.    Response to treatment Pt tolerated session well without aggravation of symptoms.   Clinical impression Pt is a 65 year old male who came to physical therapy S/P L THA on 03/10/2020 posterior approach. Pt also demonstrates pain, altered posture, limited ROM, stiffness, weakness, altered gait pattern, and difficulty performing functional tasks, transfers,  ambulation, and stair negotiation. Pt will benefit from skilled physical therapy services to address the aforementioned deficits.           PT Short Term Goals - 03/13/20 1754      PT SHORT TERM GOAL #1   Title Pt will be independent with his initial HEP to improve ROM, strength, and ability to ambulate with less diffiulty.    Baseline Pt has started his HEP (03/13/2020)    Time 3    Period Weeks    Status New    Target Date 04/03/20             PT Long Term Goals - 03/13/20 1755      PT LONG TERM GOAL #1   Title Patient will improve his L hip extension PROM to 0 degrees, flexion to 90 degrees, and abduction to 30 degrees to promote ability to perform standing tasks with less difficulty.    Baseline PROM L hip extension - 8 degrees, hip flexion 50 degrees, hip abduction 7 degrees (03/13/2020)    Time 12    Period Weeks    Status New    Target Date 06/05/20      PT LONG TERM GOAL #2   Title Patient will have at least 4/5 L hip abduction, flexion, and extension strength to promote ability to ambulate with less difficulty.    Baseline L hip strength not yet tested secondary to being only 3 days post op (03/13/2020)    Time 12    Period Weeks    Status New    Target Date 06/05/20      PT LONG TERM GOAL #3   Title Patient will be able to ambulate at least 500 ft without AD to promote mobility.    Baseline Pt currently ambulates with rw (03/13/2020)    Time 12    Period Weeks    Status New    Target Date 06/05/20      PT LONG TERM GOAL #4   Title Patient will improve his FOTO score by at least 10 points as a demonstration of improved function.    Baseline Hip FOTO 30 (03/13/2020)    Time 12    Period Weeks    Status New    Target Date 06/05/20  Plan - 03/13/20 1610    Clinical Impression Statement Pt is a 65 year old male who came to physical therapy S/P L THA on 03/10/2020 posterior approach. Pt also demonstrates pain, altered posture, limited  ROM, stiffness, weakness, altered gait pattern, and difficulty performing functional tasks, transfers, ambulation, and stair negotiation. Pt will benefit from skilled physical therapy services to address the aforementioned deficits.    Personal Factors and Comorbidities Past/Current Experience;Comorbidity 1    Comorbidities DM    Examination-Activity Limitations Transfers;Bed Mobility;Squat;Stairs    Stability/Clinical Decision Making Stable/Uncomplicated    Clinical Decision Making Low    Rehab Potential Good    PT Frequency 2x / week    PT Duration 12 weeks    PT Treatment/Interventions Therapeutic activities;Functional mobility training;Therapeutic exercise;Balance training;Neuromuscular re-education;Patient/family education;Manual techniques;Dry needling;Passive range of motion;Aquatic Therapy;Electrical Stimulation;Iontophoresis 4mg /ml Dexamethasone;Gait training;Stair training    PT Next Visit Plan Maintain precautions. PROM. A/AROM, functional strengthening, manual techniques, modalities PRN    PT Home Exercise Plan Medbridge Access Code [email or web address]    Consulted and Agree with Plan of Care Patient           Patient will benefit from skilled therapeutic intervention in order to improve the following deficits and impairments:  Pain,Postural dysfunction,Improper body mechanics,Abnormal gait,Decreased range of motion,Decreased strength,Difficulty walking,Increased fascial restricitons  Visit Diagnosis: Pain in left hip - Plan: PT plan of care cert/re-cert  Muscle weakness (generalized) - Plan: PT plan of care cert/re-cert  Difficulty in walking, not elsewhere classified - Plan: PT plan of care cert/re-cert     Problem List Patient Active Problem List   Diagnosis Date Noted  . Post-traumatic osteoarthritis of left hip 06/26/2015  . Personal history of adenomatous colonic polyps 11/24/2011  . Controlled type 2 diabetes mellitus without complication, without long-term current use of  insulin ([place]) 11/15/2011  . Hyperlipidemia LDL goal <70 11/15/2011  . Smokeless tobacco use 10/11/2011  . Obesity 10/11/2011    Joneen Boers PT, DPT   03/13/2020, 6:51 PM  [facility] [address], [place], [address] Phone: [phone]   Fax:  [phone]  Name: Micheal Clark MRN: [id number] Date of Birth: [birth year]

## 2020-03-20 ENCOUNTER — Ambulatory Visit: Payer: 59

## 2020-03-20 ENCOUNTER — Other Ambulatory Visit: Payer: Self-pay

## 2020-03-20 DIAGNOSIS — M25552 Pain in left hip: Secondary | ICD-10-CM

## 2020-03-20 DIAGNOSIS — R262 Difficulty in walking, not elsewhere classified: Secondary | ICD-10-CM

## 2020-03-20 DIAGNOSIS — M6281 Muscle weakness (generalized): Secondary | ICD-10-CM

## 2020-03-20 NOTE — Patient Instructions (Signed)
Access Code: [email or web address] URL: [email or web address] Date: 03/20/2020 Prepared by: Joneen Boers  Exercises Supine Hip Abduction PROM with Caregiver - 3 x daily - 7 x weekly - 3 sets - 10 reps Supine Knee Extension Strengthening - 3 x daily - 7 x weekly - 3 sets - 10 reps Supine Quad Set - 3 x daily - 7 x weekly - 3 sets - 10 reps - 5 seconds hold Heel Toe Raises with Counter Support - 1 x daily - 7 x weekly - 3 sets - 10 reps

## 2020-03-20 NOTE — Therapy (Signed)
[place] [facility] [address] [place], [address] Phone: [phone]   Fax:  [phone]  Physical Therapy Treatment  Patient Details  Name: Micheal Clark MRN: [id number] Date of Birth: Aug 16, 1955 Referring Provider (PT): Rema Jasmine, MD   Encounter Date: 03/20/2020   PT End of Session - 03/20/20 1102    Visit Number 2    Number of Visits 25    Date for PT Re-Evaluation 06/05/20    PT Start Time 1102    PT Stop Time 1145    PT Time Calculation (min) 43 min    Activity Tolerance Patient tolerated treatment well    Behavior During Therapy [facility] for tasks assessed/performed           Past Medical History:  Diagnosis Date  . Diabetes mellitus without complication ([place])   . Diabetes type 2, uncontrolled ([place]) 11/15/2011  . Hyperlipidemia 11/15/2011  . Personal history of adenomatous colonic polyps 11/24/2011  . Smokeless tobacco use 10/11/2011    Past Surgical History:  Procedure Laterality Date  . ORIF ACETABULAR FRACTURE Left 1998   left  . WISDOM TOOTH EXTRACTION      There were no vitals filed for this visit.   Subjective Assessment - 03/20/20 1104    Subjective Not really having hip pain. Just sore at his L thigh. The HEP is doing ok.    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 Had a [place] 2000. Had L hip reconstruction. L hip got to bone on bone and surgery was the only option. Used a crutch R side secondary to L hip pain prior to surgery. No falls within the last 6 months. Fear of falling limites activities, but does not prevent pt from leaving his home. Pt lives in a 1 story home with wife. 4 steps to enter front door with B rail, 5 steps back door, no rails.    Patient Stated Goals Walk normally.    Currently in Pain? No/denies                                     PT Education - 03/20/20 1109    Education Details ther-ex    Person(s) Educated  Patient    Methods Explanation;Demonstration;Tactile cues;Verbal cues    Comprehension Returned demonstration;Verbalized understanding            Objective   Antalgic, decrease stance L LE with RW   No BP problems per pt.  No latex allergies reviewed hip precautions with pt.     DOS: 03/10/2020  S/P 1.5 weeks   Medbridge Access Code [email or web address]   Therapeutic exercise  Posterior hip precautions maintained:  Sit <> supine transfer min A with PT assist for surgical side 1x  Supine L hip abduction PROM with PT 10x3  Supine L hip flexion PROM with PT 10x3 (within posterior hip precautions)   Quad set 10x3 with 5 second holds  Supine glute max squeeze 10x5 seconds for 3 sets  (-) calf squeeze  Seated L heel digs (isometric hamstrings activation) 10x3 with 5 second holds  Seated hip extension isometrics 10x5 seconds for 3 sets  Standing heel toe raises with B UE assist  10x3   Standing L gastroc stretch with B UE assist 30 seconds x 3  Standing L LE weight shifting with B UE assist 10x with 5 second holds,  then 5x5 seconds      Improved exercise technique, movement at target joints, use of target muscles after mod verbal, visual, tactile cues.    Response to treatment Pt tolerated session well without aggravation of symptoms.   Clinical impression Continued working on hip PROM within precautions as well as gentle hip muscle activation and weight bearing to promote ability to stand and ambulate with less difficulty. Pt tolerated session well without aggravation of symptoms. Pt will benefit from continued skilled physical therapy services to improve strength, ROM, and function.     PT Short Term Goals - 03/13/20 1754      PT SHORT TERM GOAL #1   Title Pt will be independent with his initial HEP to improve ROM, strength, and ability to ambulate with less diffiulty.    Baseline Pt has started his HEP (03/13/2020)    Time 3    Period Weeks    Status New     Target Date 04/03/20             PT Long Term Goals - 03/13/20 1755      PT LONG TERM GOAL #1   Title Patient will improve his L hip extension PROM to 0 degrees, flexion to 90 degrees, and abduction to 30 degrees to promote ability to perform standing tasks with less difficulty.    Baseline PROM L hip extension - 8 degrees, hip flexion 50 degrees, hip abduction 7 degrees (03/13/2020)    Time 12    Period Weeks    Status New    Target Date 06/05/20      PT LONG TERM GOAL #2   Title Patient will have at least 4/5 L hip abduction, flexion, and extension strength to promote ability to ambulate with less difficulty.    Baseline L hip strength not yet tested secondary to being only 3 days post op (03/13/2020)    Time 12    Period Weeks    Status New    Target Date 06/05/20      PT LONG TERM GOAL #3   Title Patient will be able to ambulate at least 500 ft without AD to promote mobility.    Baseline Pt currently ambulates with rw (03/13/2020)    Time 12    Period Weeks    Status New    Target Date 06/05/20      PT LONG TERM GOAL #4   Title Patient will improve his FOTO score by at least 10 points as a demonstration of improved function.    Baseline Hip FOTO 30 (03/13/2020)    Time 12    Period Weeks    Status New    Target Date 06/05/20                 Plan - 03/20/20 1101    Clinical Impression Statement Continued working on hip PROM within precautions as well as gentle hip muscle activation and weight bearing to promote ability to stand and ambulate with less difficulty. Pt tolerated session well without aggravation of symptoms. Pt will benefit from continued skilled physical therapy services to improve strength, ROM, and function.    Personal Factors and Comorbidities Past/Current Experience;Comorbidity 1    Comorbidities DM    Examination-Activity Limitations Transfers;Bed Mobility;Squat;Stairs    Stability/Clinical Decision Making Stable/Uncomplicated    Rehab  Potential Good    PT Frequency 2x / week    PT Duration 12 weeks    PT Treatment/Interventions Therapeutic activities;Functional mobility training;Therapeutic exercise;Balance  training;Neuromuscular re-education;Patient/family education;Manual techniques;Dry needling;Passive range of motion;Aquatic Therapy;Electrical Stimulation;Iontophoresis 4mg /ml Dexamethasone;Gait training;Stair training    PT Next Visit Plan Maintain precautions. PROM. A/AROM, functional strengthening, manual techniques, modalities PRN    PT Home Exercise Plan Medbridge Access Code [email or web address]    Consulted and Agree with Plan of Care Patient           Patient will benefit from skilled therapeutic intervention in order to improve the following deficits and impairments:  Pain,Postural dysfunction,Improper body mechanics,Abnormal gait,Decreased range of motion,Decreased strength,Difficulty walking,Increased fascial restricitons  Visit Diagnosis: Pain in left hip  Muscle weakness (generalized)  Difficulty in walking, not elsewhere classified     Problem List Patient Active Problem List   Diagnosis Date Noted  . Post-traumatic osteoarthritis of left hip 06/26/2015  . Personal history of adenomatous colonic polyps 11/24/2011  . Controlled type 2 diabetes mellitus without complication, without long-term current use of insulin ([place]) 11/15/2011  . Hyperlipidemia LDL goal <70 11/15/2011  . Smokeless tobacco use 10/11/2011  . Obesity 10/11/2011    Joneen Boers PT, DPT  03/20/2020, 12:05 PM  [place] [facility] [address] [place], [address] Phone: [phone]   Fax:  [phone]  Name: Micheal Clark MRN: [id number] Date of Birth: 01-21-1956

## 2020-03-22 ENCOUNTER — Other Ambulatory Visit: Payer: Self-pay | Admitting: Family Medicine

## 2020-03-24 ENCOUNTER — Telehealth: Payer: Self-pay | Admitting: Family Medicine

## 2020-03-24 ENCOUNTER — Ambulatory Visit: Payer: 59

## 2020-03-24 ENCOUNTER — Other Ambulatory Visit: Payer: Self-pay

## 2020-03-24 DIAGNOSIS — R262 Difficulty in walking, not elsewhere classified: Secondary | ICD-10-CM

## 2020-03-24 DIAGNOSIS — M6281 Muscle weakness (generalized): Secondary | ICD-10-CM

## 2020-03-24 DIAGNOSIS — M25552 Pain in left hip: Secondary | ICD-10-CM | POA: Diagnosis not present

## 2020-03-24 NOTE — Telephone Encounter (Signed)
I would try 2 tablets of benadryl first 30 minutes before bed.

## 2020-03-24 NOTE — Telephone Encounter (Signed)
Micheal Clark notified as instructed by telephone.  Patient states understanding.

## 2020-03-24 NOTE — Therapy (Signed)
[place] [facility] [address] [place], [address] Phone: [phone]   Fax:  [phone]  Physical Therapy Treatment  Patient Details  Name: Micheal Clark MRN: [id number] Date of Birth: [birth year] Referring Provider (PT): Rema Jasmine, MD   Encounter Date: 03/24/2020   PT End of Session - 03/24/20 1335    Visit Number 2    Number of Visits 25    Date for PT Re-Evaluation 06/05/20    PT Start Time 1336    PT Stop Time 1408    PT Time Calculation (min) 32 min    Activity Tolerance Patient tolerated treatment well    Behavior During Therapy [facility] for tasks assessed/performed           Past Medical History:  Diagnosis Date  . Diabetes mellitus without complication ([place])   . Diabetes type 2, uncontrolled ([place]) 11/15/2011  . Hyperlipidemia 11/15/2011  . Personal history of adenomatous colonic polyps 11/24/2011  . Smokeless tobacco use 10/11/2011    Past Surgical History:  Procedure Laterality Date  . ORIF ACETABULAR FRACTURE Left 1998   left  . WISDOM TOOTH EXTRACTION      There were no vitals filed for this visit.   Subjective Assessment - 03/24/20 1337    Subjective L hip is not too bad. Not really having pain or discomfort. Was able to sleep without pain 2-3 nights ago.    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 Had a [place] 2000. Had L hip reconstruction. L hip got to bone on bone and surgery was the only option. Used a crutch R side secondary to L hip pain prior to surgery. No falls within the last 6 months. Fear of falling limites activities, but does not prevent pt from leaving his home. Pt lives in a 1 story home with wife. 4 steps to enter front door with B rail, 5 steps back door, no rails.    Patient Stated Goals Walk normally.    Currently in Pain? No/denies    Pain Score 0-No pain                                     PT Education - 03/24/20  1340    Education Details Ther-ex    Person(s) Educated Patient    Methods Explanation;Demonstration;Tactile cues;Verbal cues    Comprehension Returned demonstration;Verbalized understanding          Objective   Antalgic, decrease stance L LE with RW  No BP problems per pt.  No latex allergies reviewed hip precautions with pt.    DOS: 03/10/2020  S/P 2 weeks   MedbridgeAccess Code [email or web address]   Therapeutic exercise  Posterior hip precautions maintained:  Seated L hip flexion to 70 to 80 degrees only to maintain precautions. 10x3  Seated hip extension isometrics 10x5 seconds for 3 sets  Seated L heel digs (isometric hamstrings activation) 10x3 with 5 second holds  Sit<>supine transfermin A with PTassistfor surgical side1x  Supine L hip abduction PROMwith PT10x3  Supine L hip flexion PROM with PT 10x3 (within posterior hip precautions)   Ankle pumps supine 10x  Supine quad set with glute max squeeze 10x3 with 5 second holds  Standing heel toe raises with B UE assist  10x3   Side stepping with B UE assist 5 ft to the L and 5 ft to the R (  hip precautions maintained) 2x. Slight discomfort, eases with rest.   Standing L gastroc stretch with B UE assist 30 seconds x 3  Standing L LE weight shifting with B UE assist 10x2 with 5 second holds    Improved exercise technique, movement at target joints, use of target muscles after min to mod verbal, visual, tactile cues.    Response to treatment Pt tolerated session well without aggravation of symptoms.   Clinical impression Continued working on gentle glute, quad and hamstrings strengthening as well as L LE weight bearing to promote ability to ambulate with less difficulty. Better able to perform sit <> supine transfers with less difficulty observed. Pt tolerated session well without aggravation of symptoms. Pt will benefit from continued skilled physical therapy services to, improve  strength and function.     PT Short Term Goals - 03/13/20 1754      PT SHORT TERM GOAL #1   Title Pt will be independent with his initial HEP to improve ROM, strength, and ability to ambulate with less diffiulty.    Baseline Pt has started his HEP (03/13/2020)    Time 3    Period Weeks    Status New    Target Date 04/03/20             PT Long Term Goals - 03/13/20 1755      PT LONG TERM GOAL #1   Title Patient will improve his L hip extension PROM to 0 degrees, flexion to 90 degrees, and abduction to 30 degrees to promote ability to perform standing tasks with less difficulty.    Baseline PROM L hip extension - 8 degrees, hip flexion 50 degrees, hip abduction 7 degrees (03/13/2020)    Time 12    Period Weeks    Status New    Target Date 06/05/20      PT LONG TERM GOAL #2   Title Patient will have at least 4/5 L hip abduction, flexion, and extension strength to promote ability to ambulate with less difficulty.    Baseline L hip strength not yet tested secondary to being only 3 days post op (03/13/2020)    Time 12    Period Weeks    Status New    Target Date 06/05/20      PT LONG TERM GOAL #3   Title Patient will be able to ambulate at least 500 ft without AD to promote mobility.    Baseline Pt currently ambulates with rw (03/13/2020)    Time 12    Period Weeks    Status New    Target Date 06/05/20      PT LONG TERM GOAL #4   Title Patient will improve his FOTO score by at least 10 points as a demonstration of improved function.    Baseline Hip FOTO 30 (03/13/2020)    Time 12    Period Weeks    Status New    Target Date 06/05/20                 Plan - 03/24/20 1335    Clinical Impression Statement Continued working on gentle glute, quad and hamstrings strengthening as well as L LE weight bearing to promote ability to ambulate with less difficulty. Better able to perform sit <> supine transfers with less difficulty observed. Pt tolerated session well without  aggravation of symptoms. Pt will benefit from continued skilled physical therapy services to, improve strength and function.    Personal Factors and Comorbidities Past/Current Experience;Comorbidity  1    Comorbidities DM    Examination-Activity Limitations Transfers;Bed Mobility;Squat;Stairs    Stability/Clinical Decision Making Stable/Uncomplicated    Rehab Potential Good    PT Frequency 2x / week    PT Duration 12 weeks    PT Treatment/Interventions Therapeutic activities;Functional mobility training;Therapeutic exercise;Balance training;Neuromuscular re-education;Patient/family education;Manual techniques;Dry needling;Passive range of motion;Aquatic Therapy;Electrical Stimulation;Iontophoresis 4mg /ml Dexamethasone;Gait training;Stair training    PT Next Visit Plan Maintain precautions. PROM. A/AROM, functional strengthening, manual techniques, modalities PRN    PT Home Exercise Plan Medbridge Access Code [email or web address]    Consulted and Agree with Plan of Care Patient           Patient will benefit from skilled therapeutic intervention in order to improve the following deficits and impairments:  Pain,Postural dysfunction,Improper body mechanics,Abnormal gait,Decreased range of motion,Decreased strength,Difficulty walking,Increased fascial restricitons  Visit Diagnosis: Pain in left hip  Muscle weakness (generalized)  Difficulty in walking, not elsewhere classified     Problem List Patient Active Problem List   Diagnosis Date Noted  . Post-traumatic osteoarthritis of left hip 06/26/2015  . Personal history of adenomatous colonic polyps 11/24/2011  . Controlled type 2 diabetes mellitus without complication, without long-term current use of insulin ([place]) 11/15/2011  . Hyperlipidemia LDL goal <70 11/15/2011  . Smokeless tobacco use 10/11/2011  . Obesity 10/11/2011    Joneen Boers PT, DPT   03/24/2020, 2:20 PM  [place] [facility] [address], [place], [address] Phone: [phone]   Fax:  [phone]  Name: Micheal Clark MRN: [id number] Date of Birth: 11-05-1955

## 2020-03-24 NOTE — Telephone Encounter (Signed)
Patient just had hip replacement and can't sleep wanted to know if there could be something given to him to sleep . Please advise patient EM

## 2020-03-26 ENCOUNTER — Ambulatory Visit: Payer: 59 | Attending: Physician Assistant

## 2020-03-26 ENCOUNTER — Other Ambulatory Visit: Payer: Self-pay

## 2020-03-26 DIAGNOSIS — M6281 Muscle weakness (generalized): Secondary | ICD-10-CM

## 2020-03-26 DIAGNOSIS — R262 Difficulty in walking, not elsewhere classified: Secondary | ICD-10-CM | POA: Diagnosis present

## 2020-03-26 DIAGNOSIS — M25552 Pain in left hip: Secondary | ICD-10-CM | POA: Diagnosis not present

## 2020-03-26 NOTE — Therapy (Signed)
[place] [facility] [address], [place], [address] Phone: [phone]   Fax:  [phone]  Physical Therapy Treatment  Patient Details  Name: Micheal Clark MRN: [id number] Date of Birth: 1955-05-20 Referring Provider (PT): Rema Jasmine, MD   Encounter Date: 03/26/2020   PT End of Session - 03/26/20 1114    Visit Number 4    Number of Visits 25    Date for PT Re-Evaluation 06/05/20    Authorization Type 4    Authorization Time Period of 10 progress report    PT Start Time 1114    PT Stop Time 1156    PT Time Calculation (min) 42 min    Activity Tolerance Patient tolerated treatment well    Behavior During Therapy [facility] for tasks assessed/performed           Past Medical History:  Diagnosis Date  . Diabetes mellitus without complication ([place])   . Diabetes type 2, uncontrolled ([place]) 11/15/2011  . Hyperlipidemia 11/15/2011  . Personal history of adenomatous colonic polyps 11/24/2011  . Smokeless tobacco use 10/11/2011    Past Surgical History:  Procedure Laterality Date  . ORIF ACETABULAR FRACTURE Left 1998   left  . WISDOM TOOTH EXTRACTION      There were no vitals filed for this visit.   Subjective Assessment - 03/26/20 1116    Subjective L hip is not too bad. No pain currently.    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 Had a [place] 2000. Had L hip reconstruction. L hip got to bone on bone and surgery was the only option. Used a crutch R side secondary to L hip pain prior to surgery. No falls within the last 6 months. Fear of falling limites activities, but does not prevent pt from leaving his home. Pt lives in a 1 story home with wife. 4 steps to enter front door with B rail, 5 steps back door, no rails.    Patient Stated Goals Walk normally.    Currently in Pain? No/denies    Pain Score 0-No pain                                     PT Education -  03/26/20 1124    Education Details ther-ex    Person(s) Educated Patient    Methods Explanation;Demonstration;Tactile cues;Verbal cues    Comprehension Returned demonstration;Verbalized understanding          Objective   Antalgic, decrease stance L LE with RW  No BP problems per pt.  No latex allergies reviewed hip precautions with pt.   DOS: 03/10/2020  S/P 2 weeks   MedbridgeAccess Code [email or web address]   Therapeutic exercise  Posterior hip precautions maintained:  Sit<>supine transferMod I   Supine L hip abduction PROMwith PT10x3  Supine L hip flexion PROM with PT 10x3(within posterior hip precautions)  Ankle pumps supine 10x3  Supine quad set with glute max squeeze 10x3 with 5 second holds  Hooklying transversus abdominis contraction 10x5 seconds for 2 sets  hooklying hip extension isometrics  L 10x5 seconds for 3 sets  LAQ L 10x10 seconds for 2 sets)  Seated L heel digs (isometric hamstrings activation) 10x3 with 5 second holds  Sit <> stand from elevated mat table (height: 24 inches) 10x2 while maintaining posterior hip precautions.    Standing heel toe raises with B UE  assist 10x3    Standing L gastroc stretch with B UE assist 30 seconds x 3  Standing L LE weight shifting with B UE assist 10x2 with 5 second holds    Improved exercise technique, movement at target joints, use of target muscles after min to mod verbal, visual, tactile cues.    Response to treatment Pt tolerated session well without aggravation of symptoms.   Clinical impression Able to perform sit <> supine transfer mod I today without PT assist. Continued working on strengthening and ROM within precautions to promote ability to ambulate, perform transfers and standing tasks with less difficulty. Pt will benefit from continued skilled physical therapy services to address the aforementioned deficits.         PT Short Term Goals - 03/13/20 1754       PT SHORT TERM GOAL #1   Title Pt will be independent with his initial HEP to improve ROM, strength, and ability to ambulate with less diffiulty.    Baseline Pt has started his HEP (03/13/2020)    Time 3    Period Weeks    Status New    Target Date 04/03/20             PT Long Term Goals - 03/13/20 1755      PT LONG TERM GOAL #1   Title Patient will improve his L hip extension PROM to 0 degrees, flexion to 90 degrees, and abduction to 30 degrees to promote ability to perform standing tasks with less difficulty.    Baseline PROM L hip extension - 8 degrees, hip flexion 50 degrees, hip abduction 7 degrees (03/13/2020)    Time 12    Period Weeks    Status New    Target Date 06/05/20      PT LONG TERM GOAL #2   Title Patient will have at least 4/5 L hip abduction, flexion, and extension strength to promote ability to ambulate with less difficulty.    Baseline L hip strength not yet tested secondary to being only 3 days post op (03/13/2020)    Time 12    Period Weeks    Status New    Target Date 06/05/20      PT LONG TERM GOAL #3   Title Patient will be able to ambulate at least 500 ft without AD to promote mobility.    Baseline Pt currently ambulates with rw (03/13/2020)    Time 12    Period Weeks    Status New    Target Date 06/05/20      PT LONG TERM GOAL #4   Title Patient will improve his FOTO score by at least 10 points as a demonstration of improved function.    Baseline Hip FOTO 30 (03/13/2020)    Time 12    Period Weeks    Status New    Target Date 06/05/20                 Plan - 03/26/20 1114    Clinical Impression Statement Able to perform sit <> supine transfer mod I today without PT assist. Continued working on strengthening and ROM within precautions to promote ability to ambulate, perform transfers and standing tasks with less difficulty. Pt will benefit from continued skilled physical therapy services to address the aforementioned deficits.    Personal  Factors and Comorbidities Past/Current Experience;Comorbidity 1    Comorbidities DM    Examination-Activity Limitations Transfers;Bed Mobility;Squat;Stairs    Stability/Clinical Decision Making Stable/Uncomplicated  Rehab Potential Good    PT Frequency 2x / week    PT Duration 12 weeks    PT Treatment/Interventions Therapeutic activities;Functional mobility training;Therapeutic exercise;Balance training;Neuromuscular re-education;Patient/family education;Manual techniques;Dry needling;Passive range of motion;Aquatic Therapy;Electrical Stimulation;Iontophoresis 4mg /ml Dexamethasone;Gait training;Stair training    PT Next Visit Plan Maintain precautions. PROM. A/AROM, functional strengthening, manual techniques, modalities PRN    PT Home Exercise Plan Medbridge Access Code [email or web address]    Consulted and Agree with Plan of Care Patient           Patient will benefit from skilled therapeutic intervention in order to improve the following deficits and impairments:  Pain,Postural dysfunction,Improper body mechanics,Abnormal gait,Decreased range of motion,Decreased strength,Difficulty walking,Increased fascial restricitons  Visit Diagnosis: Pain in left hip  Difficulty in walking, not elsewhere classified  Muscle weakness (generalized)     Problem List Patient Active Problem List   Diagnosis Date Noted  . Post-traumatic osteoarthritis of left hip 06/26/2015  . Personal history of adenomatous colonic polyps 11/24/2011  . Controlled type 2 diabetes mellitus without complication, without long-term current use of insulin ([place]) 11/15/2011  . Hyperlipidemia LDL goal <70 11/15/2011  . Smokeless tobacco use 10/11/2011  . Obesity 10/11/2011    Joneen Boers PT, DPT   03/26/2020, 12:07 PM  [place] [facility] [address] [place], [address] Phone: [phone]   Fax:  [phone]  Name: TREA LATNER MRN: [id number] Date  of Birth: [birth year]

## 2020-03-28 ENCOUNTER — Other Ambulatory Visit: Payer: Self-pay | Admitting: Family Medicine

## 2020-03-31 ENCOUNTER — Ambulatory Visit: Payer: 59

## 2020-03-31 ENCOUNTER — Other Ambulatory Visit: Payer: Self-pay

## 2020-03-31 DIAGNOSIS — R262 Difficulty in walking, not elsewhere classified: Secondary | ICD-10-CM

## 2020-03-31 DIAGNOSIS — M25552 Pain in left hip: Secondary | ICD-10-CM

## 2020-03-31 DIAGNOSIS — M6281 Muscle weakness (generalized): Secondary | ICD-10-CM

## 2020-03-31 NOTE — Telephone Encounter (Signed)
Left voice message to call the office  

## 2020-03-31 NOTE — Telephone Encounter (Signed)
Please schedule CPE with fasting labs with Dr. Copland. 

## 2020-03-31 NOTE — Therapy (Signed)
[facility] [address], [place], [address] Phone: [phone]   Fax:  [phone]  Physical Therapy Treatment  Patient Details  Name: Micheal Clark MRN: [id number] Date of Birth: 1955/10/23 Referring Provider (PT): Rema Jasmine, MD   Encounter Date: 03/31/2020   PT End of Session - 03/31/20 1724    Visit Number 5    Number of Visits 25    Date for PT Re-Evaluation 06/05/20    Authorization Type 5    Authorization Time Period of 10 progress report    PT Start Time 1630    PT Stop Time 1720    PT Time Calculation (min) 50 min    Activity Tolerance Patient tolerated treatment well    Behavior During Therapy [facility] for tasks assessed/performed           Past Medical History:  Diagnosis Date  . Diabetes mellitus without complication ([place])   . Diabetes type 2, uncontrolled ([place]) 11/15/2011  . Hyperlipidemia 11/15/2011  . Personal history of adenomatous colonic polyps 11/24/2011  . Smokeless tobacco use 10/11/2011    Past Surgical History:  Procedure Laterality Date  . ORIF ACETABULAR FRACTURE Left 1998   left  . WISDOM TOOTH EXTRACTION      There were no vitals filed for this visit.   Subjective Assessment - 03/31/20 1633    Subjective Patient reported that he was alright after PT. stated he was a little sore after.    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 Had a [place] 2000. Had L hip reconstruction. L hip got to bone on bone and surgery was the only option. Used a crutch R side secondary to L hip pain prior to surgery. No falls within the last 6 months. Fear of falling limites activities, but does not prevent pt from leaving his home. Pt lives in a 1 story home with wife. 4 steps to enter front door with B rail, 5 steps back door, no rails.    Patient Stated Goals Walk normally.    Currently in Pain? --   2-3/10 back pain, nagging, thinks he slept on it wrong           Objective    Antalgic, decrease stance L LE with RW   No BP problems per pt.    No latex allergies   reviewed hip precautions with pt.    DOS: 03/10/2020   S/P 2 weeks   Medbridge Access Code [email or web address]      Therapeutic exercise   Posterior hip precautions maintained:   Sit <> supine transfer Mod I    Supine L hip abduction AROM with PT 10x3   Supine L hip flexion AAROM with PT 10x3 (within posterior hip precautions)    Bridges 3x10 with cueing to improve LLE weight bearing   Heel slides AROM 3x10 on LLE  Seated hamstring curls with RTB 3x10  Supine quad set with glute max squeeze 10x3 with 5 second holds   Hooklying transversus abdominis contraction 10x5 seconds  LAQ L and R  10x10 seconds for 2 sets with 2# AW  Sit <> stand from elevated mat table (height: 23 inches) 10x2 while maintaining posterior hip precautions.   L heel slightly posterior to R to maximize LLE participation   Standing heel toe raises with B UE assist  10x3    Standing L and R gastroc stretch off step  with B UE assist 30 seconds x  3   Standing hip abduction with BUE support 2x10  Standing hip extension with BUE support 2x10  Gait ~130ft with RW, cues for no UE weight bearing, heel strike, stance time on LLE as well as step length. Pt able to decrease antalgic gait considerably, but still mildly present. 2 bouts of ambulation   Improved exercise technique, movement at target joints, use of target muscles after min to mod verbal, visual, tactile cues.    Pt response/clinical impression: Pt tolerated session well without aggravation of symptoms. Much improved muscle activation noted especially with hip abduction noted, able to complete without physical assist. Pt remains highly motivated to return to PLOF. Trial SPC next session as able if appropriate.        PT Education - 03/31/20 1634    Education Details therex    Person(s) Educated Patient    Methods  Explanation;Demonstration;Tactile cues    Comprehension Verbalized understanding;Returned demonstration;Verbal cues required            PT Short Term Goals - 03/13/20 1754      PT SHORT TERM GOAL #1   Title Pt will be independent with his initial HEP to improve ROM, strength, and ability to ambulate with less diffiulty.    Baseline Pt has started his HEP (03/13/2020)    Time 3    Period Weeks    Status New    Target Date 04/03/20             PT Long Term Goals - 03/13/20 1755      PT LONG TERM GOAL #1   Title Patient will improve his L hip extension PROM to 0 degrees, flexion to 90 degrees, and abduction to 30 degrees to promote ability to perform standing tasks with less difficulty.    Baseline PROM L hip extension - 8 degrees, hip flexion 50 degrees, hip abduction 7 degrees (03/13/2020)    Time 12    Period Weeks    Status New    Target Date 06/05/20      PT LONG TERM GOAL #2   Title Patient will have at least 4/5 L hip abduction, flexion, and extension strength to promote ability to ambulate with less difficulty.    Baseline L hip strength not yet tested secondary to being only 3 days post op (03/13/2020)    Time 12    Period Weeks    Status New    Target Date 06/05/20      PT LONG TERM GOAL #3   Title Patient will be able to ambulate at least 500 ft without AD to promote mobility.    Baseline Pt currently ambulates with rw (03/13/2020)    Time 12    Period Weeks    Status New    Target Date 06/05/20      PT LONG TERM GOAL #4   Title Patient will improve his FOTO score by at least 10 points as a demonstration of improved function.    Baseline Hip FOTO 30 (03/13/2020)    Time 12    Period Weeks    Status New    Target Date 06/05/20                 Plan - 03/31/20 1659    Clinical Impression Statement Pt tolerated session well without aggravation of symptoms. Much improved muscle activation noted especially with hip abduction noted, able to complete  without physical assist. Pt remains highly motivated to return to PLOF. Trial SPC next  session as able if appropriate.    Personal Factors and Comorbidities Past/Current Experience;Comorbidity 1    Comorbidities DM    Examination-Activity Limitations Transfers;Bed Mobility;Squat;Stairs    Stability/Clinical Decision Making Stable/Uncomplicated    Rehab Potential Good    PT Frequency 2x / week    PT Duration 12 weeks    PT Treatment/Interventions Therapeutic activities;Functional mobility training;Therapeutic exercise;Balance training;Neuromuscular re-education;Patient/family education;Manual techniques;Dry needling;Passive range of motion;Aquatic Therapy;Electrical Stimulation;Iontophoresis 4mg /ml Dexamethasone;Gait training;Stair training    PT Next Visit Plan Maintain precautions. PROM. A/AROM, functional strengthening, manual techniques, modalities PRN    PT Home Exercise Plan Medbridge Access Code [email or web address]    Consulted and Agree with Plan of Care Patient           Patient will benefit from skilled therapeutic intervention in order to improve the following deficits and impairments:  Pain,Postural dysfunction,Improper body mechanics,Abnormal gait,Decreased range of motion,Decreased strength,Difficulty walking,Increased fascial restricitons  Visit Diagnosis: Pain in left hip  Difficulty in walking, not elsewhere classified  Muscle weakness (generalized)     Problem List Patient Active Problem List   Diagnosis Date Noted  . Post-traumatic osteoarthritis of left hip 06/26/2015  . Personal history of adenomatous colonic polyps 11/24/2011  . Controlled type 2 diabetes mellitus without complication, without long-term current use of insulin ([place]) 11/15/2011  . Hyperlipidemia LDL goal <70 11/15/2011  . Smokeless tobacco use 10/11/2011  . Obesity 10/11/2011    Lieutenant Diego PT, DPT 5:26 PM,03/31/20   [facility] [address] [place], [address] Phone: [phone]   Fax:  [phone]  Name: Micheal Clark MRN: [id number] Date of Birth: 1955-07-05

## 2020-04-02 ENCOUNTER — Other Ambulatory Visit: Payer: Self-pay

## 2020-04-02 ENCOUNTER — Ambulatory Visit: Payer: 59

## 2020-04-02 DIAGNOSIS — R262 Difficulty in walking, not elsewhere classified: Secondary | ICD-10-CM

## 2020-04-02 DIAGNOSIS — M25552 Pain in left hip: Secondary | ICD-10-CM | POA: Diagnosis not present

## 2020-04-02 DIAGNOSIS — M6281 Muscle weakness (generalized): Secondary | ICD-10-CM

## 2020-04-02 NOTE — Therapy (Signed)
[facility] [address], [place], [address] Phone: [phone]   Fax:  [phone]  Physical Therapy Treatment  Patient Details  Name: Micheal Clark MRN: [id number] Date of Birth: 1955/03/03 Referring Provider (PT): Rema Jasmine, MD   Encounter Date: 04/02/2020    PT End of Session - 04/02/20 1328    Visit Number 6    Number of Visits 25    Date for PT Re-Evaluation 06/05/20    Authorization Type 6    Authorization Time Period of 10 progress report    PT Start Time 1329    PT Stop Time 1410    PT Time Calculation (min) 41 min    Activity Tolerance Patient tolerated treatment well    Behavior During Therapy [facility] for tasks assessed/performed           Past Medical History:  Diagnosis Date  . Diabetes mellitus without complication ([place])   . Diabetes type 2, uncontrolled ([place]) 11/15/2011  . Hyperlipidemia 11/15/2011  . Personal history of adenomatous colonic polyps 11/24/2011  . Smokeless tobacco use 10/11/2011    Past Surgical History:  Procedure Laterality Date  . ORIF ACETABULAR FRACTURE Left 1998   left  . WISDOM TOOTH EXTRACTION      There were no vitals filed for this visit.   Subjective Assessment - 04/02/20 1330    Subjective L hip is feeling pretty good. No pain.    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 Had a [place] 2000. Had L hip reconstruction. L hip got to bone on bone and surgery was the only option. Used a crutch R side secondary to L hip pain prior to surgery. No falls within the last 6 months. Fear of falling limites activities, but does not prevent pt from leaving his home. Pt lives in a 1 story home with wife. 4 steps to enter front door with B rail, 5 steps back door, no rails.    Patient Stated Goals Walk normally.    Currently in Pain? No/denies                                     PT Education - 04/02/20 1333     Education Details ther-ex    Person(s) Educated Patient    Methods Explanation;Demonstration;Tactile cues;Verbal cues    Comprehension Verbalized understanding;Returned demonstration          Objective    Antalgic, decrease stance L LE with RW   No BP problems per pt.    No latex allergies   reviewed hip precautions with pt.    DOS: 03/10/2020   S/P 3 weeks   Medbridge Access Code [email or web address]      Therapeutic exercise   Posterior hip precautions maintained:   Sit <> supine transfer Mod I    Supine L hip abduction AROM, no assist 10x3   Supine L hip flexion AAROM with PT 10x3 (within posterior hip precautions)    Supine quad set with glute max squeeze 10x3 with 5 second holds   Hooklying transversus abdominis contraction 10x10 seconds  Bridges 3x10 with cueing to improve LLE weight bearing   Seated hamstring curls with RTB 3x10  LAQ L  10x, then 10x10 seconds for 2 sets with 2 lbs ankle weight  Easy. Increase weight next visit.   Sit <> stand from elevated mat table (height:  23 inches) 10x2 while maintaining posterior hip precautions.   L heel slightly posterior to R to maximize LLE participation   No UE assist.   Gait x 100 ft with R UE assist from rw only to promote gait with SPC transition when appropriate  Gait with SPC on R side, 100 ft, CGA to SBA towards last 10 ft. No LOB.    Standing heel toe raises with B UE assist  10x3     Standing hip abduction with BUE support 2x10 L LE  Standing hip extension with BUE support 2x10 L LE     Improved exercise technique, movement at target joints, use of target muscles after min to mod verbal, visual, tactile cues.    Pt response/clinical impression:  Improving L hip strength observed. Able to ambulate with SPC on R without LOB. Some unsteadiness with L LE stance phase.  Continued working in improving L glute, quad, and hamstrings strength to promote L LE single leg stance strength to  promote ability to ambulate with less difficulty. Pt tolerated session well without aggravation of symptoms. Pt will benefit from continued skilled physical therapy services to improve strength, balance, and function.         PT Short Term Goals - 03/13/20 1754      PT SHORT TERM GOAL #1   Title Pt will be independent with his initial HEP to improve ROM, strength, and ability to ambulate with less diffiulty.    Baseline Pt has started his HEP (03/13/2020)    Time 3    Period Weeks    Status New    Target Date 04/03/20             PT Long Term Goals - 03/13/20 1755      PT LONG TERM GOAL #1   Title Patient will improve his L hip extension PROM to 0 degrees, flexion to 90 degrees, and abduction to 30 degrees to promote ability to perform standing tasks with less difficulty.    Baseline PROM L hip extension - 8 degrees, hip flexion 50 degrees, hip abduction 7 degrees (03/13/2020)    Time 12    Period Weeks    Status New    Target Date 06/05/20      PT LONG TERM GOAL #2   Title Patient will have at least 4/5 L hip abduction, flexion, and extension strength to promote ability to ambulate with less difficulty.    Baseline L hip strength not yet tested secondary to being only 3 days post op (03/13/2020)    Time 12    Period Weeks    Status New    Target Date 06/05/20      PT LONG TERM GOAL #3   Title Patient will be able to ambulate at least 500 ft without AD to promote mobility.    Baseline Pt currently ambulates with rw (03/13/2020)    Time 12    Period Weeks    Status New    Target Date 06/05/20      PT LONG TERM GOAL #4   Title Patient will improve his FOTO score by at least 10 points as a demonstration of improved function.    Baseline Hip FOTO 30 (03/13/2020)    Time 12    Period Weeks    Status New    Target Date 06/05/20                 Plan - 04/02/20 1328    Clinical Impression  Statement Improving L hip strength observed. Able to ambulate with SPC on R  without LOB. Some unsteadiness with L LE stance phase.  Continued working in improving L glute, quad, and hamstrings strength to promote L LE single leg stance strength to promote ability to ambulate with less difficulty. Pt tolerated session well without aggravation of symptoms. Pt will benefit from continued skilled physical therapy services to improve strength, balance, and function.    Personal Factors and Comorbidities Past/Current Experience;Comorbidity 1    Comorbidities DM    Examination-Activity Limitations Transfers;Bed Mobility;Squat;Stairs    Stability/Clinical Decision Making Stable/Uncomplicated    Rehab Potential Good    PT Frequency 2x / week    PT Duration 12 weeks    PT Treatment/Interventions Therapeutic activities;Functional mobility training;Therapeutic exercise;Balance training;Neuromuscular re-education;Patient/family education;Manual techniques;Dry needling;Passive range of motion;Aquatic Therapy;Electrical Stimulation;Iontophoresis 4mg /ml Dexamethasone;Gait training;Stair training    PT Next Visit Plan Maintain precautions. PROM. A/AROM, functional strengthening, manual techniques, modalities PRN    PT Home Exercise Plan Medbridge Access Code [email or web address]    Consulted and Agree with Plan of Care Patient           Patient will benefit from skilled therapeutic intervention in order to improve the following deficits and impairments:  Pain,Postural dysfunction,Improper body mechanics,Abnormal gait,Decreased range of motion,Decreased strength,Difficulty walking,Increased fascial restricitons  Visit Diagnosis: Pain in left hip  Difficulty in walking, not elsewhere classified  Muscle weakness (generalized)     Problem List Patient Active Problem List   Diagnosis Date Noted  . Post-traumatic osteoarthritis of left hip 06/26/2015  . Personal history of adenomatous colonic polyps 11/24/2011  . Controlled type 2 diabetes mellitus without complication, without long-term  current use of insulin ([place]) 11/15/2011  . Hyperlipidemia LDL goal <70 11/15/2011  . Smokeless tobacco use 10/11/2011  . Obesity 10/11/2011    Joneen Boers PT, DPT   04/02/2020, 5:55 PM  [facility] [address] [place], [address] Phone: [phone]   Fax:  [phone]  Name: ITHAN TOUHEY MRN: [id number] Date of Birth: 04/08/1955

## 2020-04-07 ENCOUNTER — Other Ambulatory Visit: Payer: Self-pay

## 2020-04-07 ENCOUNTER — Ambulatory Visit: Payer: 59

## 2020-04-07 DIAGNOSIS — M6281 Muscle weakness (generalized): Secondary | ICD-10-CM

## 2020-04-07 DIAGNOSIS — M25552 Pain in left hip: Secondary | ICD-10-CM

## 2020-04-07 DIAGNOSIS — R262 Difficulty in walking, not elsewhere classified: Secondary | ICD-10-CM

## 2020-04-07 NOTE — Therapy (Signed)
[facility] [address], [place], [address] Phone: [phone]   Fax:  [phone]  Physical Therapy Treatment  Patient Details  Name: Micheal Clark MRN: [id number] Date of Birth: August 24, 1955 Referring Provider (PT): Rema Jasmine, MD   Encounter Date: 04/07/2020   PT End of Session - 04/07/20 1331    Visit Number 7    Number of Visits 25    Date for PT Re-Evaluation 06/05/20    Authorization Type 7    Authorization Time Period of 10 progress report    PT Start Time 1331    PT Stop Time 1412    PT Time Calculation (min) 41 min    Activity Tolerance Patient tolerated treatment well    Behavior During Therapy [facility] for tasks assessed/performed           Past Medical History:  Diagnosis Date  . Diabetes mellitus without complication ([place])   . Diabetes type 2, uncontrolled ([place]) 11/15/2011  . Hyperlipidemia 11/15/2011  . Personal history of adenomatous colonic polyps 11/24/2011  . Smokeless tobacco use 10/11/2011    Past Surgical History:  Procedure Laterality Date  . ORIF ACETABULAR FRACTURE Left 1998   left  . WISDOM TOOTH EXTRACTION      There were no vitals filed for this visit.   Subjective Assessment - 04/07/20 1332    Subjective L hip is doing pretty good. No pain or discomfort.    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 Had a [place] 2000. Had L hip reconstruction. L hip got to bone on bone and surgery was the only option. Used a crutch R side secondary to L hip pain prior to surgery. No falls within the last 6 months. Fear of falling limites activities, but does not prevent pt from leaving his home. Pt lives in a 1 story home with wife. 4 steps to enter front door with B rail, 5 steps back door, no rails.    Patient Stated Goals Walk normally.    Currently in Pain? No/denies                                     PT Education - 04/07/20 1344     Education Details ther-ex    Person(s) Educated Patient    Methods Explanation;Demonstration;Tactile cues;Verbal cues    Comprehension Returned demonstration;Verbalized understanding          Objective   Antalgic, decrease stance L LE with RW   No BP problems per pt.   No latex allergies   reviewed hip precautions with pt.   DOS: 03/10/2020   S/P 4 weeks   Medbridge Access Code [email or web address]     Therapeutic exercise    Posterior hip precautions maintained:   Gait with SPC 500 ft, SBA, no LOB.   Side stepping with SPC, 32 ft to the R and 32 ft to the L for 2 sets  Sit <> stand from elevated mat table (height: 23inches) 10x2 while maintaining posterior hip precautions.  Forward step up onto 4 inch step with R UE assist 10x2  Standing heel toe raises with B UE assist 10x3   Standing hip abduction with BUE support 3x10 L LE  Standing hip extension with BUE support 3x10 L LE  standing marches no greater than 90 degrees hip flexion to maintain precautions, B UE assist  Sit <>supine transfer Mod I   Bridges 3x10 with cueing to improve LLE weight bearing   Supine quad set with glute max squeeze 10x3 with 5 second holds   LAQ L10x5 seconds with 3 lbs ankle weight                   Improved exercise technique, movement at target joints, use of target muscles after min to mod verbal, visual, tactile cues.  Pt response/clinical impression: Able to ambulate at least 500 ft with SPC, no LOB. Pt making very good progress with functional strength and ability to ambulate. Continued working on hip and general L LE strengthening to promote ability to perform standing tasks with less difficulty. Pt will benefit from continued skilled physical therapy services to improve strength, ROM, function and ability to ambulate without AD.          PT Short Term Goals - 03/13/20 1754      PT SHORT TERM GOAL #1   Title Pt will be independent with  his initial HEP to improve ROM, strength, and ability to ambulate with less diffiulty.    Baseline Pt has started his HEP (03/13/2020)    Time 3    Period Weeks    Status New    Target Date 04/03/20             PT Long Term Goals - 03/13/20 1755      PT LONG TERM GOAL #1   Title Patient will improve his L hip extension PROM to 0 degrees, flexion to 90 degrees, and abduction to 30 degrees to promote ability to perform standing tasks with less difficulty.    Baseline PROM L hip extension - 8 degrees, hip flexion 50 degrees, hip abduction 7 degrees (03/13/2020)    Time 12    Period Weeks    Status New    Target Date 06/05/20      PT LONG TERM GOAL #2   Title Patient will have at least 4/5 L hip abduction, flexion, and extension strength to promote ability to ambulate with less difficulty.    Baseline L hip strength not yet tested secondary to being only 3 days post op (03/13/2020)    Time 12    Period Weeks    Status New    Target Date 06/05/20      PT LONG TERM GOAL #3   Title Patient will be able to ambulate at least 500 ft without AD to promote mobility.    Baseline Pt currently ambulates with rw (03/13/2020)    Time 12    Period Weeks    Status New    Target Date 06/05/20      PT LONG TERM GOAL #4   Title Patient will improve his FOTO score by at least 10 points as a demonstration of improved function.    Baseline Hip FOTO 30 (03/13/2020)    Time 12    Period Weeks    Status New    Target Date 06/05/20                 Plan - 04/07/20 1326    Clinical Impression Statement Able to ambulate at least 500 ft with SPC, no LOB. Pt making very good progress with functional strength and ability to ambulate. Continued working on hip and general L LE strengthening to promote ability to perform standing tasks with less difficulty. Pt will benefit from continued skilled physical therapy services to improve strength, ROM, function  and ability to ambulate without AD.    Personal  Factors and Comorbidities Past/Current Experience;Comorbidity 1    Comorbidities DM    Examination-Activity Limitations Transfers;Bed Mobility;Squat;Stairs    Stability/Clinical Decision Making Stable/Uncomplicated    Rehab Potential Good    PT Frequency 2x / week    PT Duration 12 weeks    PT Treatment/Interventions Therapeutic activities;Functional mobility training;Therapeutic exercise;Balance training;Neuromuscular re-education;Patient/family education;Manual techniques;Dry needling;Passive range of motion;Aquatic Therapy;Electrical Stimulation;Iontophoresis 4mg /ml Dexamethasone;Gait training;Stair training    PT Next Visit Plan Maintain precautions. PROM. A/AROM, functional strengthening, manual techniques, modalities PRN    PT Home Exercise Plan Medbridge Access Code [email or web address]    Consulted and Agree with Plan of Care Patient           Patient will benefit from skilled therapeutic intervention in order to improve the following deficits and impairments:  Pain,Postural dysfunction,Improper body mechanics,Abnormal gait,Decreased range of motion,Decreased strength,Difficulty walking,Increased fascial restricitons  Visit Diagnosis: Pain in left hip  Difficulty in walking, not elsewhere classified  Muscle weakness (generalized)     Problem List Patient Active Problem List   Diagnosis Date Noted  . Post-traumatic osteoarthritis of left hip 06/26/2015  . Personal history of adenomatous colonic polyps 11/24/2011  . Controlled type 2 diabetes mellitus without complication, without long-term current use of insulin ([place]) 11/15/2011  . Hyperlipidemia LDL goal <70 11/15/2011  . Smokeless tobacco use 10/11/2011  . Obesity 10/11/2011    Joneen Boers PT, DPT   04/07/2020, 2:13 PM  [facility] [address] [place], [address] Phone: [phone]   Fax:  [phone]  Name: Micheal Clark MRN: [id number] Date  of Birth: [birth year]

## 2020-04-07 NOTE — Telephone Encounter (Signed)
Left voice message to call the office  

## 2020-04-09 ENCOUNTER — Ambulatory Visit: Payer: 59

## 2020-04-09 ENCOUNTER — Other Ambulatory Visit: Payer: Self-pay

## 2020-04-09 DIAGNOSIS — M25552 Pain in left hip: Secondary | ICD-10-CM

## 2020-04-09 DIAGNOSIS — M6281 Muscle weakness (generalized): Secondary | ICD-10-CM

## 2020-04-09 DIAGNOSIS — R262 Difficulty in walking, not elsewhere classified: Secondary | ICD-10-CM

## 2020-04-09 NOTE — Therapy (Signed)
[facility] [address] [place], [address] Phone: [phone]   Fax:  [phone]  Physical Therapy Treatment  Patient Details  Name: Micheal Clark MRN: [id number] Date of Birth: November 05, 1955 Referring Provider (PT): Rema Jasmine, MD   Encounter Date: 04/09/2020   PT End of Session - 04/09/20 1414    Visit Number 8    Number of Visits 25    Date for PT Re-Evaluation 06/05/20    Authorization Type 8    Authorization Time Period of 10 progress report    PT Start Time 1414    PT Stop Time 1454    PT Time Calculation (min) 40 min    Activity Tolerance Patient tolerated treatment well    Behavior During Therapy [facility] for tasks assessed/performed           Past Medical History:  Diagnosis Date  . Diabetes mellitus without complication ([place])   . Diabetes type 2, uncontrolled ([place]) 11/15/2011  . Hyperlipidemia 11/15/2011  . Personal history of adenomatous colonic polyps 11/24/2011  . Smokeless tobacco use 10/11/2011    Past Surgical History:  Procedure Laterality Date  . ORIF ACETABULAR FRACTURE Left 1998   left  . WISDOM TOOTH EXTRACTION      There were no vitals filed for this visit.   Subjective Assessment - 04/09/20 1416    Subjective L hip is pretty good. Was alright after last session.    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 Had a [place] 2000. Had L hip reconstruction. L hip got to bone on bone and surgery was the only option. Used a crutch R side secondary to L hip pain prior to surgery. No falls within the last 6 months. Fear of falling limites activities, but does not prevent pt from leaving his home. Pt lives in a 1 story home with wife. 4 steps to enter front door with B rail, 5 steps back door, no rails.    Patient Stated Goals Walk normally.    Currently in Pain? No/denies                                     PT Education - 04/09/20  1419    Education Details ther-ex    Person(s) Educated Patient    Methods Explanation;Demonstration;Tactile cues;Verbal cues    Comprehension Returned demonstration;Verbalized understanding           Objective   Antalgic, decrease stance L LE with RW   No BP problems per pt.   No latex allergies   reviewed hip precautions with pt.   DOS: 03/10/2020  S/P4weeks   Medbridge Access Code [email or web address]     Therapeutic exercise    Posterior hip precautions maintained:   Gait with SPC 500 ft, Mod I, no LOB.   Side stepping with SPC, 32 ft to the R and 32 ft to the L for 2 sets   Forward wedding march with SPC 32 ft x 2  Forward step up onto and over 4 inch step with SPC 10x with CGA to SBA  Lateral step up onto 4 inch step with B UE assist 10x2 L LE  Static mini lunge with R UE assist   forward   L 10x3  Lateral    L 10x3 B UE assist to R UE assist   Standing hip abduction with BUE  support 3x10L LE   Standing hip extension with BUE support 3x10L LE  Standing mini squat without UE assist 10x3   Standing heel toe raises with B UE assist 10x3     Improved exercise technique, movement at target joints, use of target muscles after min to mod verbal, visual, tactile cues.  Pt response/clinical impression: Pt now able to ambulate with SPC Mod I. Pt also able to step up onto and off a 4 inch height surface (such as curb) using SPC without LOB. Improving functional strength L LE. Continued working on overall LE strength to decrease difficulty with gait and stairs. Pt will benefit from continued skilled physica therapy services to improve strength, balance, and function       PT Short Term Goals - 03/13/20 1754      PT SHORT TERM GOAL #1   Title Pt will be independent with his initial HEP to improve ROM, strength, and ability to ambulate with less diffiulty.    Baseline Pt has started his HEP (03/13/2020)    Time 3    Period Weeks     Status New    Target Date 04/03/20             PT Long Term Goals - 03/13/20 1755      PT LONG TERM GOAL #1   Title Patient will improve his L hip extension PROM to 0 degrees, flexion to 90 degrees, and abduction to 30 degrees to promote ability to perform standing tasks with less difficulty.    Baseline PROM L hip extension - 8 degrees, hip flexion 50 degrees, hip abduction 7 degrees (03/13/2020)    Time 12    Period Weeks    Status New    Target Date 06/05/20      PT LONG TERM GOAL #2   Title Patient will have at least 4/5 L hip abduction, flexion, and extension strength to promote ability to ambulate with less difficulty.    Baseline L hip strength not yet tested secondary to being only 3 days post op (03/13/2020)    Time 12    Period Weeks    Status New    Target Date 06/05/20      PT LONG TERM GOAL #3   Title Patient will be able to ambulate at least 500 ft without AD to promote mobility.    Baseline Pt currently ambulates with rw (03/13/2020)    Time 12    Period Weeks    Status New    Target Date 06/05/20      PT LONG TERM GOAL #4   Title Patient will improve his FOTO score by at least 10 points as a demonstration of improved function.    Baseline Hip FOTO 30 (03/13/2020)    Time 12    Period Weeks    Status New    Target Date 06/05/20                 Plan - 04/09/20 1414    Clinical Impression Statement Pt now able to ambulate with SPC Mod I. Pt also able to step up onto and off a 4 inch height surface (such as curb) using SPC without LOB. Improving functional strength L LE. Continued working on overall LE strength to decrease difficulty with gait and stairs. Pt will benefit from continued skilled physica therapy services to improve strength, balance, and function    Personal Factors and Comorbidities Past/Current Experience;Comorbidity 1    Comorbidities DM  Examination-Activity Limitations Transfers;Bed Mobility;Squat;Stairs    Stability/Clinical  Decision Making Stable/Uncomplicated    Rehab Potential Good    PT Frequency 2x / week    PT Duration 12 weeks    PT Treatment/Interventions Therapeutic activities;Functional mobility training;Therapeutic exercise;Balance training;Neuromuscular re-education;Patient/family education;Manual techniques;Dry needling;Passive range of motion;Aquatic Therapy;Electrical Stimulation;Iontophoresis 4mg /ml Dexamethasone;Gait training;Stair training    PT Next Visit Plan Maintain precautions. PROM. A/AROM, functional strengthening, manual techniques, modalities PRN    PT Home Exercise Plan Medbridge Access Code [email or web address]    Consulted and Agree with Plan of Care Patient           Patient will benefit from skilled therapeutic intervention in order to improve the following deficits and impairments:  Pain,Postural dysfunction,Improper body mechanics,Abnormal gait,Decreased range of motion,Decreased strength,Difficulty walking,Increased fascial restricitons  Visit Diagnosis: Pain in left hip  Difficulty in walking, not elsewhere classified  Muscle weakness (generalized)     Problem List Patient Active Problem List   Diagnosis Date Noted  . Post-traumatic osteoarthritis of left hip 06/26/2015  . Personal history of adenomatous colonic polyps 11/24/2011  . Controlled type 2 diabetes mellitus without complication, without long-term current use of insulin ([place]) 11/15/2011  . Hyperlipidemia LDL goal <70 11/15/2011  . Smokeless tobacco use 10/11/2011  . Obesity 10/11/2011    Joneen Boers PT, DPT   04/09/2020, 2:55 PM  [facility] [address] [place], [address] Phone: [phone]   Fax:  [phone]  Name: Micheal Clark MRN: [id number] Date of Birth: 06/19/1955

## 2020-04-14 ENCOUNTER — Ambulatory Visit: Payer: 59

## 2020-04-14 ENCOUNTER — Other Ambulatory Visit: Payer: Self-pay

## 2020-04-14 DIAGNOSIS — M25552 Pain in left hip: Secondary | ICD-10-CM

## 2020-04-14 DIAGNOSIS — M6281 Muscle weakness (generalized): Secondary | ICD-10-CM

## 2020-04-14 DIAGNOSIS — R262 Difficulty in walking, not elsewhere classified: Secondary | ICD-10-CM

## 2020-04-14 NOTE — Therapy (Signed)
[facility] [address] [place], [address] Phone: [phone]   Fax:  [phone]  Physical Therapy Treatment  Patient Details  Name: Micheal Clark MRN: [id number] Date of Birth: 02-Aug-1955 Referring Provider (PT): Rema Jasmine, MD   Encounter Date: 04/14/2020   PT End of Session - 04/14/20 1330    Visit Number 9    Number of Visits 25    Date for PT Re-Evaluation 06/05/20    Authorization Type 9    Authorization Time Period of 10 progress report    PT Start Time 1330    PT Stop Time 1410    PT Time Calculation (min) 40 min    Activity Tolerance Patient tolerated treatment well    Behavior During Therapy [facility] for tasks assessed/performed           Past Medical History:  Diagnosis Date  . Diabetes mellitus without complication ([place])   . Diabetes type 2, uncontrolled ([place]) 11/15/2011  . Hyperlipidemia 11/15/2011  . Personal history of adenomatous colonic polyps 11/24/2011  . Smokeless tobacco use 10/11/2011    Past Surgical History:  Procedure Laterality Date  . ORIF ACETABULAR FRACTURE Left 1998   left  . WISDOM TOOTH EXTRACTION      There were no vitals filed for this visit.   Subjective Assessment - 04/14/20 1332    Subjective L hip is ok. Walking with the cane is ok.    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 Had a [place] 2000. Had L hip reconstruction. L hip got to bone on bone and surgery was the only option. Used a crutch R side secondary to L hip pain prior to surgery. No falls within the last 6 months. Fear of falling limites activities, but does not prevent pt from leaving his home. Pt lives in a 1 story home with wife. 4 steps to enter front door with B rail, 5 steps back door, no rails.    Patient Stated Goals Walk normally.    Currently in Pain? No/denies                                     PT Education - 04/14/20 1347     Education Details ther-ex    Person(s) Educated Patient    Methods Explanation;Demonstration;Tactile cues;Verbal cues    Comprehension Returned demonstration;Verbalized understanding              Objective   Antalgic, decrease stance L LE with RW   No BP problems per pt.   No latex allergies   reviewed hip precautions with pt.   DOS: 03/10/2020  S/P5weeks   Medbridge Access Code [email or web address]     Therapeutic exercise    Posterior hip precautions maintained:   Standing hip abduction with BUE support2x10L LE, then 10x5 seconds   Standing hip extension with BUE support2x10L LE, then 10x5 seconds   Static mini lunge with R UE assist              forward                         L 10x3             Lateral  L 10x3 B UE assist to R UE assist   Forward step up onto and over 4 inch step with SPC 10x2 with CGA to SBA  Lateral step up onto 4 inch step with R UE assist 10x3 L LE  Gait without use of SPC (pt carrying SPC) 200 ft. Slight decreased stance L LE and L glute med and max weakness observed.   Side stepping without use of SPC 32 ft to the R and 32 ft to the L 2x.   SLS with R UE assist   L 10x5 seconds for 3 sets  Standing mini squat without UE assist 10x3  Forward wedding march without SPC 30 ft x 4  Standing heel toe raises with B UE assist 10x2    Improved exercise technique, movement at target joints, use of target muscles after min to mod verbal, visual, tactile cues.  Pt response/clinical impression: Able to ambulate without AD with slight antalgic pattern, no pain, with glute med and max weakness observed. Continued working on improving L glute med, max, and thigh strength to promote ability to perform standing tasks as well as ambulate, and negotiate stairs with less difficulty. Pt tolerated session well without aggravation of symptoms. Pt will benefit from continued skilled physical therapy  services to improve strength, balance, and function.         PT Short Term Goals - 03/13/20 1754      PT SHORT TERM GOAL #1   Title Pt will be independent with his initial HEP to improve ROM, strength, and ability to ambulate with less diffiulty.    Baseline Pt has started his HEP (03/13/2020)    Time 3    Period Weeks    Status New    Target Date 04/03/20             PT Long Term Goals - 03/13/20 1755      PT LONG TERM GOAL #1   Title Patient will improve his L hip extension PROM to 0 degrees, flexion to 90 degrees, and abduction to 30 degrees to promote ability to perform standing tasks with less difficulty.    Baseline PROM L hip extension - 8 degrees, hip flexion 50 degrees, hip abduction 7 degrees (03/13/2020)    Time 12    Period Weeks    Status New    Target Date 06/05/20      PT LONG TERM GOAL #2   Title Patient will have at least 4/5 L hip abduction, flexion, and extension strength to promote ability to ambulate with less difficulty.    Baseline L hip strength not yet tested secondary to being only 3 days post op (03/13/2020)    Time 12    Period Weeks    Status New    Target Date 06/05/20      PT LONG TERM GOAL #3   Title Patient will be able to ambulate at least 500 ft without AD to promote mobility.    Baseline Pt currently ambulates with rw (03/13/2020)    Time 12    Period Weeks    Status New    Target Date 06/05/20      PT LONG TERM GOAL #4   Title Patient will improve his FOTO score by at least 10 points as a demonstration of improved function.    Baseline Hip FOTO 30 (03/13/2020)    Time 12    Period Weeks    Status New    Target Date 06/05/20  Plan - 04/14/20 1326    Clinical Impression Statement Able to ambulate without AD with slight antalgic pattern, no pain, with glute med and max weakness observed. Continued working on improving L glute med, max, and thigh strength to promote ability to perform standing tasks as well  as ambulate, and negotiate stairs with less difficulty. Pt tolerated session well without aggravation of symptoms. Pt will benefit from continued skilled physical therapy services to improve strength, balance, and function.    Personal Factors and Comorbidities Past/Current Experience;Comorbidity 1    Comorbidities DM    Examination-Activity Limitations Transfers;Bed Mobility;Squat;Stairs    Stability/Clinical Decision Making Stable/Uncomplicated    Clinical Decision Making Low    Rehab Potential Good    PT Frequency 2x / week    PT Duration 12 weeks    PT Treatment/Interventions Therapeutic activities;Functional mobility training;Therapeutic exercise;Balance training;Neuromuscular re-education;Patient/family education;Manual techniques;Dry needling;Passive range of motion;Aquatic Therapy;Electrical Stimulation;Iontophoresis 4mg /ml Dexamethasone;Gait training;Stair training    PT Next Visit Plan Maintain precautions. PROM. A/AROM, functional strengthening, manual techniques, modalities PRN    PT Home Exercise Plan Medbridge Access Code [email or web address]    Consulted and Agree with Plan of Care Patient           Patient will benefit from skilled therapeutic intervention in order to improve the following deficits and impairments:  Pain,Postural dysfunction,Improper body mechanics,Abnormal gait,Decreased range of motion,Decreased strength,Difficulty walking,Increased fascial restricitons  Visit Diagnosis: Pain in left hip  Difficulty in walking, not elsewhere classified  Muscle weakness (generalized)     Problem List Patient Active Problem List   Diagnosis Date Noted  . Post-traumatic osteoarthritis of left hip 06/26/2015  . Personal history of adenomatous colonic polyps 11/24/2011  . Controlled type 2 diabetes mellitus without complication, without long-term current use of insulin ([place]) 11/15/2011  . Hyperlipidemia LDL goal <70 11/15/2011  . Smokeless tobacco use 10/11/2011  . Obesity  10/11/2011    Joneen Boers PT, DPT   04/14/2020, 4:18 PM  [facility] [address] [place], [address] Phone: [phone]   Fax:  [phone]  Name: Micheal Clark MRN: [id number] Date of Birth: 1955/05/11

## 2020-04-17 ENCOUNTER — Other Ambulatory Visit: Payer: Self-pay

## 2020-04-17 ENCOUNTER — Ambulatory Visit: Payer: 59

## 2020-04-17 DIAGNOSIS — R262 Difficulty in walking, not elsewhere classified: Secondary | ICD-10-CM

## 2020-04-17 DIAGNOSIS — M25552 Pain in left hip: Secondary | ICD-10-CM | POA: Diagnosis not present

## 2020-04-17 DIAGNOSIS — M6281 Muscle weakness (generalized): Secondary | ICD-10-CM

## 2020-04-17 NOTE — Therapy (Signed)
[facility] [address], [place], [address] Phone: [phone]   Fax:  [phone]  Physical Therapy Treatment And Progress Report (03/13/2020 - 04/17/2020)  Patient Details  Name: Micheal Clark MRN: [id number] Date of Birth: November 26, 1955 Referring Provider (PT): Lindi Adie, MD   Encounter Date: 04/17/2020   PT End of Session - 04/17/20 1328    Visit Number 10    Number of Visits 25    Date for PT Re-Evaluation 06/05/20    Authorization Type 10    Authorization Time Period of 10 progress report    PT Start Time 1329    PT Stop Time 1410    PT Time Calculation (min) 41 min    Activity Tolerance Patient tolerated treatment well    Behavior During Therapy [facility] for tasks assessed/performed           Past Medical History:  Diagnosis Date  . Diabetes mellitus without complication ([facility])   . Diabetes type 2, uncontrolled ([facility]) 11/15/2011  . Hyperlipidemia 11/15/2011  . Personal history of adenomatous colonic polyps 11/24/2011  . Smokeless tobacco use 10/11/2011    Past Surgical History:  Procedure Laterality Date  . ORIF ACETABULAR FRACTURE Left 1998   left  . WISDOM TOOTH EXTRACTION      There were no vitals filed for this visit.   Subjective Assessment - 04/17/20 1330    Subjective L hip is doing pretty good. Has been trying to walk a little bit without the [facility] but still has a limp.    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 Had a [place] 2000. Had L hip reconstruction. L hip got to bone on bone and surgery was the only option. Used a crutch R side secondary to L hip pain prior to surgery. No falls within the last 6 months. Fear of falling limites activities, but does not prevent pt from leaving his home. Pt lives in a 1 story home with wife. 4 steps to enter front door with B rail, 5 steps back door, no rails.    Patient Stated Goals Walk normally.                                      PT Education - 04/17/20 1859    Education Details ther-ex    Person(s) Educated Patient    Methods Explanation;Demonstration;Tactile cues;Verbal cues    Comprehension Returned demonstration;Verbalized understanding          Objective   Antalgic, decrease stance L LE with RW   No BP problems per pt.   No latex allergies   reviewed hip precautions with pt.   DOS: 03/10/2020  S/P5 - 6weeks   Medbridge Access Code [email or web address]     Therapeutic exercise    Posterior hip precautions maintained:   Supine PROM/AAROM hip extension, flexion (not past 90 degrees), and abduction   S/L manually resisted hip abduction  S/L hip abduction L 10x2  Bridge 10x3  Static mini lunge with R UE assist  forward L 10x3 Lateral  L 10x3 B UE assist to R UE assist    Standing hip extension with BUE support3x10L LE,with 5 second holds  Standing hip abduction with BUE support2x10L LE with 5 second holds  forward step up onto 1st regular step with B UE assist   L LE 10x  Then with R  UE assist L 10x2  lateral step up onto 1st regular step with B UE assist 10x2 L LE     Improved exercise technique, movement at target joints, use of target muscles after min to mod verbal, visual, tactile cues.  Pt response/clinical impression: Pt demonstrates overall L hip strength as well as ability to ambulate with less difficulty, now able to ambulate mod I with SPC. Pt also demonstrates improved L hip ROM since initial evaluation. Pt making progress with PT towards goals. Pt will benefit from continued skilled physical therapy services to improve strength , balance, function, and ability to ambulate with less difficulty.        PT Short Term Goals - 04/17/20 1332      PT SHORT TERM GOAL #1   Title Pt will be independent with his initial HEP to  improve ROM, strength, and ability to ambulate with less diffiulty.    Baseline Pt has started his HEP (03/13/2020); Does his HEP, no questions (04/17/2020)    Time 3    Period Weeks    Status Achieved    Target Date 04/03/20             PT Long Term Goals - 04/17/20 1336      PT LONG TERM GOAL #1   Title Patient will improve his L hip extension PROM to 0 degrees, flexion to 90 degrees, and abduction to 30 degrees to promote ability to perform standing tasks with less difficulty.    Baseline PROM L hip extension - 8 degrees, hip flexion 50 degrees, hip abduction 7 degrees (03/13/2020); L hip extension -1 degrees, hip flexion 80 degrees AAROM, hip abduction (supine) AAROM 17 degrees (04/17/2020)    Time 12    Period Weeks    Status Partially Met    Target Date 06/05/20      PT LONG TERM GOAL #2   Title Patient will have at least 4/5 L hip abduction, flexion, and extension strength to promote ability to ambulate with less difficulty.    Baseline L hip strength not yet tested secondary to being only 3 days post op (03/13/2020); Hip abduction 4/5, seated hip flexion  4/5(04/17/2020)    Time 12    Period Weeks    Status Partially Met    Target Date 06/05/20      PT LONG TERM GOAL #3   Title Patient will be able to ambulate at least 500 ft without AD to promote mobility.    Baseline Pt currently ambulates with rw (03/13/2020); able to ambulate mod I with SPC (04/17/2020)    Time 12    Period Weeks    Status Partially Met    Target Date 06/05/20      PT LONG TERM GOAL #4   Title Patient will improve his FOTO score by at least 10 points as a demonstration of improved function.    Baseline Hip FOTO 30 (03/13/2020); 58 (04/17/2020)    Time 12    Period Weeks    Status Achieved    Target Date 06/05/20                 Plan - 04/17/20 1328    Clinical Impression Statement Pt demonstrates overall L hip strength as well as ability to ambulate with less difficulty, now able to ambulate  mod I with SPC. Pt also demonstrates improved L hip ROM since initial evaluation. Pt making progress with PT towards goals. Pt will benefit from continued skilled physical therapy  services to improve strength , balance, function, and ability to ambulate with less difficulty.    Personal Factors and Comorbidities Past/Current Experience;Comorbidity 1    Comorbidities DM    Examination-Activity Limitations Transfers;Bed Mobility;Squat;Stairs    Stability/Clinical Decision Making Stable/Uncomplicated    Rehab Potential Good    PT Frequency 2x / week    PT Duration 12 weeks    PT Treatment/Interventions Therapeutic activities;Functional mobility training;Therapeutic exercise;Balance training;Neuromuscular re-education;Patient/family education;Manual techniques;Dry needling;Passive range of motion;Aquatic Therapy;Electrical Stimulation;Iontophoresis 29m/ml Dexamethasone;Gait training;Stair training    PT Next Visit Plan Maintain precautions. PROM. A/AROM, functional strengthening, manual techniques, modalities PRN    PT Home Exercise Plan Medbridge Access Code [email or web address]    Consulted and Agree with Plan of Care Patient           Patient will benefit from skilled therapeutic intervention in order to improve the following deficits and impairments:  Pain,Postural dysfunction,Improper body mechanics,Abnormal gait,Decreased range of motion,Decreased strength,Difficulty walking,Increased fascial restricitons  Visit Diagnosis: Pain in left hip  Difficulty in walking, not elsewhere classified  Muscle weakness (generalized)     Problem List Patient Active Problem List   Diagnosis Date Noted  . Post-traumatic osteoarthritis of left hip 06/26/2015  . Personal history of adenomatous colonic polyps 11/24/2011  . Controlled type 2 diabetes mellitus without complication, without long-term current use of insulin ([place] 11/15/2011  . Hyperlipidemia LDL goal <70 11/15/2011  . Smokeless tobacco use  10/11/2011  . Obesity 10/11/2011     Thank you for your referral.  MJoneen BoersPT, DPT  04/17/2020, 7:00 PM  [facility] [address] [place] [id number]: [phone]  Fax:  [phone] Name: Micheal SMITHSONMRN: [id number] of Birth: [birth year]

## 2020-04-17 NOTE — Telephone Encounter (Signed)
Spoke with patient scheduled CPE with fasting labs 

## 2020-04-21 ENCOUNTER — Other Ambulatory Visit: Payer: Self-pay

## 2020-04-21 ENCOUNTER — Ambulatory Visit: Payer: 59

## 2020-04-21 ENCOUNTER — Other Ambulatory Visit: Payer: Self-pay | Admitting: Family Medicine

## 2020-04-21 DIAGNOSIS — M6281 Muscle weakness (generalized): Secondary | ICD-10-CM

## 2020-04-21 DIAGNOSIS — E785 Hyperlipidemia, unspecified: Secondary | ICD-10-CM

## 2020-04-21 DIAGNOSIS — R262 Difficulty in walking, not elsewhere classified: Secondary | ICD-10-CM

## 2020-04-21 DIAGNOSIS — Z79899 Other long term (current) drug therapy: Secondary | ICD-10-CM

## 2020-04-21 DIAGNOSIS — M25552 Pain in left hip: Secondary | ICD-10-CM | POA: Diagnosis not present

## 2020-04-21 DIAGNOSIS — E119 Type 2 diabetes mellitus without complications: Secondary | ICD-10-CM

## 2020-04-21 DIAGNOSIS — Z114 Encounter for screening for human immunodeficiency virus [HIV]: Secondary | ICD-10-CM

## 2020-04-21 DIAGNOSIS — Z125 Encounter for screening for malignant neoplasm of prostate: Secondary | ICD-10-CM
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
## 2020-04-23 ENCOUNTER — Other Ambulatory Visit: Payer: Self-pay

## 2020-04-23 ENCOUNTER — Other Ambulatory Visit (INDEPENDENT_AMBULATORY_CARE_PROVIDER_SITE_OTHER): Payer: 59

## 2020-04-23 DIAGNOSIS — E785 Hyperlipidemia, unspecified: Secondary | ICD-10-CM

## 2020-04-23 DIAGNOSIS — Z79899 Other long term (current) drug therapy: Secondary | ICD-10-CM

## 2020-04-23 DIAGNOSIS — E119 Type 2 diabetes mellitus without complications: Secondary | ICD-10-CM | POA: Diagnosis not present

## 2020-04-23 DIAGNOSIS — Z125 Encounter for screening for malignant neoplasm of prostate: Secondary | ICD-10-CM

## 2020-04-23 DIAGNOSIS — Z114 Encounter for screening for human immunodeficiency virus [HIV]: Secondary | ICD-10-CM

## 2020-04-23 LAB — CBC WITH DIFFERENTIAL/PLATELET
Basophils Absolute: 0.1 10*3/uL (ref 0.0–0.1)
Basophils Relative: 0.6 % (ref 0.0–3.0)
Eosinophils Absolute: 0.1 10*3/uL (ref 0.0–0.7)
Eosinophils Relative: 1.5 % (ref 0.0–5.0)
HCT: 43.4 % (ref 39.0–52.0)
Hemoglobin: 14.8 g/dL (ref 13.0–17.0)
Lymphocytes Relative: 36.8 % (ref 12.0–46.0)
Lymphs Abs: 3.4 10*3/uL (ref 0.7–4.0)
MCHC: 34.1 g/dL (ref 30.0–36.0)
MCV: 90.1 fl (ref 78.0–100.0)
Monocytes Absolute: 0.6 10*3/uL (ref 0.1–1.0)
Monocytes Relative: 6.1 % (ref 3.0–12.0)
Neutro Abs: 5.1 10*3/uL (ref 1.4–7.7)
Neutrophils Relative %: 55 % (ref 43.0–77.0)
Platelets: 279 10*3/uL (ref 150.0–400.0)
RBC: 4.81 Mil/uL (ref 4.22–5.81)
RDW: 13.2 % (ref 11.5–15.5)
WBC: 9.3 10*3/uL (ref 4.0–10.5)

## 2020-04-23 LAB — LIPID PANEL
Cholesterol: 169 mg/dL (ref 0–200)
HDL: 45.5 mg/dL (ref >39.00)
LDL Cholesterol: 95 mg/dL (ref 0–99)
NonHDL: 123.94
Total CHOL/HDL Ratio: 4
Triglycerides: 145 mg/dL (ref 0.0–149.0)
VLDL: 29 mg/dL (ref 0.0–40.0)

## 2020-04-23 LAB — BASIC METABOLIC PANEL
BUN: 11 mg/dL (ref 6–23)
CO2: 28 mEq/L (ref 19–32)
Calcium: 9.4 mg/dL (ref 8.4–10.5)
Chloride: 103 mEq/L (ref 96–112)
Creatinine, Ser: 0.85 mg/dL (ref 0.40–1.50)
GFR: 92.62 mL/min (ref >60.00)
Glucose, Bld: 117 mg/dL — ABNORMAL HIGH (ref 70–99)
Potassium: 4 mEq/L (ref 3.5–5.1)
Sodium: 142 mEq/L (ref 135–145)

## 2020-04-23 LAB — HEPATIC FUNCTION PANEL
ALT: 10 U/L (ref 0–53)
AST: 12 U/L (ref 0–37)
Albumin: 4.2 g/dL (ref 3.5–5.2)
Alkaline Phosphatase: 72 U/L (ref 39–117)
Bilirubin, Direct: 0.1 mg/dL (ref 0.0–0.3)
Total Bilirubin: 0.5 mg/dL (ref 0.2–1.2)
Total Protein: 7.3 g/dL (ref 6.0–8.3)

## 2020-04-23 LAB — MICROALBUMIN / CREATININE URINE RATIO
Creatinine,U: 232 mg/dL
Microalb Creat Ratio: 0.6 mg/g (ref 0.0–30.0)
Microalb, Ur: 1.4 mg/dL (ref 0.0–1.9)

## 2020-04-23 LAB — HEMOGLOBIN A1C: Hgb A1c MFr Bld: 6.4 % (ref 4.6–6.5)

## 2020-04-24 ENCOUNTER — Ambulatory Visit: Payer: 59

## 2020-04-24 DIAGNOSIS — M25552 Pain in left hip: Secondary | ICD-10-CM

## 2020-04-24 DIAGNOSIS — M6281 Muscle weakness (generalized): Secondary | ICD-10-CM

## 2020-04-24 DIAGNOSIS — R262 Difficulty in walking, not elsewhere classified: Secondary | ICD-10-CM

## 2020-04-24 LAB — PSA, TOTAL WITH REFLEX TO PSA, FREE: PSA, Total: 0.4 ng/mL (ref <=4.0)

## 2020-04-24 LAB — HIV ANTIBODY (ROUTINE TESTING W REFLEX): HIV 1&2 Ab, 4th Generation: NONREACTIVE
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
## 2020-04-28 ENCOUNTER — Other Ambulatory Visit: Payer: Self-pay

## 2020-04-28 ENCOUNTER — Ambulatory Visit: Payer: 59 | Attending: Physician Assistant

## 2020-04-28 DIAGNOSIS — M6281 Muscle weakness (generalized): Secondary | ICD-10-CM | POA: Insufficient documentation

## 2020-04-28 DIAGNOSIS — R262 Difficulty in walking, not elsewhere classified: Secondary | ICD-10-CM | POA: Insufficient documentation

## 2020-04-28 DIAGNOSIS — M25552 Pain in left hip: Secondary | ICD-10-CM | POA: Diagnosis present
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
## 2020-04-30 ENCOUNTER — Ambulatory Visit (INDEPENDENT_AMBULATORY_CARE_PROVIDER_SITE_OTHER): Payer: 59 | Admitting: Family Medicine

## 2020-04-30 ENCOUNTER — Other Ambulatory Visit: Payer: Self-pay

## 2020-04-30 ENCOUNTER — Encounter: Payer: Self-pay | Admitting: Family Medicine

## 2020-04-30 VITALS — BP 112/70 | HR 88 | Temp 98.3°F | Ht 67.5 in | Wt 241.2 lb

## 2020-04-30 DIAGNOSIS — Z23 Encounter for immunization: Secondary | ICD-10-CM

## 2020-04-30 DIAGNOSIS — Z1211 Encounter for screening for malignant neoplasm of colon: Secondary | ICD-10-CM

## 2020-04-30 DIAGNOSIS — E119 Type 2 diabetes mellitus without complications: Secondary | ICD-10-CM | POA: Diagnosis not present

## 2020-04-30 DIAGNOSIS — Z0000 Encounter for general adult medical examination without abnormal findings: Secondary | ICD-10-CM | POA: Diagnosis not present

## 2020-04-30 NOTE — Progress Notes (Signed)
Micheal Clark T. Micheal Charnley, MD, [place] [facility] [place], [address]  Phone: [phone]  FAX: [phone]  Micheal Clark - 65 y.o. male  MRN [id number]  Date of Birth: [birth year]  Date: 04/30/2020  PCP: Owens Loffler, MD  Referral: Owens Loffler, MD  Chief Complaint  Patient presents with  . Annual Exam    This visit occurred during the SARS-CoV-2 public health emergency.  Safety protocols were in place, including screening questions prior to the visit, additional usage of staff PPE, and extensive cleaning of exam room while observing appropriate contact time as indicated for disinfecting solutions.   Patient Care Team: Owens Loffler, MD as PCP - General (Family Medicine) Subjective:   Micheal Clark is a 65 y.o. pleasant patient who presents with the following:  Preventative Health Maintenance Visit:  Health Maintenance Summary Reviewed and updated, unless pt declines services.  Tobacco History Reviewed.  He does still dips snuff. Alcohol: No concerns, no excessive use Exercise Habits: He has severe hip arthritis has limited his ability to exercise, and now he is in recovery after his hip replacement. STD concerns: no risk or activity to increase risk Drug Use: None  Diabetes Mellitus: Tolerating Medications: yes Compliance with diet: fair, Body mass index is 37.23 kg/m. Exercise: minimal / intermittent Avg blood sugars at home: not checking Foot problems: none Hypoglycemia: none No nausea, vomitting, blurred vision, polyuria.  Lab Results  Component Value Date   HGBA1C 6.4 04/23/2020   HGBA1C 6.6 (A) 09/06/2019   HGBA1C 10.2 (A) 05/30/2019   Lab Results  Component Value Date   MICROALBUR 1.4 04/23/2020   [place] 95 04/23/2020   CREATININE 0.85 04/23/2020    Wt Readings from Last 3 Encounters:  04/30/20 241 lb 4 oz (109.4 kg)  09/06/19 237  lb (107.5 kg)  05/30/19 219 lb 12 oz (99.7 kg)    Pneumovax Tdap Colonoscopy   Health Maintenance  Topic Date Due  . OPHTHALMOLOGY EXAM  Never done  . COLONOSCOPY (Pts 45-22yrs Insurance coverage will need to be confirmed)  11/24/2014  . FOOT EXAM  08/26/2015  . INFLUENZA VACCINE  08/25/2020  . HEMOGLOBIN A1C  10/24/2020  . URINE MICROALBUMIN  04/23/2021  . TETANUS/TDAP  05/01/2030  . PNEUMOCOCCAL POLYSACCHARIDE VACCINE AGE 56-64 HIGH RISK  Completed  . COVID-19 Vaccine  Completed  . Hepatitis C Screening  Completed  . HIV Screening  Completed  . HPV VACCINES  Aged Out   Immunization History  Administered Date(s) Administered  . Moderna Sars-Covid-2 Vaccination 02/13/2019, 02/26/2019, 03/13/2019, 03/26/2019, 12/14/2019  . Pneumococcal Polysaccharide-23 04/30/2020  . Tdap 04/30/2020   Patient Active Problem List   Diagnosis Date Noted  . Status post total replacement of left hip 03/10/2020  . Post-traumatic osteoarthritis of left hip 06/26/2015  . Personal history of adenomatous colonic polyps 11/24/2011  . Controlled type 2 diabetes mellitus without complication, without long-term current use of insulin ([place]) 11/15/2011  . Hyperlipidemia LDL goal <70 11/15/2011  . Smokeless tobacco use 10/11/2011  . Obesity 10/11/2011    Past Medical History:  Diagnosis Date  . Diabetes mellitus without complication ([place])   . Diabetes type 2, uncontrolled ([place]) 11/15/2011  . Hyperlipidemia 11/15/2011  . Personal history of adenomatous colonic polyps 11/24/2011  . Smokeless tobacco use 10/11/2011    Past Surgical History:  Procedure Laterality Date  . ORIF ACETABULAR FRACTURE Left 1998   left  .  WISDOM TOOTH EXTRACTION      Family History  Problem Relation Age of Onset  . Diabetes Mother   . Hypertension Brother   . Vision loss Paternal Grandfather   . Colon cancer Neg Hx   . Rectal cancer Neg Hx   . Stomach cancer Neg Hx     Past Medical History, Surgical History,  Social History, Family History, Problem List, Medications, and Allergies have been reviewed and updated if relevant.  Review of Systems: Pertinent positives are listed above.  Otherwise, a full 14 point review of systems has been done in full and it is negative except where it is noted positive.  Objective:   BP 112/70   Pulse 88   Temp 98.3 F (36.8 C) (Temporal)   Ht 5' 7.5" (1.715 m)   Wt 241 lb 4 oz (109.4 kg)   SpO2 95%   BMI 37.23 kg/m  Ideal Body Weight: Weight in (lb) to have BMI = 25: 161.7  Ideal Body Weight: Weight in (lb) to have BMI = 25: 161.7 No exam data present Depression screen [facility] 2/9 04/30/2020 10/05/2018 07/01/2016  Decreased Interest 0 0 1  Down, Depressed, Hopeless 0 0 0  PHQ - 2 Score 0 0 1     GEN: well developed, well nourished, no acute distress Eyes: conjunctiva and lids normal, PERRLA, EOMI ENT: TM clear, nares clear, oral exam WNL Neck: supple, no lymphadenopathy, no thyromegaly, no JVD Pulm: clear to auscultation and percussion, respiratory effort normal CV: regular rate and rhythm, S1-S2, no murmur, rub or gallop, no bruits, peripheral pulses normal and symmetric, no cyanosis, clubbing, edema or varicosities GI: soft, non-tender; no hepatosplenomegaly, masses; active bowel sounds all quadrants GU: deferred Lymph: no cervical, axillary or inguinal adenopathy MSK: gait normal, muscle tone and strength WNL, no joint swelling, effusions, discoloration, crepitus  SKIN: clear, good turgor, color WNL, no rashes, lesions, or ulcerations Neuro: normal mental status, normal strength, sensation, and motion Psych: alert; oriented to person, place and time, normally interactive and not anxious or depressed in appearance.  All labs reviewed with patient. Results for orders placed or performed in visit on 04/23/20  HIV Antibody (routine testing w rflx)  Result Value Ref Range   HIV 1&2 Ab, 4th Generation NON-REACTIVE NON-REACTI  PSA, Total with Reflex to PSA,  Free  Result Value Ref Range   PSA, Total 0.4 < OR = 4.0 ng/mL  Microalbumin / creatinine urine ratio  Result Value Ref Range   Microalb, Ur 1.4 0.0 - 1.9 mg/dL   Creatinine,U 232.0 mg/dL   Microalb Creat Ratio 0.6 0.0 - 30.0 mg/g  Hemoglobin A1c  Result Value Ref Range   Hgb A1c MFr Bld 6.4 4.6 - 6.5 %  CBC with Differential/Platelet  Result Value Ref Range   WBC 9.3 4.0 - 10.5 K/uL   RBC 4.81 4.22 - 5.81 Mil/uL   Hemoglobin 14.8 13.0 - 17.0 g/dL   HCT 43.4 39.0 - 52.0 %   MCV 90.1 78.0 - 100.0 fl   MCHC 34.1 30.0 - 36.0 g/dL   RDW 13.2 11.5 - 15.5 %   Platelets 279.0 150.0 - 400.0 K/uL   Neutrophils Relative % 55.0 43.0 - 77.0 %   Lymphocytes Relative 36.8 12.0 - 46.0 %   Monocytes Relative 6.1 3.0 - 12.0 %   Eosinophils Relative 1.5 0.0 - 5.0 %   Basophils Relative 0.6 0.0 - 3.0 %   Neutro Abs 5.1 1.4 - 7.7 K/uL   Lymphs Abs  3.4 0.7 - 4.0 K/uL   Monocytes Absolute 0.6 0.1 - 1.0 K/uL   Eosinophils Absolute 0.1 0.0 - 0.7 K/uL   Basophils Absolute 0.1 0.0 - 0.1 K/uL  Basic metabolic panel  Result Value Ref Range   Sodium 142 135 - 145 mEq/L   Potassium 4.0 3.5 - 5.1 mEq/L   Chloride 103 96 - 112 mEq/L   CO2 28 19 - 32 mEq/L   Glucose, Bld 117 (H) 70 - 99 mg/dL   BUN 11 6 - 23 mg/dL   Creatinine, Ser 0.85 0.40 - 1.50 mg/dL   GFR 92.62 >60.00 mL/min   Calcium 9.4 8.4 - 10.5 mg/dL  Hepatic function panel  Result Value Ref Range   Total Bilirubin 0.5 0.2 - 1.2 mg/dL   Bilirubin, Direct 0.1 0.0 - 0.3 mg/dL   Alkaline Phosphatase 72 39 - 117 U/L   AST 12 0 - 37 U/L   ALT 10 0 - 53 U/L   Total Protein 7.3 6.0 - 8.3 g/dL   Albumin 4.2 3.5 - 5.2 g/dL  Lipid panel  Result Value Ref Range   Cholesterol 169 0 - 200 mg/dL   Triglycerides 145.0 0.0 - 149.0 mg/dL   HDL 45.50 >39.00 mg/dL   VLDL 29.0 0.0 - 40.0 mg/dL   LDL Cholesterol 95 0 - 99 mg/dL   Total CHOL/HDL Ratio 4    NonHDL 123.94     Assessment and Plan:     ICD-10-CM   1. Healthcare maintenance  Z00.00  Tdap vaccine greater than or equal to 7yo IM    Pneumococcal polysaccharide vaccine 23-valent greater than or equal to 2yo subcutaneous/IM  2. Screen for colon cancer  Z12.11 Ambulatory referral to Gastroenterology  3. Controlled type 2 diabetes mellitus without complication, without long-term current use of insulin (HCC)  E11.9 Pneumococcal polysaccharide vaccine 23-valent greater than or equal to 2yo subcutaneous/IM  4. Need for 23-polyvalent pneumococcal polysaccharide vaccine  Z23 Pneumococcal polysaccharide vaccine 23-valent greater than or equal to 2yo subcutaneous/IM  5. Need for Tdap vaccination  Z23 Tdap vaccine greater than or equal to 7yo IM   His diabetes is doing much better.  He has not eaten quite as well since he had his hip replacement.  Vaccinations as above.  He does need to have a repeat colonoscopy, he did have polyps on his last exam.  Primarily, I encouraged him to be more active as he is able with his hip, and to work on his weight.  Health Maintenance Exam: The patient's preventative maintenance and recommended screening tests for an annual wellness exam were reviewed in full today. Brought up to date unless services declined.  Counselled on the importance of diet, exercise, and its role in overall health and mortality. The patient's FH and SH was reviewed, including their home life, tobacco status, and drug and alcohol status.  Follow-up in 1 year for physical exam or additional follow-up below.  Follow-up: No follow-ups on file. Or follow-up in 1 year if not noted.  No orders of the defined types were placed in this encounter.  Medications Discontinued During This Encounter  Medication Reason  . traMADol (ULTRAM) 50 MG tablet Completed Course   Orders Placed This Encounter  Procedures  . Tdap vaccine greater than or equal to 7yo IM  . Pneumococcal polysaccharide vaccine 23-valent greater than or equal to 2yo subcutaneous/IM  . Ambulatory referral to  Gastroenterology    Signed,  Frederico Hamman T. Edgar Corrigan, MD   Allergies as of  04/30/2020   No Known Allergies     Medication List       Accurate as of April 30, 2020 10:50 AM. If you have any questions, ask your nurse or doctor.        STOP taking these medications   traMADol 50 MG tablet Commonly known as: ULTRAM Stopped by: Owens Loffler, MD     TAKE these medications   aspirin 81 MG tablet Take 81 mg by mouth daily.   glipiZIDE 10 MG 24 hr tablet Commonly known as: GLUCOTROL XL TAKE TWO TABLETS BY MOUTH EVERY MORNING WITH BREAKFAST   metFORMIN 500 MG 24 hr tablet Commonly known as: GLUCOPHAGE-XR TAKE FOUR TABLETS BY MOUTH EVERY MORNING WITH FOOD   pioglitazone 45 MG tablet Commonly known as: ACTOS TAKE ONE TABLET BY MOUTH DAILY   simvastatin 40 MG tablet Commonly known as: ZOCOR Take 1 tablet (40 mg total) by mouth at bedtime.   vitamin B-12 1000 MCG tablet Commonly known as: CYANOCOBALAMIN Take 1,000 mcg by mouth daily.

## 2020-05-01 ENCOUNTER — Ambulatory Visit: Payer: 59

## 2020-05-01 DIAGNOSIS — M25552 Pain in left hip: Secondary | ICD-10-CM

## 2020-05-01 DIAGNOSIS — R262 Difficulty in walking, not elsewhere classified: Secondary | ICD-10-CM

## 2020-05-01 DIAGNOSIS — M6281 Muscle weakness (generalized): Secondary | ICD-10-CM

## 2020-05-01 NOTE — Therapy (Signed)
[facility] [address] [place], [address] Phone: [phone]   Fax:  [phone]  Physical Therapy Treatment  Patient Details  Name: Micheal Clark MRN: [id number] Date of Birth: 12-28-1955 Referring Provider (PT): Rema Jasmine, MD   Encounter Date: 05/01/2020   PT End of Session - 05/01/20 1545    Visit Number 14    Number of Visits 25    Date for PT Re-Evaluation 06/05/20    Authorization Type 4    Authorization Time Period of 10 progress report    PT Start Time 1545    PT Stop Time 1625    PT Time Calculation (min) 40 min    Activity Tolerance Patient tolerated treatment well    Behavior During Therapy [facility] for tasks assessed/performed           Past Medical History:  Diagnosis Date  . Diabetes mellitus without complication ([place])   . Diabetes type 2, uncontrolled ([place]) 11/15/2011  . Hyperlipidemia 11/15/2011  . Personal history of adenomatous colonic polyps 11/24/2011  . Smokeless tobacco use 10/11/2011    Past Surgical History:  Procedure Laterality Date  . ORIF ACETABULAR FRACTURE Left 1998   left  . TOTAL HIP ARTHROPLASTY Left 2021  . WISDOM TOOTH EXTRACTION      There were no vitals filed for this visit.   Subjective Assessment - 05/01/20 1546    Subjective Walking without the cane seems to be doing pretty good. No pain.    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 Had a [place] 2000. Had L hip reconstruction. L hip got to bone on bone and surgery was the only option. Used a crutch R side secondary to L hip pain prior to surgery. No falls within the last 6 months. Fear of falling limites activities, but does not prevent pt from leaving his home. Pt lives in a 1 story home with wife. 4 steps to enter front door with B rail, 5 steps back door, no rails.    Patient Stated Goals Walk normally.    Currently in Pain? No/denies                                      PT Education - 05/01/20 1547    Education Details ther-ex    Person(s) Educated Patient    Methods Explanation;Demonstration;Tactile cues;Verbal cues    Comprehension Returned demonstration;Verbalized understanding          Objective   Antalgic, decrease stance L LE with RW   No BP problems per pt.   No latex allergies   reviewed hip precautions with pt.   DOS: 03/10/2020    Next MD appointment 05/06/2020  Medbridge Access Code [email or web address]     Therapeutic exercise    Posterior hip precautions maintained:   Side stepping 32 ft to the L and 32 ft to the R 3x no AD  Standing on air ex pad              Mini squats without UE assist 10x3  Forward step up onto Air Ex pad with on UE to no UE assist   L LE 10x3  Lateral step up onto Air Ex pad with one UE to no UE assist   L LE 10x3  Static mini lunge onto Dyna Disc with R UE assist   L LE  10x3  SLS with R UElight touch assist L 10x5 seconds for 3 sets  Stepping over 6 mini hurdles, no AD   standing hip abduction yellow band around ankles with B UE assist   L 10x3  R 10x3 (for closed chain L glute med muscle strengthening. )   Sit <> stand from chair with Air Ex pad and pillow without UE assist 10x2 (hip precautions maintained)       Improved exercise technique, movement at target joints, use of target muscles after min to mod verbal, visual, tactile cues.  Pt response/clinical impression: Improving functional strength observed. Worked on stabilizing muscles in closed chain with addition to general L LE strengthening to promote ability to ambulate and perform standing tasks with less difficulty. Pt tolerated session well without aggravation of symptoms. Pt will benefit from continued skilled physical therapy services to improve strength and function.          PT Short Term Goals - 04/17/20 1332      PT SHORT TERM GOAL #1    Title Pt will be independent with his initial HEP to improve ROM, strength, and ability to ambulate with less diffiulty.    Baseline Pt has started his HEP (03/13/2020); Does his HEP, no questions (04/17/2020)    Time 3    Period Weeks    Status Achieved    Target Date 04/03/20             PT Long Term Goals - 04/17/20 1336      PT LONG TERM GOAL #1   Title Patient will improve his L hip extension PROM to 0 degrees, flexion to 90 degrees, and abduction to 30 degrees to promote ability to perform standing tasks with less difficulty.    Baseline PROM L hip extension - 8 degrees, hip flexion 50 degrees, hip abduction 7 degrees (03/13/2020); L hip extension -1 degrees, hip flexion 80 degrees AAROM, hip abduction (supine) AAROM 17 degrees (04/17/2020)    Time 12    Period Weeks    Status Partially Met    Target Date 06/05/20      PT LONG TERM GOAL #2   Title Patient will have at least 4/5 L hip abduction, flexion, and extension strength to promote ability to ambulate with less difficulty.    Baseline L hip strength not yet tested secondary to being only 3 days post op (03/13/2020); Hip abduction 4/5, seated hip flexion  4/5(04/17/2020)    Time 12    Period Weeks    Status Partially Met    Target Date 06/05/20      PT LONG TERM GOAL #3   Title Patient will be able to ambulate at least 500 ft without AD to promote mobility.    Baseline Pt currently ambulates with rw (03/13/2020); able to ambulate mod I with SPC (04/17/2020)    Time 12    Period Weeks    Status Partially Met    Target Date 06/05/20      PT LONG TERM GOAL #4   Title Patient will improve his FOTO score by at least 10 points as a demonstration of improved function.    Baseline Hip FOTO 30 (03/13/2020); 58 (04/17/2020)    Time 12    Period Weeks    Status Achieved    Target Date 06/05/20                 Plan - 05/01/20 1548    Clinical Impression Statement Improving functional strength  observed. Worked on  stabilizing muscles in closed chain with addition to general L LE strengthening to promote ability to ambulate and perform standing tasks with less difficulty. Pt tolerated session well without aggravation of symptoms. Pt will benefit from continued skilled physical therapy services to improve strength and function.    Personal Factors and Comorbidities Past/Current Experience;Comorbidity 1    Comorbidities DM    Examination-Activity Limitations Transfers;Bed Mobility;Squat;Stairs    Stability/Clinical Decision Making Stable/Uncomplicated    Rehab Potential Good    PT Frequency 2x / week    PT Duration 12 weeks    PT Treatment/Interventions Therapeutic activities;Functional mobility training;Therapeutic exercise;Balance training;Neuromuscular re-education;Patient/family education;Manual techniques;Dry needling;Passive range of motion;Aquatic Therapy;Electrical Stimulation;Iontophoresis 25m/ml Dexamethasone;Gait training;Stair training    PT Next Visit Plan Maintain precautions. PROM. A/AROM, functional strengthening, manual techniques, modalities PRN    PT Home Exercise Plan Medbridge Access Code [email or web address]    Consulted and Agree with Plan of Care Patient           Patient will benefit from skilled therapeutic intervention in order to improve the following deficits and impairments:  Pain,Postural dysfunction,Improper body mechanics,Abnormal gait,Decreased range of motion,Decreased strength,Difficulty walking,Increased fascial restricitons  Visit Diagnosis: Pain in left hip  Difficulty in walking, not elsewhere classified  Muscle weakness (generalized)     Problem List Patient Active Problem List   Diagnosis Date Noted  . Status post total replacement of left hip 03/10/2020  . Post-traumatic osteoarthritis of left hip 06/26/2015  . Personal history of adenomatous colonic polyps 11/24/2011  . Controlled type 2 diabetes mellitus without complication, without long-term current use of  insulin ([place] 11/15/2011  . Hyperlipidemia LDL goal <70 11/15/2011  . Smokeless tobacco use 10/11/2011  . Obesity 10/11/2011    MJoneen BoersPT, DPT   05/01/2020, 4:26 PM  [facility] [address] [place] [address]Phone: [phone]  Fax:  [phone] Name: Micheal LENKERMRN: [id number] of Birth: [birth year]

## 2020-05-05 ENCOUNTER — Ambulatory Visit: Payer: 59

## 2020-05-05 ENCOUNTER — Other Ambulatory Visit: Payer: Self-pay

## 2020-05-05 DIAGNOSIS — M25552 Pain in left hip: Secondary | ICD-10-CM | POA: Diagnosis not present

## 2020-05-05 DIAGNOSIS — R262 Difficulty in walking, not elsewhere classified: Secondary | ICD-10-CM

## 2020-05-05 DIAGNOSIS — M6281 Muscle weakness (generalized): Secondary | ICD-10-CM

## 2020-05-05 NOTE — Therapy (Signed)
[facility] [address] [place], [address] Phone: [phone]   Fax:  [phone]  Physical Therapy Treatment  Patient Details  Name: Micheal Clark MRN: [id number] Date of Birth: 10-01-1955 Referring Provider (PT): Rema Jasmine, MD   Encounter Date: 05/05/2020   PT End of Session - 05/05/20 1103    Visit Number 15    Number of Visits 25    Date for PT Re-Evaluation 06/05/20    Authorization Type 5    Authorization Time Period of 10 progress report    PT Start Time 1103    PT Stop Time 1129    PT Time Calculation (min) 26 min    Activity Tolerance Patient tolerated treatment well    Behavior During Therapy [facility] for tasks assessed/performed           Past Medical History:  Diagnosis Date  . Diabetes mellitus without complication ([place])   . Diabetes type 2, uncontrolled ([place]) 11/15/2011  . Hyperlipidemia 11/15/2011  . Personal history of adenomatous colonic polyps 11/24/2011  . Smokeless tobacco use 10/11/2011    Past Surgical History:  Procedure Laterality Date  . ORIF ACETABULAR FRACTURE Left 1998   left  . TOTAL HIP ARTHROPLASTY Left 2021  . WISDOM TOOTH EXTRACTION      There were no vitals filed for this visit.   Subjective Assessment - 05/05/20 1104    Subjective Sore in low back and L thigh. Might have overdone it this weekent. No pain.    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 Had a [place] 2000. Had L hip reconstruction. L hip got to bone on bone and surgery was the only option. Used a crutch R side secondary to L hip pain prior to surgery. No falls within the last 6 months. Fear of falling limites activities, but does not prevent pt from leaving his home. Pt lives in a 1 story home with wife. 4 steps to enter front door with B rail, 5 steps back door, no rails.    Patient Stated Goals Walk normally.    Currently in Pain? No/denies                                      PT Education - 05/05/20 1107    Education Details ther-ex    Person(s) Educated Patient    Methods Explanation;Demonstration;Tactile cues;Verbal cues    Comprehension Returned demonstration;Verbalized understanding          Objective   Antalgic, decrease stance L LE with RW   No BP problems per pt.   No latex allergies   reviewed hip precautions with pt.   DOS: 03/10/2020    Next MD appointment 05/06/2020  Medbridge Access Code [email or web address]     Therapeutic exercise    Posterior hip precautions maintained:    Sit <> stand from chair with Air Ex pad and pillow without UE assist 10x2 (hip precautions maintained)    SLS with R UElight touch assist L 10x5 seconds for 3 sets  Side stepping 32 ft to the L and 32 ft to the R yellow band distal thighs  Stepping over 6 mini hurdles, no AD 6x  Forward step up onto first regular step with R UE assist 10x3 L LE   Lateral step up onto first regular step with one UE assist 10x3   Improved  exercise technique, movement at target joints, use of target muscles after min to mod verbal, visual, tactile cues.  Pt response/clinical impression: Continued working on general L LE strengthening to promote ability to perform standing tasks with less difficulty. Able to negotiate stepping over obstacles without AD safely. Pt tolerated session well without aggravation of symptoms. Pt will benefit from continued skilled physical therapy services to improve strength, and function. Lighter session performed today secondary to reports of soreness from the weekend.  Possible graduation to HEP next visit. Pt states he will let PT know next session.          PT Short Term Goals - 04/17/20 1332      PT SHORT TERM GOAL #1   Title Pt will be independent with his initial HEP to improve ROM, strength, and ability to ambulate with less diffiulty.    Baseline Pt has  started his HEP (03/13/2020); Does his HEP, no questions (04/17/2020)    Time 3    Period Weeks    Status Achieved    Target Date 04/03/20             PT Long Term Goals - 04/17/20 1336      PT LONG TERM GOAL #1   Title Patient will improve his L hip extension PROM to 0 degrees, flexion to 90 degrees, and abduction to 30 degrees to promote ability to perform standing tasks with less difficulty.    Baseline PROM L hip extension - 8 degrees, hip flexion 50 degrees, hip abduction 7 degrees (03/13/2020); L hip extension -1 degrees, hip flexion 80 degrees AAROM, hip abduction (supine) AAROM 17 degrees (04/17/2020)    Time 12    Period Weeks    Status Partially Met    Target Date 06/05/20      PT LONG TERM GOAL #2   Title Patient will have at least 4/5 L hip abduction, flexion, and extension strength to promote ability to ambulate with less difficulty.    Baseline L hip strength not yet tested secondary to being only 3 days post op (03/13/2020); Hip abduction 4/5, seated hip flexion  4/5(04/17/2020)    Time 12    Period Weeks    Status Partially Met    Target Date 06/05/20      PT LONG TERM GOAL #3   Title Patient will be able to ambulate at least 500 ft without AD to promote mobility.    Baseline Pt currently ambulates with rw (03/13/2020); able to ambulate mod I with SPC (04/17/2020)    Time 12    Period Weeks    Status Partially Met    Target Date 06/05/20      PT LONG TERM GOAL #4   Title Patient will improve his FOTO score by at least 10 points as a demonstration of improved function.    Baseline Hip FOTO 30 (03/13/2020); 58 (04/17/2020)    Time 12    Period Weeks    Status Achieved    Target Date 06/05/20                 Plan - 05/05/20 1107    Clinical Impression Statement Standing on air ex pad               Mini squats without UE assist 10x3     Forward step up onto Air Ex pad with on UE to no UE assist  L LE 10x3         Lateral step up onto Air Ex pad  with one UE to no UE assist               L LE 10x3     Static mini lunge onto Dyna Disc with R UE assist               L LE 10x3   standing hip abduction yellow band around ankles with B UE assist               L 10x3              R 10x3 (for closed chain L glute med muscle strengthening. )    Personal Factors and Comorbidities Past/Current Experience;Comorbidity 1    Comorbidities DM    Examination-Activity Limitations Transfers;Bed Mobility;Squat;Stairs    Stability/Clinical Decision Making Stable/Uncomplicated    Clinical Decision Making Low    Rehab Potential Good    PT Frequency 2x / week    PT Duration 12 weeks    PT Treatment/Interventions Therapeutic activities;Functional mobility training;Therapeutic exercise;Balance training;Neuromuscular re-education;Patient/family education;Manual techniques;Dry needling;Passive range of motion;Aquatic Therapy;Electrical Stimulation;Iontophoresis 56m/ml Dexamethasone;Gait training;Stair training    PT Next Visit Plan Maintain precautions. PROM. A/AROM, functional strengthening, manual techniques, modalities PRN    PT Home Exercise Plan Medbridge Access Code [email or web address]    Consulted and Agree with Plan of Care Patient           Patient will benefit from skilled therapeutic intervention in order to improve the following deficits and impairments:  Pain,Postural dysfunction,Improper body mechanics,Abnormal gait,Decreased range of motion,Decreased strength,Difficulty walking,Increased fascial restricitons  Visit Diagnosis: Pain in left hip  Difficulty in walking, not elsewhere classified  Muscle weakness (generalized)     Problem List Patient Active Problem List   Diagnosis Date Noted  . Status post total replacement of left hip 03/10/2020  . Post-traumatic osteoarthritis of left hip 06/26/2015  . Personal history of adenomatous colonic polyps 11/24/2011  . Controlled type 2 diabetes mellitus without complication, without long-term current  use of insulin ([place] 11/15/2011  . Hyperlipidemia LDL goal <70 11/15/2011  . Smokeless tobacco use 10/11/2011  . Obesity 10/11/2011    MJoneen BoersPT, DPT   05/05/2020, 11:32 AM  [facility] [address] [place] [address]Phone: [phone]  Fax:  [phone] Name: MKAITLYN SKOWRONMRN: [id number] of Birth: [birth year]

## 2020-05-08 ENCOUNTER — Ambulatory Visit: Payer: 59

## 2020-05-13 ENCOUNTER — Ambulatory Visit: Payer: 59

## 2020-05-13 ENCOUNTER — Other Ambulatory Visit: Payer: Self-pay

## 2020-05-13 DIAGNOSIS — R262 Difficulty in walking, not elsewhere classified: Secondary | ICD-10-CM

## 2020-05-13 DIAGNOSIS — M6281 Muscle weakness (generalized): Secondary | ICD-10-CM

## 2020-05-13 DIAGNOSIS — M25552 Pain in left hip: Secondary | ICD-10-CM

## 2020-05-13 NOTE — Therapy (Signed)
[facility] [address], [place], [address] Phone: [phone]   Fax:  [phone]  Physical Therapy Treatment And Discharge Summary  Patient Details  Name: Micheal Clark MRN: [id number] Date of Birth: September 01, 1955 Referring Provider (PT): Rema Jasmine, MD   Encounter Date: 05/13/2020   PT End of Session - 05/13/20 1635    Visit Number 16    Number of Visits 25    Date for PT Re-Evaluation 06/05/20    Authorization Type 6    Authorization Time Period of 10 progress report    PT Start Time 1635    PT Stop Time 1648    PT Time Calculation (min) 13 min    Activity Tolerance Patient tolerated treatment well    Behavior During Therapy [facility] for tasks assessed/performed           Past Medical History:  Diagnosis Date  . Diabetes mellitus without complication ([place])   . Diabetes type 2, uncontrolled ([place]) 11/15/2011  . Hyperlipidemia 11/15/2011  . Personal history of adenomatous colonic polyps 11/24/2011  . Smokeless tobacco use 10/11/2011    Past Surgical History:  Procedure Laterality Date  . ORIF ACETABULAR FRACTURE Left 1998   left  . TOTAL HIP ARTHROPLASTY Left 2021  . WISDOM TOOTH EXTRACTION      There were no vitals filed for this visit.   Subjective Assessment - 05/13/20 1636    Subjective L hip feels pretty good. Started back to work last Tuesday. Was a little sore but ok. Feels ready to graduate PT.    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 Had a [place] 2000. Had L hip reconstruction. L hip got to bone on bone and surgery was the only option. Used a crutch R side secondary to L hip pain prior to surgery. No falls within the last 6 months. Fear of falling limites activities, but does not prevent pt from leaving his home. Pt lives in a 1 story home with wife. 4 steps to enter front door with B rail, 5 steps back door, no rails.    Patient Stated Goals Walk normally.     Currently in Pain? No/denies                                     PT Education - 05/13/20 1700    Education Details progress with PT towards goals    Person(s) Educated Patient    Methods Explanation    Comprehension Verbalized understanding           Objective   Antalgic, decrease stance L LE with RW   No BP problems per pt.   No latex allergies   reviewed hip precautions with pt.   DOS: 03/10/2020    Next MD appointment 05/06/2020  Medbridge Access Code [email or web address]     Therapeutic exercise    Posterior hip precautions maintained:   Supine L hip flexion, abduction, extension A/AROM 1-2x each way  S/L hip abduction, seated hip flexion, seated hip extension 1-2x each way  Reviewed progress with PT towards goals  Improved exercise technique, movement at target joints, use of target muscles after min to mod verbal, visual, tactile cues.    Pt response/clinical impression: Pt demonstrates improved hip ROM, strength, function, and ability to ambulate independently. Pt has made very good progress with PT towards goals and demonstrates consistency  and independence with his HEP. Pt also recently returned to work. Skilled physical therapy services discharged with pt continuing progress with his exercises at home.     PT Short Term Goals - 04/17/20 1332      PT SHORT TERM GOAL #1   Title Pt will be independent with his initial HEP to improve ROM, strength, and ability to ambulate with less diffiulty.    Baseline Pt has started his HEP (03/13/2020); Does his HEP, no questions (04/17/2020)    Time 3    Period Weeks    Status Achieved    Target Date 04/03/20             PT Long Term Goals - 05/13/20 1638      PT LONG TERM GOAL #1   Title Patient will improve his L hip extension PROM to 0 degrees, flexion to 90 degrees, and abduction to 30 degrees to promote ability to perform standing tasks with less difficulty.     Baseline PROM L hip extension - 8 degrees, hip flexion 50 degrees, hip abduction 7 degrees (03/13/2020); L hip extension -1 degrees, hip flexion 80 degrees AAROM, hip abduction (supine) AAROM 17 degrees (04/17/2020); L hip extension (supine AROM) 1 degree, hip flexion (supine) 90 degrees AAROM, hip abduction 20 degrees AAROM (05/13/2020)    Time 12    Period Weeks    Status Partially Met    Target Date 06/05/20      PT LONG TERM GOAL #2   Title Patient will have at least 4/5 L hip abduction, flexion, and extension strength to promote ability to ambulate with less difficulty.    Baseline L hip strength not yet tested secondary to being only 3 days post op (03/13/2020); Hip abduction 4/5, seated hip flexion  4/5(04/17/2020); S/L hip abduction 4/5, seated hip flexion, 4/5, seated hip extension 4/5 (05/13/2020)    Time 12    Period Weeks    Status Achieved    Target Date 06/05/20      PT LONG TERM GOAL #3   Title Patient will be able to ambulate at least 500 ft without AD to promote mobility.    Baseline Pt currently ambulates with rw (03/13/2020); able to ambulate mod I with SPC (04/17/2020); independent ambulation greater than 500 ft, no longer using an AD (05/13/2020)    Time 12    Period Weeks    Status Achieved    Target Date 06/05/20      PT LONG TERM GOAL #4   Title Patient will improve his FOTO score by at least 10 points as a demonstration of improved function.    Baseline Hip FOTO 30 (03/13/2020); 58 (04/17/2020); 72 (05/13/2020)    Time 12    Period Weeks    Status Achieved    Target Date 06/05/20                 Plan - 05/13/20 1659    Clinical Impression Statement Pt demonstrates improved hip ROM, strength, function, and ability to ambulate independently. Pt has made very good progress with PT towards goals and demonstrates consistency and independence with his HEP. Pt also recently returned to work. Skilled physical therapy services discharged with pt continuing progress with  his exercises at home.    Personal Factors and Comorbidities Past/Current Experience;Comorbidity 1    Comorbidities DM    Examination-Activity Limitations Transfers;Bed Mobility;Squat;Stairs    Stability/Clinical Decision Making Stable/Uncomplicated    Clinical Decision Making Low    Rehab  Potential Good    PT Frequency 2x / week    PT Duration 12 weeks    PT Treatment/Interventions Therapeutic activities;Functional mobility training;Therapeutic exercise;Balance training;Neuromuscular re-education;Patient/family education;Manual techniques;Dry needling;Passive range of motion;Aquatic Therapy;Electrical Stimulation;Iontophoresis 7m/ml Dexamethasone;Gait training;Stair training    PT Next Visit Plan Maintain precautions. PROM. A/AROM, functional strengthening, manual techniques, modalities PRN    PT Home Exercise Plan Medbridge Access Code [email or web address]    Consulted and Agree with Plan of Care Patient           Patient will benefit from skilled therapeutic intervention in order to improve the following deficits and impairments:  Pain,Postural dysfunction,Improper body mechanics,Abnormal gait,Decreased range of motion,Decreased strength,Difficulty walking,Increased fascial restricitons  Visit Diagnosis: Pain in left hip  Difficulty in walking, not elsewhere classified  Muscle weakness (generalized)     Problem List Patient Active Problem List   Diagnosis Date Noted  . Status post total replacement of left hip 03/10/2020  . Post-traumatic osteoarthritis of left hip 06/26/2015  . Personal history of adenomatous colonic polyps 11/24/2011  . Controlled type 2 diabetes mellitus without complication, without long-term current use of insulin ([place] 11/15/2011  . Hyperlipidemia LDL goal <70 11/15/2011  . Smokeless tobacco use 10/11/2011  . Obesity 10/11/2011    MJoneen BoersPT, DPT   05/13/2020, 5:08 PM  [facility] [address] [place] [address]Phone: [phone]  Fax:  [phone] Name: Micheal MEGGETTMRN: [id number] of Birth: [birth year]

## 2020-05-15 ENCOUNTER — Ambulatory Visit: Payer: 59

## 2020-07-15 ENCOUNTER — Encounter: Payer: Self-pay | Admitting: Internal Medicine

## 2020-09-30 ENCOUNTER — Other Ambulatory Visit: Payer: Self-pay | Admitting: Family Medicine

## 2020-09-30 ENCOUNTER — Telehealth: Payer: Self-pay | Admitting: Family Medicine

## 2020-09-30 ENCOUNTER — Other Ambulatory Visit (INDEPENDENT_AMBULATORY_CARE_PROVIDER_SITE_OTHER): Payer: 59

## 2020-09-30 ENCOUNTER — Other Ambulatory Visit: Payer: Self-pay

## 2020-09-30 DIAGNOSIS — E119 Type 2 diabetes mellitus without complications: Secondary | ICD-10-CM

## 2020-09-30 LAB — POCT GLYCOSYLATED HEMOGLOBIN (HGB A1C): Hemoglobin A1C: 12.2 % — AB (ref 4.0–5.6)

## 2020-09-30 NOTE — Telephone Encounter (Signed)
Lab appointment scheduled today at 1:15 pm. Future order in Epic.

## 2020-09-30 NOTE — Telephone Encounter (Signed)
This is the type of thing that always needs an appointment.  Please, no need to send to either Butch Penny or me - feel free to make an appointment when the patient calls.  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 And he stated that he could come today due to he is off, but he needs it before Friday"

## 2020-09-30 NOTE — Telephone Encounter (Signed)
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 And he stated that he could come today due to he is off, but he needs it before Friday

## 2020-10-01 ENCOUNTER — Telehealth: Payer: Self-pay | Admitting: Family Medicine

## 2020-10-01 NOTE — Telephone Encounter (Signed)
-----   Message from Owens Loffler, MD sent at 10/01/2020 10:41 AM EDT ----- Help him get an appointment.  Ok to wait based on when he can

## 2020-10-01 NOTE — Telephone Encounter (Signed)
Called patient no answer . Voice mail is full

## 2020-11-17 ENCOUNTER — Ambulatory Visit (INDEPENDENT_AMBULATORY_CARE_PROVIDER_SITE_OTHER): Payer: Self-pay | Admitting: Family Medicine

## 2020-11-17 ENCOUNTER — Other Ambulatory Visit: Payer: Self-pay

## 2020-11-17 ENCOUNTER — Encounter: Payer: Self-pay | Admitting: Family Medicine

## 2020-11-17 VITALS — BP 124/70 | HR 71 | Temp 98.1°F | Ht 67.5 in | Wt 231.4 lb

## 2020-11-17 DIAGNOSIS — E785 Hyperlipidemia, unspecified: Secondary | ICD-10-CM

## 2020-11-17 DIAGNOSIS — E119 Type 2 diabetes mellitus without complications: Secondary | ICD-10-CM

## 2020-11-17 LAB — POCT GLYCOSYLATED HEMOGLOBIN (HGB A1C): Hemoglobin A1C: 8.2 % — AB (ref 4.0–5.6)

## 2020-11-17 MED ORDER — PIOGLITAZONE HCL 45 MG PO TABS: 45.0000 mg | ORAL_TABLET | Freq: Every day | ORAL | 3 refills | Status: DC

## 2020-11-17 MED ORDER — METFORMIN HCL ER 500 MG PO TB24: ORAL_TABLET | ORAL | 3 refills | Status: DC

## 2020-11-17 NOTE — Patient Instructions (Signed)
App   "Pedometer"  Health app

## 2020-11-17 NOTE — Progress Notes (Signed)
D

## 2020-11-17 NOTE — Progress Notes (Signed)
[place] C[facility] T. Madalaine Portier, MD, [place] [facility] [place], [address]  Phone: [phone]  FAX: [phone]  Micheal Clark - 65 y.o. male  MRN [id number]  Date of Birth: 23-Jun-1955  Date: 11/17/2020  PCP: Owens Loffler, MD  Referral: Owens Loffler, MD  Chief Complaint  Patient presents with   Diabetes    This visit occurred during the SARS-CoV-2 public health emergency.  Safety protocols were in place, including screening questions prior to the visit, additional usage of staff PPE, and extensive cleaning of exam room while observing appropriate contact time as indicated for disinfecting solutions.   Subjective:   Micheal Clark is a 65 y.o. very pleasant male patient with Body mass index is 35.7 kg/m. who presents with the following:  A1c is at 8.2  Hit or miss with your diet Did not miss any meds at all in the last month  He is drinking about a gallon of sweet tea each week.  Diabetes Mellitus: Tolerating Medications: yes Compliance with diet: fair/poor, Body mass index is 35.7 kg/m. Exercise: minimal / intermittent Avg blood sugars at home: not checking Foot problems: none Hypoglycemia: none No nausea, vomitting, blurred vision, polyuria.  Lab Results  Component Value Date   HGBA1C 8.2 (A) 11/17/2020   HGBA1C 12.2 (A) 09/30/2020   HGBA1C 6.4 04/23/2020   Lab Results  Component Value Date   MICROALBUR 1.4 04/23/2020   LDLCALC 95 04/23/2020   CREATININE 0.85 04/23/2020    Wt Readings from Last 3 Encounters:  11/17/20 231 lb 6 oz (105 kg)  04/30/20 241 lb 4 oz (109.4 kg)  09/06/19 237 lb (107.5 kg)    Lipids: Doing well, stable. Tolerating meds fine with no SE. Panel reviewed with patient.  Lipids: Lab Results  Component Value Date   CHOL 169 04/23/2020   Lab Results  Component Value Date   HDL 45.50 04/23/2020   Lab Results  Component Value Date   LDLCALC 95 04/23/2020    Lab Results  Component Value Date   TRIG 145.0 04/23/2020   Lab Results  Component Value Date   CHOLHDL 4 04/23/2020    Lab Results  Component Value Date   ALT 10 04/23/2020   AST 12 04/23/2020   ALKPHOS 72 04/23/2020   BILITOT 0.5 04/23/2020     Review of Systems is noted in the HPI, as appropriate  Objective:   BP 124/70   Pulse 71   Temp 98.1 F (36.7 C) (Temporal)   Ht 5' 7.5" (1.715 m)   Wt 231 lb 6 oz (105 kg)   SpO2 97%   BMI 35.70 kg/m   GEN: No acute distress; alert,appropriate. PULM: Breathing comfortably in no respiratory distress PSYCH: Normally interactive.  CV: RRR, no m/g/r   Laboratory and Imaging Data: Results for orders placed or performed in visit on 11/17/20  POCT glycosylated hemoglobin (Hb A1C)  Result Value Ref Range   Hemoglobin A1C 8.2 (A) 4.0 - 5.6 %   HbA1c POC (<> result, manual entry)     HbA1c, POC (prediabetic range)     HbA1c, POC (controlled diabetic range)       Assessment and Plan:     ICD-10-CM   1. Controlled type 2 diabetes mellitus without complication, without long-term current use of insulin (HCC)  E11.9 POCT glycosylated hemoglobin (Hb A1C)     For Micheal Clark, his diabetes is reasonably stable with A1c at 8.  His  diet is really not going well, and he is missed some doses of medication in the last few months.    His blood pressures been okay, and recent check on lipids are okay.  Meds ordered this encounter  Medications   pioglitazone (ACTOS) 45 MG tablet    Sig: Take 1 tablet (45 mg total) by mouth daily.    Dispense:  90 tablet    Refill:  3   metFORMIN (GLUCOPHAGE-XR) 500 MG 24 hr tablet    Sig: TAKE FOUR TABLETS BY MOUTH EVERY MORNING WITH FOOD    Dispense:  360 tablet    Refill:  3   Medications Discontinued During This Encounter  Medication Reason   metFORMIN (GLUCOPHAGE-XR) 500 MG 24 hr tablet Reorder   pioglitazone (ACTOS) 45 MG tablet Reorder   Orders Placed This Encounter  Procedures   POCT  glycosylated hemoglobin (Hb A1C)    Follow-up: Return in about 6 months (around 05/18/2021) for for a complete physical.  Dragon Medical One speech-to-text software was used for transcription in this dictation.  Possible transcriptional errors can occur using Editor, commissioning.   Signed,  Maud Deed. Braedon Sjogren, MD   Outpatient Encounter Medications as of 11/17/2020  Medication Sig   aspirin 81 MG tablet Take 81 mg by mouth daily.   glipiZIDE (GLUCOTROL XL) 10 MG 24 hr tablet TAKE TWO TABLETS BY MOUTH EVERY MORNING WITH BREAKFAST   simvastatin (ZOCOR) 40 MG tablet TAKE ONE TABLET BY MOUTH AT BEDTIME   vitamin B-12 (CYANOCOBALAMIN) 1000 MCG tablet Take 1,000 mcg by mouth daily.    [DISCONTINUED] metFORMIN (GLUCOPHAGE-XR) 500 MG 24 hr tablet TAKE FOUR TABLETS BY MOUTH EVERY MORNING WITH FOOD   [DISCONTINUED] pioglitazone (ACTOS) 45 MG tablet TAKE ONE TABLET BY MOUTH DAILY   metFORMIN (GLUCOPHAGE-XR) 500 MG 24 hr tablet TAKE FOUR TABLETS BY MOUTH EVERY MORNING WITH FOOD   pioglitazone (ACTOS) 45 MG tablet Take 1 tablet (45 mg total) by mouth daily.   No facility-administered encounter medications on file as of 11/17/2020.

## 2020-11-17 NOTE — Progress Notes (Deleted)
poct

## 2021-01-14 ENCOUNTER — Other Ambulatory Visit: Payer: Self-pay

## 2021-01-14 ENCOUNTER — Encounter: Payer: Self-pay | Admitting: Family Medicine

## 2021-01-14 ENCOUNTER — Telehealth (INDEPENDENT_AMBULATORY_CARE_PROVIDER_SITE_OTHER): Payer: 59 | Admitting: Family Medicine

## 2021-01-14 VITALS — Ht 67.5 in

## 2021-01-14 DIAGNOSIS — U071 COVID-19: Secondary | ICD-10-CM

## 2021-01-14 MED ORDER — NIRMATRELVIR/RITONAVIR (PAXLOVID)TABLET: 3.0000 | ORAL_TABLET | Freq: Two times a day (BID) | ORAL | 0 refills | Status: AC

## 2021-01-14 NOTE — Progress Notes (Signed)
° ° ° ° °  Wallace Gappa T. Lekeith Wulf, MD Primary Care and [place] at [facility] [place], [address] Phone: [phone]   FAX: [phone]  Micheal Clark - 65 y.o. male   MRN [id number]   Date of Birth: 1955/07/12  Visit Date: 01/14/2021   PCP: Owens Loffler, MD   Referred by: Owens Loffler, MD  Virtual Visit via Video Note:  I connected with  Billyjoe Go Behan on 01/14/2021  9:20 AM EST by a video enabled telemedicine application and verified that I am speaking with the correct person using two identifiers.   Location patient: home computer, tablet, or smartphone Location provider: work or home office Consent: Verbal consent directly obtained from [employer]. Persons participating in the virtual visit: patient, provider  I discussed the limitations of evaluation and management by telemedicine and the availability of in person appointments. The patient expressed understanding and agreed to proceed.  Chief Complaint  Patient presents with   Covid Positive    Positive home test on Monday-Symptoms started last Friday   Sore Throat   Nasal Congestion    History of Present Illness:  Feeling bad this past weekend.  Thought maybe a cold.  Right now, feels like his head is stopped.  No fever.  ST. Not coughing all that bad.  No GI.  Globally, he does not feel so bad, he primarily is having some nasal congestion, is not really coughing so bad.  Review of Systems as above: See pertinent positives and pertinent negatives per HPI No acute distress verbally   Observations/Objective/Exam:  An attempt was made to discern vital signs over the phone and per patient if applicable and possible.   General:    Alert, Oriented, appears well and in no acute distress  Pulmonary:     On inspection no signs of respiratory distress.  Psych / Neurological:     Pleasant and cooperative.  Assessment and Plan:    ICD-10-CM   1.  COVID-19  U07.1      5 days into symptoms, multiple risk factors, i am gonna start him on Paxlovid.  We reviewed that he needs to not take Zocor today, and for the remaining 10 days.  Other supportive care as well.  We also reviewed isolation and quarantine.  I discussed the assessment and treatment plan with the patient. The patient was provided an opportunity to ask questions and all were answered. The patient agreed with the plan and demonstrated an understanding of the instructions.   The patient was advised to call back or seek an in-person evaluation if the symptoms worsen or if the condition fails to improve as anticipated.  Follow-up: prn unless noted otherwise below No follow-ups on file.  Meds ordered this encounter  Medications   nirmatrelvir/ritonavir EUA (PAXLOVID) 20 x 150 MG & 10 x 100MG  TABS    Sig: Take 3 tablets by mouth 2 (two) times daily for 5 days. (Take nirmatrelvir 150 mg two tablets twice daily for 5 days and ritonavir 100 mg one tablet twice daily for 5 days) Patient GFR is 90    Dispense:  30 tablet    Refill:  0   No orders of the defined types were placed in this encounter.   Signed,  Maud Deed. Rolen Conger, MD

## 2021-01-23 ENCOUNTER — Other Ambulatory Visit: Payer: Self-pay | Admitting: Family Medicine

## 2021-09-11 ENCOUNTER — Other Ambulatory Visit: Payer: Self-pay | Admitting: Family Medicine

## 2021-09-15 NOTE — Progress Notes (Unsigned)
    Kambrea Carrasco T. Dymond Spreen, MD, [place] at [facility] [place], [address]  Phone: [phone]  FAX: [phone]  Micheal Clark - 66 y.o. male  MRN [id number]  Date of Birth: 06/11/1955  Date: 09/16/2021  PCP: Owens Loffler, MD  Referral: Owens Loffler, MD  No chief complaint on file.  Subjective:   Micheal Clark is a 66 y.o. very pleasant male patient with There is no height or weight on file to calculate BMI. who presents with the following:  Very pleasant gentleman who I recall quite well, he presents today in follow-up for his type 2 diabetes, and this is most recently been fairly poorly controlled.  Diabetes Mellitus: Tolerating Medications: yes Metformin 2000 mg Actos 45 mg Glipizide 20 mg Compliance with diet: fair, There is no height or weight on file to calculate BMI. Exercise: minimal / intermittent Avg blood sugars at home: not checking Foot problems: none Hypoglycemia: none No nausea, vomitting, blurred vision, polyuria.  Lab Results  Component Value Date   HGBA1C 8.2 (A) 11/17/2020   HGBA1C 12.2 (A) 09/30/2020   HGBA1C 6.4 04/23/2020   Lab Results  Component Value Date   MICROALBUR 1.4 04/23/2020   Sanderson 95 04/23/2020   CREATININE 0.85 04/23/2020    Wt Readings from Last 3 Encounters:  11/17/20 231 lb 6 oz (105 kg)  04/30/20 241 lb 4 oz (109.4 kg)  09/06/19 237 lb (107.5 kg)     Review of Systems is noted in the HPI, as appropriate  Objective:   There were no vitals taken for this visit.  GEN: No acute distress; alert,appropriate. PULM: Breathing comfortably in no respiratory distress PSYCH: Normally interactive.   Laboratory and Imaging Data:  Assessment and Plan:   ***

## 2021-09-16 ENCOUNTER — Ambulatory Visit (INDEPENDENT_AMBULATORY_CARE_PROVIDER_SITE_OTHER): Payer: Self-pay | Admitting: Family Medicine

## 2021-09-16 ENCOUNTER — Encounter: Payer: Self-pay | Admitting: Family Medicine

## 2021-09-16 VITALS — BP 120/60 | HR 70 | Temp 99.0°F | Ht 67.5 in | Wt 238.4 lb

## 2021-09-16 DIAGNOSIS — E119 Type 2 diabetes mellitus without complications: Secondary | ICD-10-CM

## 2021-09-16 LAB — POCT GLYCOSYLATED HEMOGLOBIN (HGB A1C): Hemoglobin A1C: 7.1 % — AB (ref 4.0–5.6)

## 2021-09-16 MED ORDER — METFORMIN HCL ER 500 MG PO TB24: ORAL_TABLET | ORAL | 3 refills | Status: DC

## 2021-09-16 MED ORDER — GLIPIZIDE ER 10 MG PO TB24
20.0000 mg | ORAL_TABLET | Freq: Every day | ORAL | 3 refills | Status: DC
Start: 2021-09-16 — End: 2022-11-12

## 2021-09-16 MED ORDER — PIOGLITAZONE HCL 45 MG PO TABS
45.0000 mg | ORAL_TABLET | Freq: Every day | ORAL | 3 refills | Status: DC
Start: 2021-09-16 — End: 2023-09-07

## 2021-09-16 MED ORDER — SIMVASTATIN 40 MG PO TABS: 40.0000 mg | ORAL_TABLET | Freq: Every day | ORAL | 3 refills | Status: DC

## 2022-03-04 IMAGING — DX DG KNEE COMPLETE 4+V*L*
4 series · 4 of 4 positions shown · non-contrast
Comparison: None.

CLINICAL DATA: Evaluate osteoarthritis

EXAM:
LEFT KNEE - COMPLETE 4+ VIEW

[knee ap]
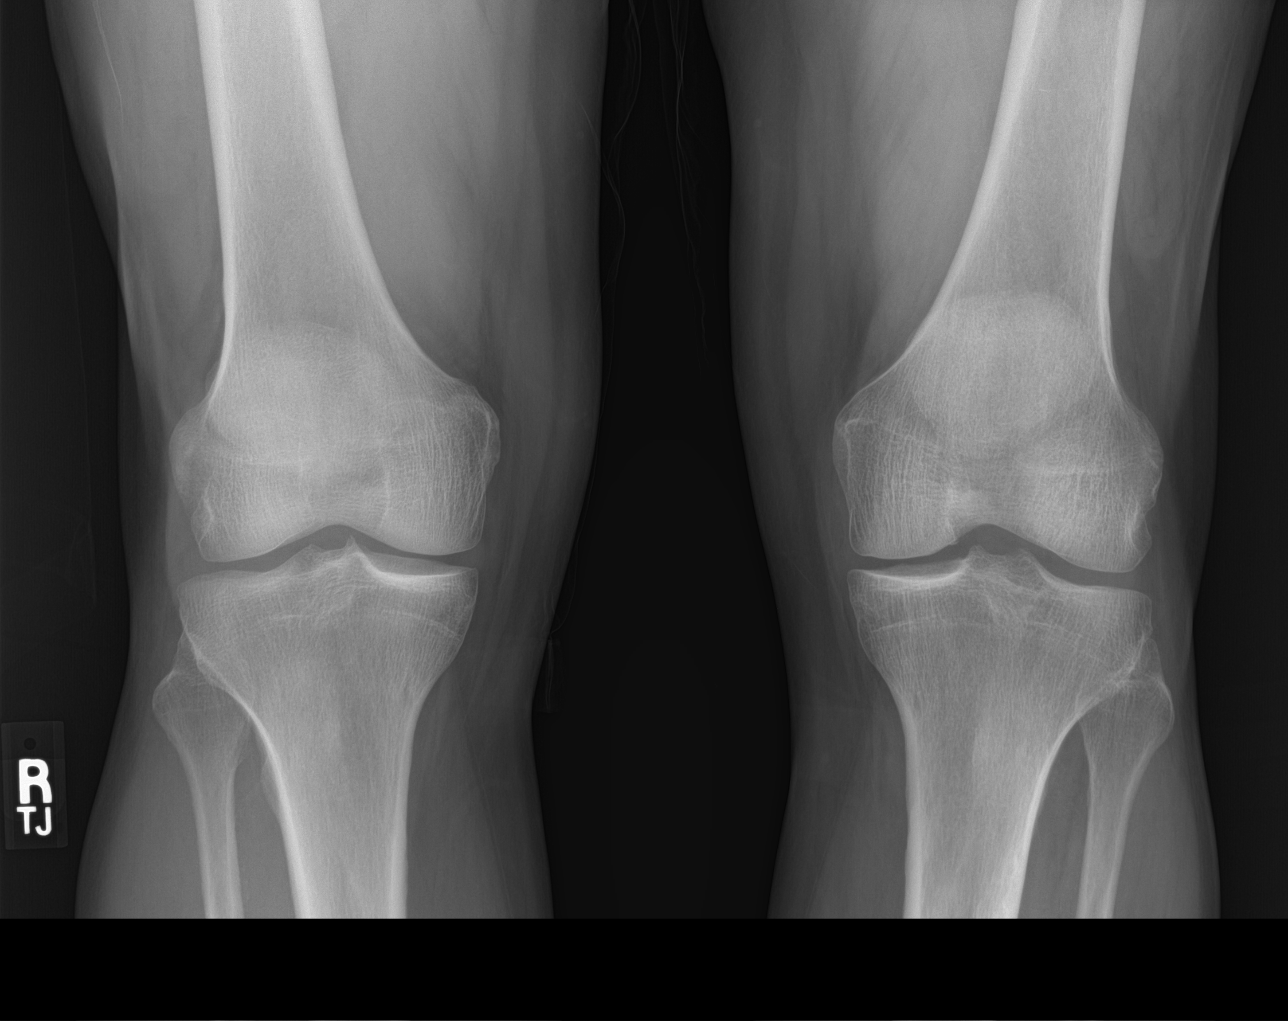

[knee tunnel]
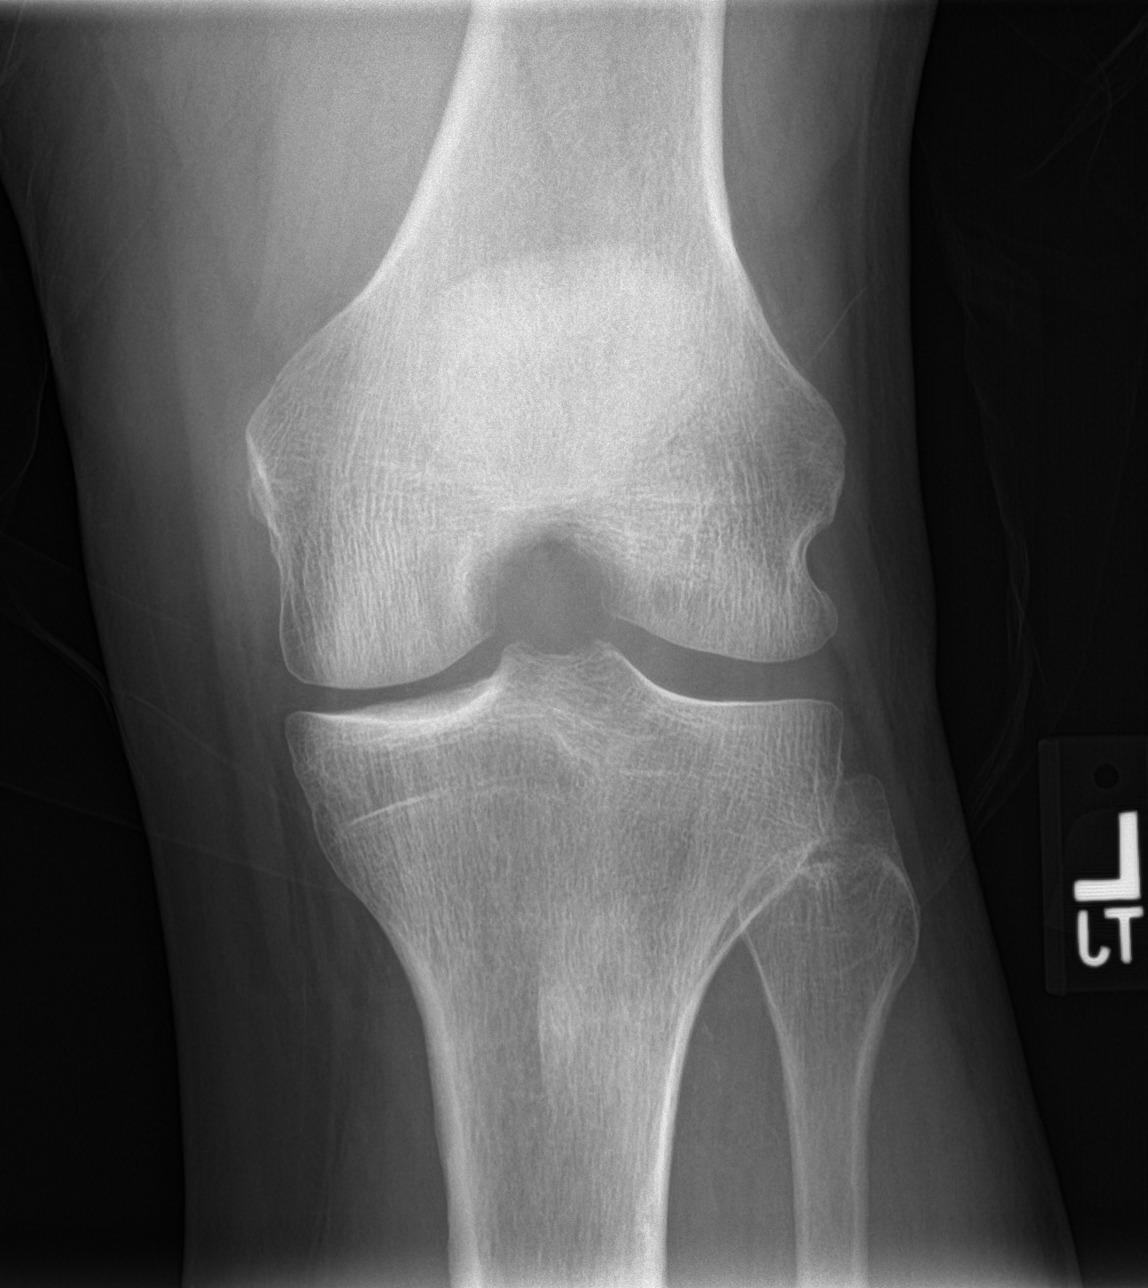

[knee lat]
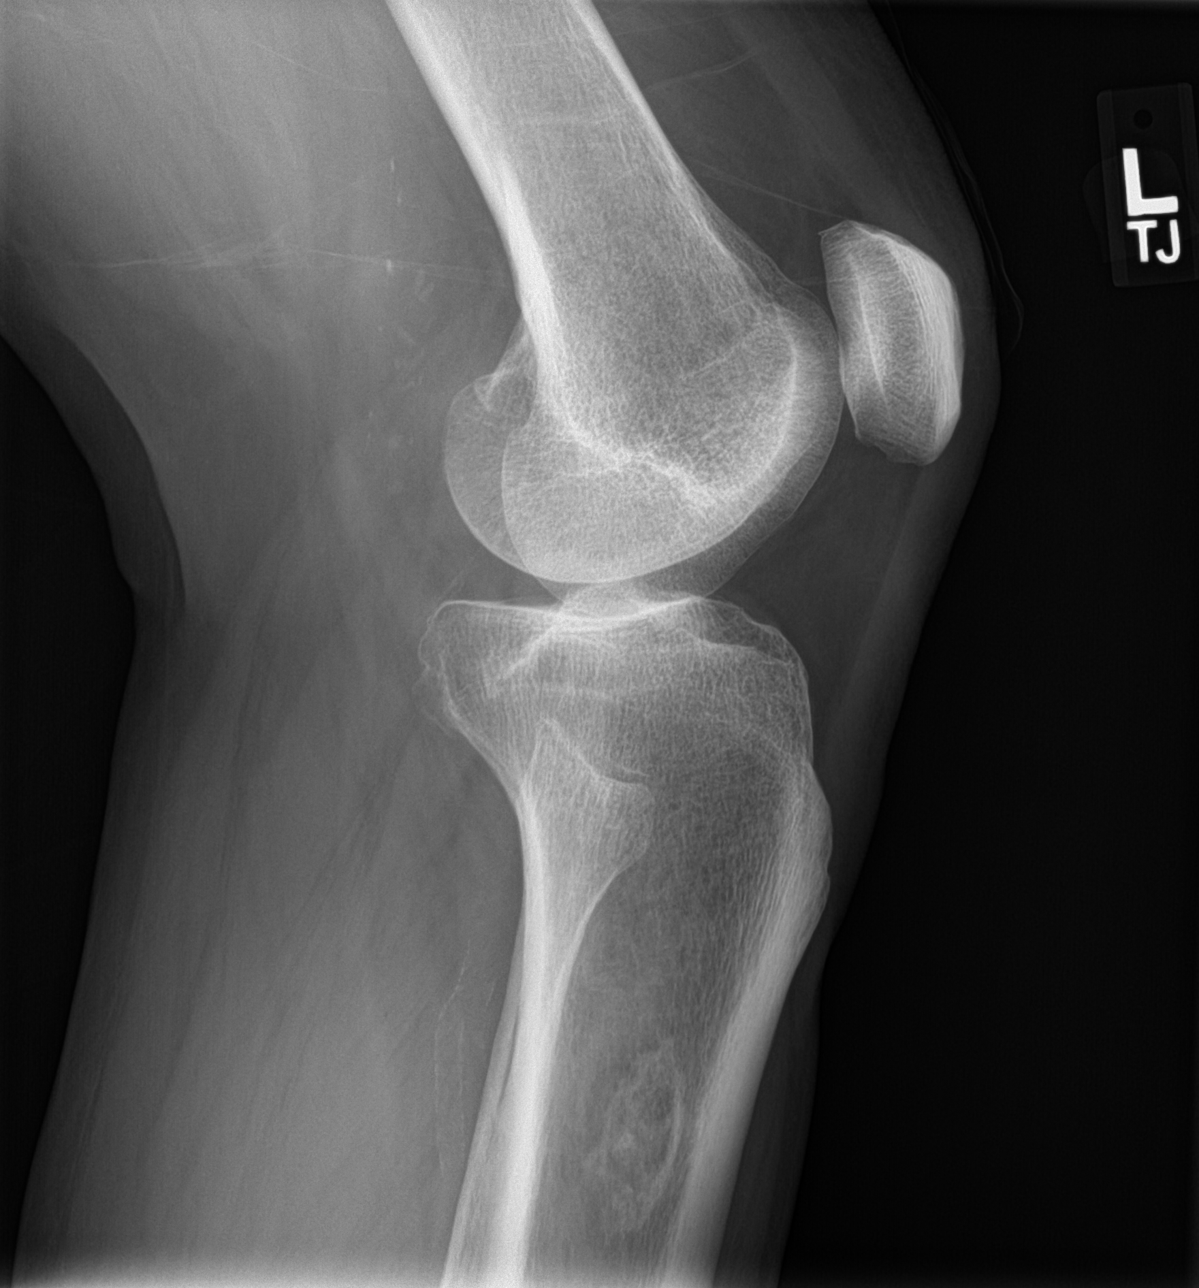

[patella skyline]
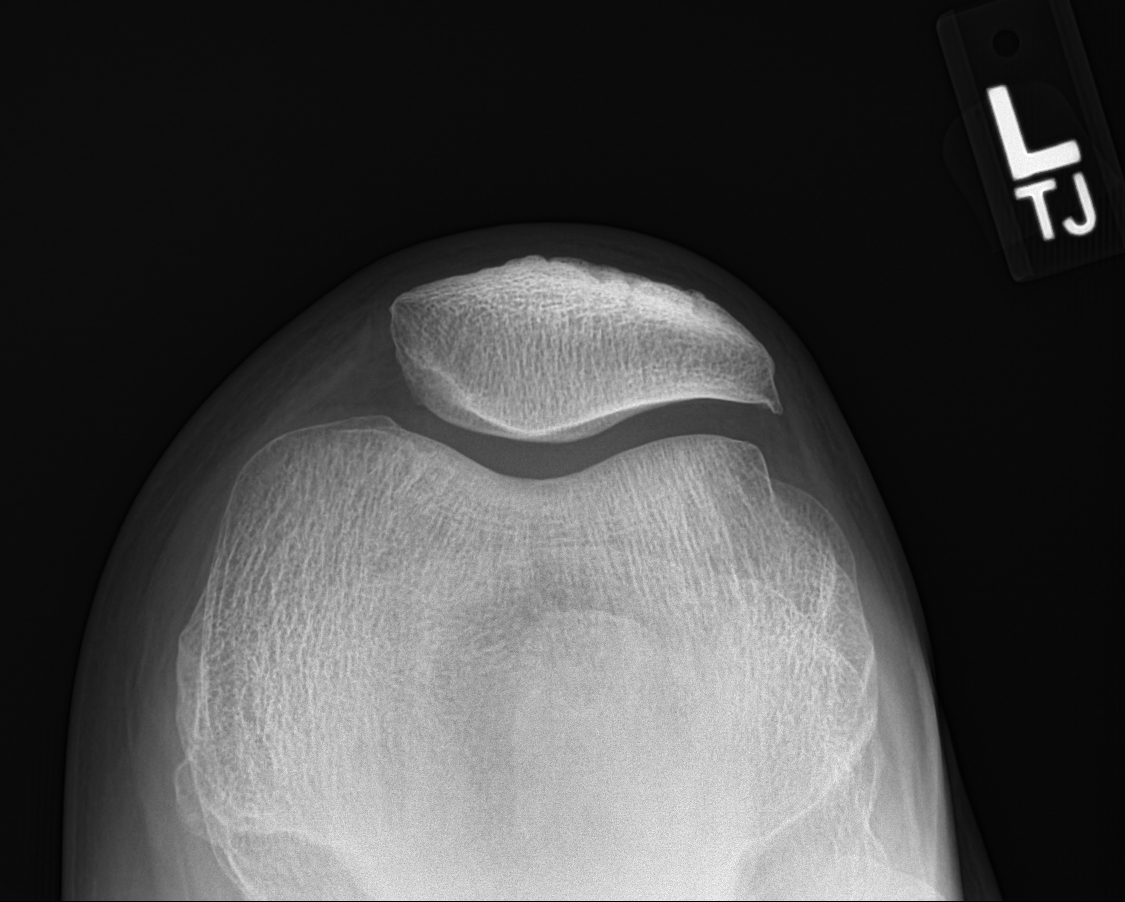

[4 of 4 positions shown; findings below may reference images not displayed]

FINDINGS: No evidence of fracture, dislocation, or joint effusion. No evidence
of arthropathy. Incidental benign cortical based fibro-osseous
lesion of the proximal left tibia. Vascular calcinosis.
IMPRESSION: No fracture or dislocation of the left knee. Joint spaces are
preserved.

## 2022-08-17 ENCOUNTER — Telehealth: Payer: Self-pay | Admitting: Family Medicine

## 2022-08-17 NOTE — Telephone Encounter (Signed)
Patient left a message requesting a call back whenever possible. Please advise patient's mobile number, thank you.

## 2022-08-18 NOTE — Telephone Encounter (Signed)
Noted  

## 2022-08-18 NOTE — Telephone Encounter (Addendum)
Spoke with Micheal Clark.  He states he has been experiencing some SOB and Fatigue.  He states if he get too hot he will actually throw up.  Appointment scheduled with Dr. Ermalene Searing 08/19/2022 at 12:00 pm.   ER precautions given.  FYI to Dr. Patsy Lager and Dr. Ermalene Searing.  Please advise if further instructions need to be given. I did see that Dr. Reece Agar had a 10:30 am opening today so I call [name] back to offer him that appointment but he states he wouldn't be able to make it by that time.

## 2022-08-19 ENCOUNTER — Ambulatory Visit (INDEPENDENT_AMBULATORY_CARE_PROVIDER_SITE_OTHER): Payer: Medicare Other | Admitting: Family Medicine

## 2022-08-19 ENCOUNTER — Encounter: Payer: Self-pay | Admitting: Family Medicine

## 2022-08-19 ENCOUNTER — Telehealth: Payer: Self-pay | Admitting: Family Medicine

## 2022-08-19 ENCOUNTER — Ambulatory Visit (INDEPENDENT_AMBULATORY_CARE_PROVIDER_SITE_OTHER)
Admission: RE | Admit: 2022-08-19 | Discharge: 2022-08-19 | Disposition: A | Discharge status: Home / Self Care | Payer: Medicare Other | Source: Ambulatory Visit | Attending: Family Medicine | Admitting: Family Medicine

## 2022-08-19 VITALS — BP 108/68 | HR 72 | Temp 98.5°F | Resp 16 | Ht 67.5 in | Wt 224.0 lb

## 2022-08-19 DIAGNOSIS — R0609 Other forms of dyspnea: Secondary | ICD-10-CM

## 2022-08-19 DIAGNOSIS — E785 Hyperlipidemia, unspecified: Secondary | ICD-10-CM

## 2022-08-19 DIAGNOSIS — Z7984 Long term (current) use of oral hypoglycemic drugs: Secondary | ICD-10-CM

## 2022-08-19 DIAGNOSIS — E119 Type 2 diabetes mellitus without complications: Secondary | ICD-10-CM

## 2022-08-19 DIAGNOSIS — Z599 Problem related to housing and economic circumstances, unspecified: Secondary | ICD-10-CM | POA: Diagnosis not present

## 2022-08-19 DIAGNOSIS — R0789 Other chest pain: Secondary | ICD-10-CM

## 2022-08-19 LAB — CBC WITH DIFFERENTIAL/PLATELET
Basophils Absolute: 0 10*3/uL (ref 0.0–0.1)
Basophils Relative: 0.3 % (ref 0.0–3.0)
Eosinophils Absolute: 0.1 10*3/uL (ref 0.0–0.7)
Eosinophils Relative: 1.3 % (ref 0.0–5.0)
HCT: 45.5 % (ref 39.0–52.0)
Hemoglobin: 14.8 g/dL (ref 13.0–17.0)
Lymphocytes Relative: 26.7 % (ref 12.0–46.0)
Lymphs Abs: 2.3 10*3/uL (ref 0.7–4.0)
MCHC: 32.5 g/dL (ref 30.0–36.0)
MCV: 92.7 fl (ref 78.0–100.0)
Monocytes Absolute: 0.5 10*3/uL (ref 0.1–1.0)
Monocytes Relative: 5.6 % (ref 3.0–12.0)
Neutro Abs: 5.7 10*3/uL (ref 1.4–7.7)
Neutrophils Relative %: 66.1 % (ref 43.0–77.0)
Platelets: 238 10*3/uL (ref 150.0–400.0)
RBC: 4.91 Mil/uL (ref 4.22–5.81)
RDW: 12.7 % (ref 11.5–15.5)
WBC: 8.6 10*3/uL (ref 4.0–10.5)

## 2022-08-19 LAB — HEPATIC FUNCTION PANEL
ALT: 20 U/L (ref 0–53)
AST: 14 U/L (ref 0–37)
Albumin: 4.2 g/dL (ref 3.5–5.2)
Alkaline Phosphatase: 84 U/L (ref 39–117)
Bilirubin, Direct: 0.1 mg/dL (ref 0.0–0.3)
Total Bilirubin: 0.6 mg/dL (ref 0.2–1.2)
Total Protein: 6.7 g/dL (ref 6.0–8.3)

## 2022-08-19 LAB — LIPID PANEL
Cholesterol: 196 mg/dL (ref 0–200)
HDL: 45.3 mg/dL (ref >39.00)
LDL Cholesterol: 118 mg/dL — ABNORMAL HIGH (ref 0–99)
NonHDL: 150.5
Total CHOL/HDL Ratio: 4
Triglycerides: 162 mg/dL — ABNORMAL HIGH (ref 0.0–149.0)
VLDL: 32.4 mg/dL (ref 0.0–40.0)

## 2022-08-19 LAB — BASIC METABOLIC PANEL
BUN: 11 mg/dL (ref 6–23)
CO2: 30 mEq/L (ref 19–32)
Calcium: 9.6 mg/dL (ref 8.4–10.5)
Chloride: 104 mEq/L (ref 96–112)
Creatinine, Ser: 0.81 mg/dL (ref 0.40–1.50)
GFR: 92.46 mL/min (ref >60.00)
Glucose, Bld: 333 mg/dL — ABNORMAL HIGH (ref 70–99)
Potassium: 5.1 mEq/L (ref 3.5–5.1)
Sodium: 141 mEq/L (ref 135–145)

## 2022-08-19 LAB — POCT GLYCOSYLATED HEMOGLOBIN (HGB A1C): Hemoglobin A1C: 10.7 % — AB (ref 4.0–5.6)

## 2022-08-19 LAB — MICROALBUMIN / CREATININE URINE RATIO
Creatinine,U: 42.4 mg/dL
Microalb Creat Ratio: 1.7 mg/g (ref 0.0–30.0)
Microalb, Ur: <0.7 mg/dL (ref 0.0–1.9)

## 2022-08-19 LAB — TSH: TSH: 2.74 u[IU]/mL (ref 0.35–5.50)

## 2022-08-19 MED ORDER — OZEMPIC (0.25 OR 0.5 MG/DOSE) 2 MG/3ML ~~LOC~~ SOPN: 0.2500 mg | PEN_INJECTOR | SUBCUTANEOUS | 3 refills | Status: DC

## 2022-08-19 MED ORDER — SEMAGLUTIDE (1 MG/DOSE) 4 MG/3ML ~~LOC~~ SOPN: 1.0000 mg | PEN_INJECTOR | SUBCUTANEOUS | 3 refills | Status: DC

## 2022-08-19 MED ORDER — OZEMPIC (0.25 OR 0.5 MG/DOSE) 2 MG/3ML ~~LOC~~ SOPN: 0.5000 mg | PEN_INJECTOR | SUBCUTANEOUS | 3 refills | Status: DC

## 2022-08-19 NOTE — Telephone Encounter (Signed)
Patient called in and stated that the ozempic will cost him $1200 a month. He was wondering if there was something else he can try. Thank you!

## 2022-08-19 NOTE — Telephone Encounter (Signed)
Forgot to mention patient doesn't have prescription coverage under his medicare.

## 2022-08-19 NOTE — Patient Instructions (Signed)
Ozempic injectable medication for diabetes.   Start with 0.25 mg once a week for 4 weeks  Then increase to 0.5 mg once a week for 4 weeks  Then increase to 1 mg once a week

## 2022-08-19 NOTE — Progress Notes (Addendum)
Damari Suastegui T. Cecil Vandyke, MD, CAQ Sports Medicine [facility] at [facility] [address] [place], [address]  Phone: [phone]  FAX: [phone]  Micheal Clark - 67 y.o. male  MRN [id number]  Date of Birth: 23-Apr-1955  Date: 08/19/2022  PCP: Hannah Beat, MD  Referral: Hannah Beat, MD  Chief Complaint  Patient presents with   Shortness of Breath    Since the last few weeks. Gets worse when he gets hot, and vomits when to hot as well.   Fatigue    X couple of weeks   Subjective:   Micheal Clark is a 67 y.o. very pleasant male patient with Body mass index is 34.57 kg/m. who presents with the following:  F/u, not seen in a year.  Increased fatigue and SOB for the last couple of weeks.   Last couple of weeks, feels fatigued really easily. When he gets hot, has to catch his breath.  Has to get cool before he goes back to work.  He does not have a history of smoking or any kind of lung disease  New in the last few weeks, but nothing before then with having to stop.  The easy fatigability has been a new occurrence.  Taking all of his DM meds.  Very rarely misses a dose.  Drinking ok, but not eating quite as much.  During the day, cannot really eat much during the day.    No chest pain, but he does describe some substernal indigestion.  He thinks this mainly occurs after eating Has had some indigestion / chest pain in the mid chest.  Will get symptom after fish or pasta - will last 10-15 minutes.  Tums seems to help.   Non-smoker Dips only  A month and a half ago, felt pretty bad and coughing - covid test was normal.   Diabetes Mellitus: Tolerating Medications: yes Compliance with diet: fair, Body mass index is 34.57 kg/m. Exercise: minimal / intermittent Avg blood sugars at home: not checking Foot problems: none Hypoglycemia: none No nausea, vomitting, blurred vision, polyuria.  Lab Results  Component Value Date   HGBA1C  10.7 (A) 08/19/2022   HGBA1C 7.1 (A) 09/16/2021   HGBA1C 8.2 (A) 11/17/2020   Lab Results  Component Value Date   MICROALBUR <0.7 08/19/2022   LDLCALC 118 (H) 08/19/2022   CREATININE 0.81 08/19/2022    Wt Readings from Last 3 Encounters:  08/19/22 224 lb (101.6 kg)  09/16/21 238 lb 6 oz (108.1 kg)  11/17/20 231 lb 6 oz (105 kg)     Review of Systems is noted in the HPI, as appropriate  Patient Active Problem List   Diagnosis Date Noted   Diabetes mellitus treated with oral medication (HCC) 11/15/2011    Priority: High   Hyperlipidemia LDL goal <70 11/15/2011    Priority: Medium    Smokeless tobacco use 10/11/2011    Priority: Medium    Personal history of adenomatous colonic polyps 11/24/2011   Obesity 10/11/2011    Past Medical History:  Diagnosis Date   Diabetes type 2, uncontrolled 11/15/2011   Hyperlipidemia 11/15/2011   Personal history of adenomatous colonic polyps 11/24/2011   Smokeless tobacco use 10/11/2011    Past Surgical History:  Procedure Laterality Date   ORIF ACETABULAR FRACTURE Left 1998   left   TOTAL HIP ARTHROPLASTY Left 2021   WISDOM TOOTH EXTRACTION      Family History  Problem Relation Age of Onset   Diabetes  Mother    Hypertension Brother    Vision loss Paternal Grandfather    Colon cancer Neg Hx    Rectal cancer Neg Hx    Stomach cancer Neg Hx     Social History   Social History Narrative   Not on file     Objective:   BP 108/68   Pulse 72   Temp 98.5 F (36.9 C)   Resp 16   Ht 5' 7.5" (1.715 m)   Wt 224 lb (101.6 kg)   SpO2 99%   BMI 34.57 kg/m   GEN: No acute distress; alert,appropriate. PULM: Breathing comfortably in no respiratory distress PSYCH: Normally interactive.  CV: RRR, no m/g/r  PULM: Normal respiratory rate, no accessory muscle use. No wheezes, crackles or rhonchi  ABD: S, NT, ND, + BS, No rebound, No HSM   Laboratory and Imaging Data: DG Chest 2 View  Result Date: 08/19/2022 CLINICAL DATA:   Shortness of breath with exertion. EXAM: CHEST - 2 VIEW COMPARISON:  None Available. FINDINGS: The heart size and mediastinal contours are within normal limits. Both lungs are clear. The visualized skeletal structures are unremarkable. IMPRESSION: No active cardiopulmonary disease. Electronically Signed   By: Annia Belt M.D.   On: 08/19/2022 14:00     Results for orders placed or performed in visit on 08/19/22  Basic metabolic panel  Result Value Ref Range   Sodium 141 135 - 145 mEq/L   Potassium 5.1 3.5 - 5.1 mEq/L   Chloride 104 96 - 112 mEq/L   CO2 30 19 - 32 mEq/L   Glucose, Bld 333 (H) 70 - 99 mg/dL   BUN 11 6 - 23 mg/dL   Creatinine, Ser 7.82 0.40 - 1.50 mg/dL   GFR 95.62 >13.08 mL/min   Calcium 9.6 8.4 - 10.5 mg/dL  CBC with Differential/Platelet  Result Value Ref Range   WBC 8.6 4.0 - 10.5 K/uL   RBC 4.91 4.22 - 5.81 Mil/uL   Hemoglobin 14.8 13.0 - 17.0 g/dL   HCT 65.7 84.6 - 96.2 %   MCV 92.7 78.0 - 100.0 fl   MCHC 32.5 30.0 - 36.0 g/dL   RDW 95.2 84.1 - 32.4 %   Platelets 238.0 150.0 - 400.0 K/uL   Neutrophils Relative % 66.1 43.0 - 77.0 %   Lymphocytes Relative 26.7 12.0 - 46.0 %   Monocytes Relative 5.6 3.0 - 12.0 %   Eosinophils Relative 1.3 0.0 - 5.0 %   Basophils Relative 0.3 0.0 - 3.0 %   Neutro Abs 5.7 1.4 - 7.7 K/uL   Lymphs Abs 2.3 0.7 - 4.0 K/uL   Monocytes Absolute 0.5 0.1 - 1.0 K/uL   Eosinophils Absolute 0.1 0.0 - 0.7 K/uL   Basophils Absolute 0.0 0.0 - 0.1 K/uL  Hepatic function panel  Result Value Ref Range   Total Bilirubin 0.6 0.2 - 1.2 mg/dL   Bilirubin, Direct 0.1 0.0 - 0.3 mg/dL   Alkaline Phosphatase 84 39 - 117 U/L   AST 14 0 - 37 U/L   ALT 20 0 - 53 U/L   Total Protein 6.7 6.0 - 8.3 g/dL   Albumin 4.2 3.5 - 5.2 g/dL  Lipid panel  Result Value Ref Range   Cholesterol 196 0 - 200 mg/dL   Triglycerides 401.0 (H) 0.0 - 149.0 mg/dL   HDL 27.25 >36.64 mg/dL   VLDL 40.3 0.0 - 47.4 mg/dL   LDL Cholesterol 259 (H) 0 - 99 mg/dL   Total  CHOL/HDL Ratio  4    NonHDL 150.50   D-dimer, quantitative  Result Value Ref Range   D-Dimer, Quant 0.26 <0.50 mcg/mL FEU  TSH  Result Value Ref Range   TSH 2.74 0.35 - 5.50 uIU/mL  Microalbumin / creatinine urine ratio  Result Value Ref Range   Microalb, Ur <0.7 0.0 - 1.9 mg/dL   Creatinine,U 16.1 mg/dL   Microalb Creat Ratio 1.7 0.0 - 30.0 mg/g  POCT glycosylated hemoglobin (Hb A1C)  Result Value Ref Range   Hemoglobin A1C 10.7 (A) 4.0 - 5.6 %   HbA1c POC (<> result, manual entry)     HbA1c, POC (prediabetic range)     HbA1c, POC (controlled diabetic range)      DG Chest 2 View  Result Date: 08/19/2022 CLINICAL DATA:  Shortness of breath with exertion. EXAM: CHEST - 2 VIEW COMPARISON:  None Available. FINDINGS: The heart size and mediastinal contours are within normal limits. Both lungs are clear. The visualized skeletal structures are unremarkable. IMPRESSION: No active cardiopulmonary disease. Electronically Signed   By: Annia Belt M.D.   On: 08/19/2022 14:00     Assessment and Plan:     ICD-10-CM   1. Dyspnea on exertion  R06.09 Basic metabolic panel    CBC with Differential/Platelet    Hepatic function panel    D-dimer, quantitative    TSH    DG Chest 2 View    Ambulatory referral to Cardiology    2. Controlled type 2 diabetes mellitus without complication, without long-term current use of insulin (HCC)  E11.9 POCT glycosylated hemoglobin (Hb A1C)    Semaglutide,0.25 or 0.5MG /DOS, (OZEMPIC, 0.25 OR 0.5 MG/DOSE,) 2 MG/3ML SOPN    Semaglutide,0.25 or 0.5MG /DOS, (OZEMPIC, 0.25 OR 0.5 MG/DOSE,) 2 MG/3ML SOPN    Semaglutide, 1 MG/DOSE, 4 MG/3ML SOPN    Microalbumin / creatinine urine ratio    AMB Referral to Pharmacy Medication Management    Ambulatory referral to Cardiology    3. Hyperlipidemia LDL goal <70  E78.5 Lipid panel    Ambulatory referral to Cardiology    4. Diabetes mellitus treated with oral medication (HCC)  E11.9 Ambulatory referral to Cardiology    Z79.84     5. Financial difficulties  Z59.9 AMB Referral to Pharmacy Medication Management    6. Chest pressure  R07.89 Ambulatory referral to Cardiology     New onset dyspnea and dyspnea on exertion.  This is particular worsened in the last few weeks.  Check a plain chest x-ray, this is returned and is normal.  Check all baseline laboratories including D-dimer.  Findings may dictate follow-up workup.  Does have multiple cardiac risk factors including hyperlipidemia, hypertension, poorly controlled diabetes, obesity, and long-term smokeless tobacco use. -He did have a normal nuclear stress test in 2017.  Poorly controlled diabetes.  Add Ozempic and titrate up. - After the office visit, Aldon contacted me, and he would be responsible for 1200 dollars a month for Ozempic.  I am going to contact Pharmacy assistance to see if he will qualify for company assistance for one of the GLP-1's.    Addendum: 08/23/22 10:48 AM  All labs have returned and are reassuring. With worsening SOB, SOB with exertion, and substernal intermittent chest pains described as indigestion, I think that he needs further cardiac evaluation.  I will refer to [facility] for assistance.   Medication Management during today's office visit: Meds ordered this encounter  Medications   Semaglutide,0.25 or 0.5MG /DOS, (OZEMPIC, 0.25 OR 0.5 MG/DOSE,) 2 MG/3ML SOPN  Sig: Inject 0.25 mg into the skin once a week.    Dispense:  3 mL    Refill:  3   Semaglutide,0.25 or 0.5MG /DOS, (OZEMPIC, 0.25 OR 0.5 MG/DOSE,) 2 MG/3ML SOPN    Sig: Inject 0.5 mg into the skin once a week. 2nd month after refill    Dispense:  3 mL    Refill:  3   Semaglutide, 1 MG/DOSE, 4 MG/3ML SOPN    Sig: Inject 1 mg into the skin once a week. 3rd month    Dispense:  3 mL    Refill:  3   There are no discontinued medications.  Orders placed today for conditions managed today: Orders Placed This Encounter  Procedures   DG Chest 2 View    Basic metabolic panel   CBC with Differential/Platelet   Hepatic function panel   Lipid panel   D-dimer, quantitative   TSH   Microalbumin / creatinine urine ratio   AMB Referral to Pharmacy Medication Management   Ambulatory referral to Cardiology   POCT glycosylated hemoglobin (Hb A1C)    Disposition: No follow-ups on file.  Dragon [employer] One speech-to-text software was used for transcription in this dictation.  Possible transcriptional errors can occur using Animal nutritionist.   Signed,  Elpidio Galea. Amylah Will, MD   Outpatient Encounter Medications as of 08/19/2022  Medication Sig   aspirin 81 MG tablet Take 81 mg by mouth daily.   glipiZIDE (GLUCOTROL XL) 10 MG 24 hr tablet Take 2 tablets (20 mg total) by mouth at bedtime.   metFORMIN (GLUCOPHAGE-XR) 500 MG 24 hr tablet TAKE FOUR TABLETS BY MOUTH EVERY MORNING WITH FOOD   pioglitazone (ACTOS) 45 MG tablet Take 1 tablet (45 mg total) by mouth daily.   Semaglutide, 1 MG/DOSE, 4 MG/3ML SOPN Inject 1 mg into the skin once a week. 3rd month   Semaglutide,0.25 or 0.5MG /DOS, (OZEMPIC, 0.25 OR 0.5 MG/DOSE,) 2 MG/3ML SOPN Inject 0.25 mg into the skin once a week.   Semaglutide,0.25 or 0.5MG /DOS, (OZEMPIC, 0.25 OR 0.5 MG/DOSE,) 2 MG/3ML SOPN Inject 0.5 mg into the skin once a week. 2nd month after refill   simvastatin (ZOCOR) 40 MG tablet Take 1 tablet (40 mg total) by mouth at bedtime.   vitamin B-12 (CYANOCOBALAMIN) 1000 MCG tablet Take 1,000 mcg by mouth daily.    No facility-administered encounter medications on file as of 08/19/2022.

## 2022-08-19 NOTE — Telephone Encounter (Signed)
Does he have any insurance coverage from Medicare for his medications?

## 2022-08-20 NOTE — Addendum Note (Signed)
Addended by: Hannah Beat on: 08/20/2022 08:55 AM   Modules accepted: Orders

## 2022-08-23 NOTE — Addendum Note (Signed)
Addended by: Hannah Beat on: 08/23/2022 10:48 AM   Modules accepted: Orders

## 2022-08-23 NOTE — Telephone Encounter (Signed)
Can you call Micheal Clark?  I have asked our clinic pharmacy to work with him to get patient assistance for his Ozempic.  The companies have programs that can help him get the medication for little to no cost.   With his new shortness of breath, I think that we should get him in to have a Cardiology consult, OK?

## 2022-08-23 NOTE — Telephone Encounter (Signed)
Casimiro Needle notified as instructed by telephone.  He is agreeable to Cardiology referral.  Prefers [place] location.

## 2022-08-23 NOTE — Telephone Encounter (Signed)
OK.  Cardiology consult made.

## 2022-08-26 ENCOUNTER — Ambulatory Visit: Payer: Medicare Other | Admitting: Cardiology

## 2022-08-26 ENCOUNTER — Encounter: Payer: Self-pay | Admitting: Cardiology

## 2022-08-26 VITALS — BP 130/88 | HR 100 | Ht 68.0 in | Wt 222.0 lb

## 2022-08-26 DIAGNOSIS — I2089 Other forms of angina pectoris: Secondary | ICD-10-CM | POA: Diagnosis present

## 2022-08-26 DIAGNOSIS — R0609 Other forms of dyspnea: Secondary | ICD-10-CM | POA: Insufficient documentation

## 2022-08-26 DIAGNOSIS — E782 Mixed hyperlipidemia: Secondary | ICD-10-CM | POA: Insufficient documentation

## 2022-08-26 MED ORDER — METOPROLOL TARTRATE 100 MG PO TABS: 100.0000 mg | ORAL_TABLET | Freq: Once | ORAL | 0 refills | Status: DC

## 2022-08-26 MED ORDER — IVABRADINE HCL 5 MG PO TABS: 15.0000 mg | ORAL_TABLET | Freq: Once | ORAL | 0 refills | Status: AC

## 2022-08-26 MED ORDER — ATORVASTATIN CALCIUM 40 MG PO TABS: 40.0000 mg | ORAL_TABLET | Freq: Every day | ORAL | 3 refills | Status: DC

## 2022-08-26 NOTE — Patient Instructions (Signed)
Medication Instructions:   STOP Simvastatin  START Atorvastatin - Take one tablet ( 40mg ) by mouth daily.   *If you need a refill on your cardiac medications before your next appointment, please call your pharmacy*   Lab Work:  None Ordered  If you have labs (blood work) drawn today and your tests are completely normal, you will receive your results only by: MyChart Message (if you have MyChart) OR A paper copy in the mail If you have any lab test that is abnormal or we need to change your treatment, we will call you to review the results.   Testing/Procedures:  Your physician has requested that you have an echocardiogram. Echocardiography is a painless test that uses sound waves to create images of your heart. It provides your doctor with information about the size and shape of your heart and how well your heart's chambers and valves are working. This procedure takes approximately one hour. There are no restrictions for this procedure. Please do NOT wear cologne, perfume, aftershave, or lotions (deodorant is allowed). Please arrive 15 minutes prior to your appointment time.    Your cardiac CT will be scheduled at:  [facility] - Heart and Vascular entrance  [address] [place], [place] [address] [phone]  [facility], please arrive 15 mins early for check-in and test prep.  There is spacious parking and easy access to the radiology department from the [facility] Heart and Vascular entrance. Please enter here and check-in with the desk attendant.   Please follow these instructions carefully (unless otherwise directed):  An IV will be required for this test and Nitroglycerin will be given.  Hold all erectile dysfunction medications at least 3 days (72 hrs) prior to test. (Ie viagra, cialis, sildenafil, tadalafil, etc)   On the Night Before the Test: Be sure to Drink plenty of water. Do not consume any caffeinated/decaffeinated  beverages or chocolate 12 hours prior to your test. Do not take any antihistamines 12 hours prior to your test.  On the Day of the Test: Drink plenty of water until 1 hour prior to the test. Do not eat any food 1 hour prior to test. You may take your regular medications prior to the test.  Take metoprolol (Lopressor) two hours prior to test. Take Corlanor TWO hours prior to test      After the Test: Drink plenty of water. After receiving IV contrast, you may experience a mild flushed feeling. This is normal. On occasion, you may experience a mild rash up to 24 hours after the test. This is not dangerous. If this occurs, you can take Benadryl 25 mg and increase your fluid intake. If you experience trouble breathing, this can be serious. If it is severe call 911 IMMEDIATELY. If it is mild, please call our office. If you take any of these medications: Glipizide/Metformin, Avandament, Glucavance, please do not take 48 hours after completing test unless otherwise instructed.  We will call to schedule your test 2-4 weeks out understanding that some insurance companies will need an authorization prior to the service being performed.   For more information and frequently asked questions, please visit our website : [email or web address]  For non-scheduling related questions, please contact the cardiac imaging nurse navigator should you have any questions/concerns: Cardiac Imaging Nurse Navigators Direct Office Dial: [phone]   For scheduling needs, including cancellations and rescheduling, please call [place], [phone].   Follow-Up: At [facility], you and your health needs are our  priority.  As part of our continuing mission to provide you with exceptional heart care, we have created designated Provider Care Teams.  These Care Teams include your primary Cardiologist (physician) and Advanced Practice Providers (APPs -  Physician Assistants and Nurse Practitioners)  who all work together to provide you with the care you need, when you need it.  We recommend signing up for the patient portal called "MyChart".  Sign up information is provided on this After Visit Summary.  MyChart is used to connect with patients for Virtual Visits (Telemedicine).  Patients are able to view lab/test results, encounter notes, upcoming appointments, etc.  Non-urgent messages can be sent to your provider as well.   To learn more about what you can do with MyChart, go to [email or web address].    Your next appointment:    After Cardiac Testing  Provider:   You may see Debbe Odea, MD or one of the following Advanced Practice Providers on your designated Care Team:   Nicolasa Ducking, NP Eula Listen, PA-C Cadence Fransico Rendon, PA-C Charlsie Quest, NP

## 2022-08-26 NOTE — Progress Notes (Signed)
Cardiology Office Note:    Date:  08/26/2022   ID:  Micheal Clark, DOB [birth year], MRN [id number]  PCP:  Micheal Beat, MD    HeartCare Providers Cardiologist:  Micheal Odea, MD     Referring MD: Micheal Beat, MD   Chief Complaint  Patient presents with   New Patient (Initial Visit)    Ref by Dr. Patsy Clark for diabetes mellitus without complications. Medications reviewed by the patient verbally. Patient c/o chest indigestion and shortness of breath with over exertion.     History of Present Illness:    Micheal Clark is a 67 y.o. male with a hx of hyperlipidemia, diabetes, former smoker presenting with shortness of breath on exertion.  Patient states having shortness of breath ongoing over the past 3 to 4 weeks.  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  Attributes some symptoms to being in the heat.  He denies any history of heart disease.  Smokes sparingly over 40 years ago for about 5 years.  Denies any personal or family history of heart disease.  Compliant with simvastatin as prescribed.  Prior notes/studies Echo 2017 EF 60 to 65% Lexiscan Myoview 2017 low risk, no ischemia  Past Medical History:  Diagnosis Date   Diabetes type 2, uncontrolled 11/15/2011   Hyperlipidemia 11/15/2011   Personal history of adenomatous colonic polyps 11/24/2011   Smokeless tobacco use 10/11/2011    Past Surgical History:  Procedure Laterality Date   ORIF ACETABULAR FRACTURE Left 1998   left   TOTAL HIP ARTHROPLASTY Left 2021   WISDOM TOOTH EXTRACTION      Current Medications: Current Meds  Medication Sig   aspirin 81 MG tablet Take 81 mg by mouth daily.   atorvastatin (LIPITOR) 40 MG tablet Take 1 tablet (40 mg total) by mouth daily.   glipiZIDE (GLUCOTROL XL) 10 MG 24 hr tablet Take 2 tablets (20 mg total) by mouth at bedtime.   ivabradine (CORLANOR) 5 MG TABS tablet Take 3 tablets (15 mg total) by mouth once for  1 dose. TWO HOURS PRIOR TO CARDIAC CTA   metFORMIN (GLUCOPHAGE-XR) 500 MG 24 hr tablet TAKE FOUR TABLETS BY MOUTH EVERY MORNING WITH FOOD   metoprolol tartrate (LOPRESSOR) 100 MG tablet Take 1 tablet (100 mg total) by mouth once for 1 dose. TWO HOURS PRIOR TO CARDIAC CTA   pioglitazone (ACTOS) 45 MG tablet Take 1 tablet (45 mg total) by mouth daily.   vitamin B-12 (CYANOCOBALAMIN) 1000 MCG tablet Take 1,000 mcg by mouth daily.    [DISCONTINUED] simvastatin (ZOCOR) 40 MG tablet Take 1 tablet (40 mg total) by mouth at bedtime.     Allergies:   Patient has no known allergies.   Social History   Socioeconomic History   Marital status: Married    Spouse name: Not on file   Number of children: 2   Years of education: Not on file   Highest education level: Not on file  Occupational History   Occupation: [employer]    Employer: [employer]     Comment: truck driver  Tobacco Use   Smoking status: Former    Current packs/day: 0.00    Average packs/day: 1 pack/day for 8.0 years (8.0 ttl pk-yrs)    Types: Cigarettes    Start date: 03/08/1972    Quit date: 03/08/1980    Years since quitting: 42.4   Smokeless tobacco: Current    Types: Chew   Tobacco comments:  quit 1993  Vaping Use   Vaping status: Never Used  Substance and Sexual Activity   Alcohol use: Yes    Comment: occassionally-not wekly   Drug use: No   Sexual activity: Not on file  Other Topics Concern   Not on file  Social History Narrative   Not on file   Social Determinants of Health   Financial Resource Strain: Low Risk  (03/10/2020)   Received from [facility], [facility]   Overall Financial Resource Strain (CARDIA)    Difficulty of Paying Living Expenses: Not very hard  Food Insecurity: No Food Insecurity (03/07/2020)   Received from [facility], [facility]   Hunger Vital Sign    Worried About Running Out of Food in the Last Year: Never true    Ran Out of Food in the Last Year: Never true   Transportation Needs: Not on file  Physical Activity: Not on file  Stress: No Stress Concern Present (03/10/2020)   Received from [facility], [facility]   [employer] of Occupational [facility] - Occupational Stress Questionnaire    Feeling of Stress : Only a little  Social Connections: Unknown (06/08/2021)   Received from [facility], [facility]   Social Network    Social Network: Not on file     Family History: The patient's family history includes Diabetes in his mother; Hypertension in his brother; Vision loss in his paternal grandfather. There is no history of Colon cancer, Rectal cancer, or Stomach cancer.  ROS:   Please see the history of present illness.     All other systems reviewed and are negative.  EKGs/Labs/Other Studies Reviewed:    The following studies were reviewed today:  EKG Interpretation Date/Time:  Thursday August 26 2022 11:02:12 EDT Ventricular Rate:  100 PR Interval:  188 QRS Duration:  132 QT Interval:  378 QTC Calculation: 487 R Axis:   -53  Text Interpretation: Normal sinus rhythm Right bundle branch block Left anterior fascicular block Bifascicular block Confirmed by Micheal Clark ([id number]) on 08/26/2022 11:11:33 AM    Recent Labs: 08/19/2022: ALT 20; BUN 11; Creatinine, Ser 0.81; Hemoglobin 14.8; Platelets 238.0; Potassium 5.1; Sodium 141; TSH 2.74  Recent Lipid Panel    Component Value Date/Time   CHOL 196 08/19/2022 1252   TRIG 162.0 (H) 08/19/2022 1252   HDL 45.30 08/19/2022 1252   CHOLHDL 4 08/19/2022 1252   VLDL 32.4 08/19/2022 1252   LDLCALC 118 (H) 08/19/2022 1252   LDLCALC 110 (H) 09/29/2018 1321   LDLDIRECT 162.9 10/11/2011 1445     Risk Assessment/Calculations:             Physical Exam:    VS:  BP 130/88 (BP Location: Left Arm, Patient Position: Sitting, Cuff Size: Normal)   Pulse 100   Ht 5\' 8"  (1.727 m)   Wt 222 lb (100.7 kg)   SpO2 97%   BMI 33.75 kg/m     Wt Readings from Last 3 Encounters:   08/26/22 222 lb (100.7 kg)  08/19/22 224 lb (101.6 kg)  09/16/21 238 lb 6 oz (108.1 kg)     GEN:  Well nourished, well developed in no acute distress HEENT: Normal NECK: No JVD; No carotid bruits CARDIAC: RRR, no murmurs, rubs, gallops RESPIRATORY:  Clear to auscultation without rales, wheezing or rhonchi  ABDOMEN: Soft, non-tender, non-distended MUSCULOSKELETAL:  No edema; No deformity  SKIN: Warm and dry NEUROLOGIC:  Alert and oriented x 3 PSYCHIATRIC:  Normal affect   ASSESSMENT:  1. DOE (dyspnea on exertion)   2. Mixed hyperlipidemia   3. Anginal equivalent    PLAN:    In order of problems listed above:  Dyspnea on exertion, this could be an anginal equivalent.  Risk factors diabetes, hyperlipidemia.  Get echocardiogram, get coronary CTA. Hyperlipidemia, cholesterol not controlled on simvastatin.  Start atorvastatin 40 mg daily.  Follow-up after cardiac testing.     Medication Adjustments/Labs and Tests Ordered: Current medicines are reviewed at length with the patient today.  Concerns regarding medicines are outlined above.  Orders Placed This Encounter  Procedures   CT CORONARY MORPH W/CTA COR W/SCORE W/CA W/CM &/OR WO/CM   EKG 12-Lead   EKG 12-Lead   ECHOCARDIOGRAM COMPLETE   Meds ordered this encounter  Medications   atorvastatin (LIPITOR) 40 MG tablet    Sig: Take 1 tablet (40 mg total) by mouth daily.    Dispense:  90 tablet    Refill:  3   metoprolol tartrate (LOPRESSOR) 100 MG tablet    Sig: Take 1 tablet (100 mg total) by mouth once for 1 dose. TWO HOURS PRIOR TO CARDIAC CTA    Dispense:  1 tablet    Refill:  0   ivabradine (CORLANOR) 5 MG TABS tablet    Sig: Take 3 tablets (15 mg total) by mouth once for 1 dose. TWO HOURS PRIOR TO CARDIAC CTA    Dispense:  3 tablet    Refill:  0    CASH PAY PLEASE    Patient Instructions  Medication Instructions:   STOP Simvastatin  START Atorvastatin - Take one tablet ( 40mg ) by mouth daily.   *If you  need a refill on your cardiac medications before your next appointment, please call your pharmacy*   Lab Work:  None Ordered  If you have labs (blood work) drawn today and your tests are completely normal, you will receive your results only by: MyChart Message (if you have MyChart) OR A paper copy in the mail If you have any lab test that is abnormal or we need to change your treatment, we will call you to review the results.   Testing/Procedures:  Your physician has requested that you have an echocardiogram. Echocardiography is a painless test that uses sound waves to create images of your heart. It provides your doctor with information about the size and shape of your heart and how well your heart's chambers and valves are working. This procedure takes approximately one hour. There are no restrictions for this procedure. Please do NOT wear cologne, perfume, aftershave, or lotions (deodorant is allowed). Please arrive 15 minutes prior to your appointment time.    Your cardiac CT will be scheduled at:  [facility] - Heart and Vascular entrance  [address] [place], [place] [address] [phone]  [facility], please arrive 15 mins early for check-in and test prep.  There is spacious parking and easy access to the radiology department from [facility]. Please enter here and check-in with the desk attendant.   Please follow these instructions carefully (unless otherwise directed):  An IV will be required for this test and Nitroglycerin will be given.  Hold all erectile dysfunction medications at least 3 days (72 hrs) prior to test. (Ie viagra, cialis, sildenafil, tadalafil, etc)   On the Night Before the Test: Be sure to Drink plenty of water. Do not consume any caffeinated/decaffeinated beverages or chocolate 12 hours prior to your test. Do  not take any antihistamines 12 hours prior to your test.  On  the Day of the Test: Drink plenty of water until 1 hour prior to the test. Do not eat any food 1 hour prior to test. You may take your regular medications prior to the test.  Take metoprolol (Lopressor) two hours prior to test. Take Corlanor TWO hours prior to test      After the Test: Drink plenty of water. After receiving IV contrast, you may experience a mild flushed feeling. This is normal. On occasion, you may experience a mild rash up to 24 hours after the test. This is not dangerous. If this occurs, you can take Benadryl 25 mg and increase your fluid intake. If you experience trouble breathing, this can be serious. If it is severe call 911 IMMEDIATELY. If it is mild, please call our office. If you take any of these medications: Glipizide/Metformin, Avandament, Glucavance, please do not take 48 hours after completing test unless otherwise instructed.  We will call to schedule your test 2-4 weeks out understanding that some insurance companies will need an authorization prior to the service being performed.   For more information and frequently asked questions, please visit our website : [email or web address]  For non-scheduling related questions, please contact the cardiac imaging nurse navigator should you have any questions/concerns: Cardiac Imaging Nurse Navigators Direct Office Dial: [phone]   For scheduling needs, including cancellations and rescheduling, please call [place], [phone].   Follow-Up: At [facility], you and your health needs are our priority.  As part of our continuing mission to provide you with exceptional heart care, we have created designated Provider Care Teams.  These Care Teams include your primary Cardiologist (physician) and Advanced Practice Providers (APPs -  Physician Assistants and Nurse Practitioners) who all work together to provide you with the care you need, when you need it.  We recommend signing up for the patient  portal called "MyChart".  Sign up information is provided on this After Visit Summary.  MyChart is used to connect with patients for Virtual Visits (Telemedicine).  Patients are able to view lab/test results, encounter notes, upcoming appointments, etc.  Non-urgent messages can be sent to your provider as well.   To learn more about what you can do with MyChart, go to [email or web address].    Your next appointment:    After Cardiac Testing  Provider:   You may see Micheal Odea, MD or one of the following Advanced Practice Providers on your designated Care Team:   Nicolasa Ducking, NP Eula Listen, PA-C Cadence Fransico Brayten, PA-C Charlsie Quest, NP   Signed, Micheal Odea, MD  08/26/2022 12:01 PM    [facility]

## 2022-08-30 ENCOUNTER — Other Ambulatory Visit: Payer: Self-pay

## 2022-08-30 ENCOUNTER — Other Ambulatory Visit: Payer: Medicare Other | Admitting: Pharmacist

## 2022-08-30 DIAGNOSIS — E119 Type 2 diabetes mellitus without complications: Secondary | ICD-10-CM

## 2022-08-30 MED ORDER — OZEMPIC (0.25 OR 0.5 MG/DOSE) 2 MG/3ML ~~LOC~~ SOPN
0.5000 mg | PEN_INJECTOR | SUBCUTANEOUS | 3 refills | Status: DC
Start: 2022-08-30 — End: 2022-11-02
  Filled 2022-08-30: qty 3, fill #0

## 2022-08-30 MED ORDER — SEMAGLUTIDE (1 MG/DOSE) 4 MG/3ML ~~LOC~~ SOPN
1.0000 mg | PEN_INJECTOR | SUBCUTANEOUS | 3 refills | Status: DC
  Filled 2022-08-30: qty 3, 28d supply, fill #0

## 2022-08-30 MED ORDER — OZEMPIC (0.25 OR 0.5 MG/DOSE) 2 MG/3ML ~~LOC~~ SOPN
0.2500 mg | PEN_INJECTOR | SUBCUTANEOUS | 3 refills | Status: DC
Start: 2022-08-30 — End: 2022-11-02
  Filled 2022-08-30: qty 3, fill #0

## 2022-08-30 NOTE — Addendum Note (Signed)
Addended by: Hannah Beat on: 08/30/2022 11:59 AM   Modules accepted: Orders

## 2022-08-30 NOTE — Progress Notes (Signed)
done

## 2022-08-30 NOTE — Progress Notes (Signed)
08/30/2022 Name: Micheal Clark MRN: [id number] DOB: 19-Feb-1955  Chief Complaint  Patient presents with   Medication Management   Diabetes    Micheal Clark is a 67 y.o. year old male who presented for a telephone visit.   They were referred to the pharmacist by their PCP for assistance in managing medication access.   Subjective:  Care Team: Primary Care Provider: Hannah Beat, MD ; Next Scheduled Visit: 09/16/22  Medication Access/Adherence  Current Pharmacy:  [id number] [place], [place] - [address] [place] [address] Phone: [phone] Fax: [phone]   Patient reports affordability concerns with their medications: Yes  Patient reports access/transportation concerns to their pharmacy: No  Patient reports adherence concerns with their medications:  No    Signed up for Medicare A/B, did not realize he would need to select Part D separately. Does plan to sign up for a Part D plan during Open Enrollment.    Diabetes:  Current medications: metformin XR 2000 mg daily, pioglitazone 45 mg daily, glipizide XL 20 mg daily,   Ozempic ordered for patient by PCP. Cost unaffordable without insurance.   Does report some symptoms of hypoglycemia    Hyperlipidemia/ASCVD Risk Reduction  Current lipid lowering medications: atorvastatin 40 mg daily Medications tried in the past: simvastatin   Objective:  Lab Results  Component Value Date   HGBA1C 10.7 (A) 08/19/2022    Lab Results  Component Value Date   CREATININE 0.81 08/19/2022   BUN 11 08/19/2022   NA 141 08/19/2022   K 5.1 08/19/2022   CL 104 08/19/2022   CO2 30 08/19/2022    Lab Results  Component Value Date   CHOL 196 08/19/2022   HDL 45.30 08/19/2022   LDLCALC 118 (H) 08/19/2022   LDLDIRECT 162.9 10/11/2011   TRIG 162.0 (H) 08/19/2022   CHOLHDL 4 08/19/2022    Medications Reviewed Today     Reviewed by Alden Hipp, RPH-CPP  (Pharmacist) on 08/30/22 at 1012  Med List Status: <None>   Medication Order Taking? Sig Documenting Provider Last Dose Status Informant  aspirin 81 MG tablet [id number] Yes Take 81 mg by mouth daily. [provider] Taking Active   atorvastatin (LIPITOR) 40 MG tablet [id number] Yes Take 1 tablet (40 mg total) by mouth daily. Debbe Odea, MD Taking Active   glipiZIDE (GLUCOTROL XL) 10 MG 24 hr tablet [id number] Yes Take 2 tablets (20 mg total) by mouth at bedtime. Copland, Karleen Hampshire, MD Taking Active   metFORMIN (GLUCOPHAGE-XR) 500 MG 24 hr tablet [id number] Yes TAKE FOUR TABLETS BY MOUTH EVERY MORNING WITH FOOD Copland, Spencer, MD Taking Active   metoprolol tartrate (LOPRESSOR) 100 MG tablet [id number]  Take 1 tablet (100 mg total) by mouth once for 1 dose. TWO HOURS PRIOR TO CARDIAC CTA Debbe Odea, MD  Expired 08/26/22 2359   pioglitazone (ACTOS) 45 MG tablet [id number] Yes Take 1 tablet (45 mg total) by mouth daily. Copland, Karleen Hampshire, MD Taking Active   Semaglutide, 1 MG/DOSE, 4 MG/3ML SOPN [id number]  Inject 1 mg into the skin once a week. 3rd month  Patient not taking: Reported on 08/26/2022   Hannah Beat, MD  Active   Semaglutide,0.25 or 0.5MG /DOS, (OZEMPIC, 0.25 OR 0.5 MG/DOSE,) 2 MG/3ML SOPN [id number]  Inject 0.25 mg into the skin once a week.  Patient not taking: Reported on 08/26/2022   Copland, Karleen Hampshire, MD  Active   Semaglutide,0.25 or 0.5MG /DOS, (OZEMPIC, 0.25 OR 0.5 MG/DOSE,) 2  MG/3ML SOPN [id number]  Inject 0.5 mg into the skin once a week. 2nd month after refill  Patient not taking: Reported on 08/26/2022   Hannah Beat, MD  Active   vitamin B-12 (CYANOCOBALAMIN) 1000 MCG tablet [id number] Yes Take 1,000 mcg by mouth daily.  [provider] Taking Active               Assessment/Plan:   Diabetes: - Currently uncontrolled and medication access concerns - Will collaborate with Pharmacy at [facility] for patient assistance support, as patient does not  have prescription drug insurance. Patient notes he believes his income is right at the cut off for Ozempic assistance. He will take his tax return to Pharmacy at [facility] later this week - Recommend to follow to see if Ozempic is an option. If not, consider addition of basal insulin as most cost effective solution until patient chooses a Part D plan in 2025. Goal to reduce/eliminate glipizide due to hypoglycemic risk, weight gain, possible risk of pancreatitic burnout. Follow outcome of ECHO - if heart failure, will recommend discontinuation of pioglitazone.    Hyperlipidemia/ASCVD Risk Reduction: - Currently uncontrolled but anticipate improvement with change to atorvastatin - Recommend to continue current regimen   Follow Up Plan: pending Ozempic access  Catie Eppie Gibson, PharmD, BCACP, CPP Clinical Pharmacist [facility] [phone]

## 2022-09-13 ENCOUNTER — Other Ambulatory Visit: Payer: Self-pay | Admitting: Cardiology

## 2022-09-13 ENCOUNTER — Telehealth (HOSPITAL_COMMUNITY): Payer: Self-pay | Admitting: Emergency Medicine

## 2022-09-13 DIAGNOSIS — R0609 Other forms of dyspnea: Secondary | ICD-10-CM

## 2022-09-13 DIAGNOSIS — E782 Mixed hyperlipidemia: Secondary | ICD-10-CM

## 2022-09-13 DIAGNOSIS — I2089 Other forms of angina pectoris: Secondary | ICD-10-CM

## 2022-09-13 NOTE — Telephone Encounter (Signed)
Attempted to call patient regarding upcoming cardiac CT appointment. °Left message on voicemail with name and callback number °Sara Wallace RN Navigator Cardiac Imaging °[facility] °[phone] Office °[phone] Cell ° °

## 2022-09-14 ENCOUNTER — Telehealth: Payer: Self-pay | Admitting: Family Medicine

## 2022-09-14 NOTE — Telephone Encounter (Signed)
Patient called in to follow up on this matter. Informed him of the information below and patient stated that he needs one that was within the last 30 days. Informed patient that it will only be covered every 3 months. He wanted to know the price and informed him to contact his insurance to see if they will cover it if he gets it done sooner. He stated that he would like a call in regards to this. Thank you!

## 2022-09-14 NOTE — Telephone Encounter (Signed)
Spoke with Micheal Clark and advised his last A1c was within 30 days at 10.7%.  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  I have sent a message for our billing department to see what the out of pocket cost for a A1c would be, since insurance will usually only cover this test every 90 days.  Awaiting response.  I told [name] I would call him back once I find out the cost.

## 2022-09-14 NOTE — Telephone Encounter (Signed)
He just had an A1c on 08/19/2022 which was 10.7%.  His insurance will only cover doing one every 3 months.

## 2022-09-14 NOTE — Telephone Encounter (Signed)
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 Need the ok to schedule lab

## 2022-09-15 ENCOUNTER — Other Ambulatory Visit: Payer: Self-pay | Admitting: Cardiology

## 2022-09-15 ENCOUNTER — Ambulatory Visit
Admission: RE | Admit: 2022-09-15 | Discharge: 2022-09-15 | Disposition: A | Discharge status: Home / Self Care | Payer: Self-pay | Source: Ambulatory Visit | Attending: Cardiology | Admitting: Cardiology

## 2022-09-15 ENCOUNTER — Other Ambulatory Visit (INDEPENDENT_AMBULATORY_CARE_PROVIDER_SITE_OTHER): Payer: Medicare Other

## 2022-09-15 ENCOUNTER — Ambulatory Visit
Admission: RE | Admit: 2022-09-15 | Discharge: 2022-09-15 | Disposition: A | Discharge status: Home / Self Care | Payer: Medicare Other | Source: Ambulatory Visit | Attending: Cardiology | Admitting: Cardiology

## 2022-09-15 DIAGNOSIS — R931 Abnormal findings on diagnostic imaging of heart and coronary circulation: Secondary | ICD-10-CM

## 2022-09-15 DIAGNOSIS — I2089 Other forms of angina pectoris: Secondary | ICD-10-CM | POA: Diagnosis present

## 2022-09-15 DIAGNOSIS — E119 Type 2 diabetes mellitus without complications: Secondary | ICD-10-CM

## 2022-09-15 DIAGNOSIS — R0609 Other forms of dyspnea: Secondary | ICD-10-CM | POA: Diagnosis present

## 2022-09-15 LAB — POCT GLYCOSYLATED HEMOGLOBIN (HGB A1C): Hemoglobin A1C: 10.2 % — AB (ref 4.0–5.6)

## 2022-09-15 MED ORDER — IOHEXOL 350 MG/ML SOLN
80.0000 mL | Freq: Once | INTRAVENOUS | Status: AC | PRN
  Administered 2022-09-15: 80 mL via INTRAVENOUS

## 2022-09-15 MED ORDER — NITROGLYCERIN 0.4 MG SL SUBL
0.8000 mg | SUBLINGUAL_TABLET | Freq: Once | SUBLINGUAL | Status: DC
  Filled 2022-09-15: qty 25

## 2022-09-15 MED ORDER — NITROGLYCERIN 0.4 MG SL SUBL
0.4000 mg | SUBLINGUAL_TABLET | Freq: Once | SUBLINGUAL | Status: AC
  Administered 2022-09-15: 0.4 mg via SUBLINGUAL
  Filled 2022-09-15: qty 25

## 2022-09-15 NOTE — Telephone Encounter (Signed)
Micheal Clark would like to speak with you about Ozempic PAP.  Please call patient at [phone].

## 2022-09-15 NOTE — Progress Notes (Signed)
Pt tolerated procedure well with no issues. Pt ABCs intact. Pt denies any complaints. Pt encouraged to drink plenty of water throughout the day. Pt ambulatory with steady gait.  

## 2022-09-15 NOTE — Telephone Encounter (Signed)
Spoke with Casimiro Needle and advised the poct A1c would run around $35 if insurance does not cover it.  Patient states understanding.  Lab appointment scheduled today at 4:00 pm.

## 2022-09-16 ENCOUNTER — Other Ambulatory Visit: Payer: Self-pay | Admitting: Pharmacist

## 2022-09-16 ENCOUNTER — Telehealth: Payer: Self-pay | Admitting: Family Medicine

## 2022-09-16 ENCOUNTER — Ambulatory Visit: Payer: Medicare Other

## 2022-09-16 ENCOUNTER — Encounter: Payer: Self-pay | Admitting: Family Medicine

## 2022-09-16 MED ORDER — PEN NEEDLES 32G X 4 MM MISC: 3 refills | Status: DC

## 2022-09-16 MED ORDER — BASAGLAR KWIKPEN 100 UNIT/ML ~~LOC~~ SOPN: 10.0000 [IU] | PEN_INJECTOR | Freq: Every day | SUBCUTANEOUS | 2 refills | Status: DC

## 2022-09-16 NOTE — Telephone Encounter (Signed)
Called patient back, left voicemail.

## 2022-09-16 NOTE — Telephone Encounter (Signed)
Pharmacy called stating that she spoke with you regarding Basaglar medication and you gave her a coupon card information.She said that it would not be covered because it has to be written as : 1 box at a 30 day supply of 15 units in order for it to be covered. My have to call pharmacy for better clarification

## 2022-09-16 NOTE — Telephone Encounter (Signed)
Called pharmacy back. Resent script to reflect 30 day supply for copay card to correctly work.   Catie Eppie Gibson, PharmD, BCACP, CPP Clinical Pharmacist [facility] [phone]

## 2022-09-16 NOTE — Progress Notes (Signed)
Care Coordination Call  Spoke with patient. He is over income for [employer] assistance through [employer]. He plans to pursue a Part D plan during Open Enrollment to take effect in 2025. In the meantime, basal insulin would be the best option for glycemic control. Copay cards bring cost down to $35/month.   Discussed with Dr. Dallas Schimke. Start Basaglar 10 units daily. Patient will pick up from the pharmacy and meet with me in office on Monday for teaching. Provided copay card information to pharmacy and confirmed they were able to bill correctly.    Catie Eppie Gibson, PharmD, BCACP, CPP Clinical Pharmacist [facility] [phone]

## 2022-09-17 ENCOUNTER — Telehealth: Payer: Self-pay | Admitting: Family Medicine

## 2022-09-17 NOTE — Telephone Encounter (Signed)
Patient called in stating that pharmacy gave him a different medication in place of the ozempic because he couldn't afford the ozempic.He is requesting a phone call to let him know if he is suppose to take all of the other medications that he's prescribed for his diabetes with this medication? Please advise.

## 2022-09-17 NOTE — Telephone Encounter (Signed)
Looks like Hospital doctor Insulin was given.  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  Will forward to [place] to address since she has been working with Mr. Vinson.

## 2022-09-19 NOTE — Telephone Encounter (Signed)
Micheal Clark,   Can you call Micheal Clark.  If we are avoiding insulin, the best thing would be Ozempic or Mounjaro by far.  He should sign up for insurance to cover medications.  I am not sure what the sign-up period would be.  The cheapest thing that I can possibly think of would be to add generic Saxagliptin.  It is not ideal, but it should get his A1c below 10 until he can get one of the injectables.  It is 54 dollars at [employer] with GoodRx - 110 at [employer].   Ok?

## 2022-09-20 ENCOUNTER — Telehealth: Payer: Self-pay | Admitting: Family Medicine

## 2022-09-20 MED ORDER — SAXAGLIPTIN HCL 5 MG PO TABS: 5.0000 mg | ORAL_TABLET | Freq: Every day | ORAL | 5 refills | Status: DC

## 2022-09-20 NOTE — Telephone Encounter (Signed)
Received voicemail from patient; called patient back and left voicemail noting that I had discussed plan with Dr. Patsy Lager and to let me know if any cost concerns/access issues with saxagliptin.

## 2022-09-20 NOTE — Addendum Note (Signed)
Addended by: Hannah Beat on: 09/20/2022 11:02 AM   Modules accepted: Orders

## 2022-09-20 NOTE — Telephone Encounter (Signed)
See other phone note

## 2022-09-20 NOTE — Telephone Encounter (Signed)
Pt called requesting a call from the pharmacist, Santina Evans. Pt states he was told that Santina Evans would be in our office today. Call back # [phone]

## 2022-09-20 NOTE — Telephone Encounter (Signed)
Micheal Clark, I appreciate your assistance, but I just talked to Casimiro Needle, and I am going to handle this myself.   Our plan is to add generic Saxagliptin until he signs up for insurance to get a GLP-1.    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  I agree with him that this is a reasonable plan.  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

## 2022-09-20 NOTE — Telephone Encounter (Signed)
Called patient. Left voicemail.   Reviewed DOT requirements. Patients with diabetes CAN be prescribed insulin (2018 legislative change) and must maintain glucometer records to submit with re-certification. If patients have <3 months of glucose data, they can be re-certified for 3 months (as opposed to the full year). Patient must not have recurrent severe hypoglycemic episodes. A1c must be <10%.   Will discuss with patient when call is returned.

## 2022-09-23 ENCOUNTER — Ambulatory Visit: Payer: Medicare Other | Attending: Cardiology

## 2022-09-23 DIAGNOSIS — R0609 Other forms of dyspnea: Secondary | ICD-10-CM | POA: Diagnosis not present

## 2022-09-23 LAB — ECHOCARDIOGRAM COMPLETE
Area-P 1/2: 3.99 cm2
S' Lateral: 2.5 cm

## 2022-09-23 MED ORDER — PERFLUTREN LIPID MICROSPHERE
1.0000 mL | INTRAVENOUS | Status: AC | PRN
Start: 2022-09-23 — End: 2022-09-23
  Administered 2022-09-23: 9 mL via INTRAVENOUS

## 2022-09-25 ENCOUNTER — Other Ambulatory Visit: Payer: Self-pay | Admitting: Family Medicine

## 2022-10-04 ENCOUNTER — Ambulatory Visit: Payer: Medicare Other | Admitting: Physician Assistant

## 2022-10-04 NOTE — Progress Notes (Deleted)
Cardiology Office Note    Date:  10/04/2022   ID:  Micheal Clark, DOB [birth year], MRN [id number]  PCP:  Hannah Beat, MD  Cardiologist:  Debbe Odea, MD  Electrophysiologist:  None   Chief Complaint: Follow-up  History of Present Illness:   Micheal Clark is a 67 y.o. male with history of ***  ***   Labs independently reviewed: 08/2022 - A1c 10.2 07/2022 - TSH normal, TC 196, TG 162, HDL 45, LDL 118, albumin 4.2, AST/ALT normal, Hgb 14.8, PLT 238, potassium 5.1, BUN 11, serum creatinine 0.81  Past Medical History:  Diagnosis Date   Diabetes type 2, uncontrolled 11/15/2011   Hyperlipidemia 11/15/2011   Personal history of adenomatous colonic polyps 11/24/2011   Smokeless tobacco use 10/11/2011    Past Surgical History:  Procedure Laterality Date   ORIF ACETABULAR FRACTURE Left 1998   left   TOTAL HIP ARTHROPLASTY Left 2021   WISDOM TOOTH EXTRACTION      Current Medications: No outpatient medications have been marked as taking for the 10/04/22 encounter (Appointment) with Sondra Barges, PA-C.    Allergies:   Patient has no known allergies.   Social History   Socioeconomic History   Marital status: Married    Spouse name: Not on file   Number of children: 2   Years of education: Not on file   Highest education level: Not on file  Occupational History   Occupation: [employer]    Employer: [employer]     Comment: truck driver  Tobacco Use   Smoking status: Former    Current packs/day: 0.00    Average packs/day: 1 pack/day for 8.0 years (8.0 ttl pk-yrs)    Types: Cigarettes    Start date: 03/08/1972    Quit date: 03/08/1980    Years since quitting: 42.6   Smokeless tobacco: Current    Types: Chew   Tobacco comments:    quit 1993  Vaping Use   Vaping status: Never Used  Substance and Sexual Activity   Alcohol use: Yes    Comment: occassionally-not wekly   Drug use: No   Sexual activity: Not on file  Other Topics Concern   Not on  file  Social History Narrative   Not on file   Social Determinants of Health   Financial Resource Strain: Low Risk  (03/10/2020)   Received from [facility], [facility]   Overall Financial Resource Strain (CARDIA)    Difficulty of Paying Living Expenses: Not very hard  Food Insecurity: No Food Insecurity (03/07/2020)   Received from [facility], [facility]   Hunger Vital Sign    Worried About Running Out of Food in the Last Year: Never true    Ran Out of Food in the Last Year: Never true  Transportation Needs: Not on file  Physical Activity: Not on file  Stress: No Stress Concern Present (03/10/2020)   Received from [facility], [facility]   [employer] of Occupational Health - Occupational Stress Questionnaire    Feeling of Stress : Only a little  Social Connections: Unknown (06/08/2021)   Received from [facility], [facility]   Social Network    Social Network: Not on file     Family History:  The patient's family history includes Diabetes in his mother; Hypertension in his brother; Vision loss in his paternal grandfather. There is no history of Colon cancer, Rectal cancer, or Stomach cancer.  ROS:   12-point review of systems is negative  unless otherwise noted in the HPI.   EKGs/Labs/Other Studies Reviewed:    Studies reviewed were summarized above. The additional studies were reviewed today:  2D echo 09/23/2022: 1. Left ventricular ejection fraction, by estimation, is 55 to 60%. The  left ventricle has normal function. The left ventricle has no regional  wall motion abnormalities. Left ventricular diastolic parameters are  consistent with Grade I diastolic  dysfunction (impaired relaxation).   2. Right ventricular systolic function is normal. The right ventricular  size is normal. There is normal pulmonary artery systolic pressure. The  estimated right ventricular systolic pressure is 21.2 mmHg.   3. Left atrial size was mildly dilated.   4.  The mitral valve is normal in structure. Mild mitral valve  regurgitation. No evidence of mitral stenosis.   5. The aortic valve has an indeterminant number of cusps. Aortic valve  regurgitation is not visualized. No aortic stenosis is present.   6. The inferior vena cava is normal in size with greater than 50%  respiratory variability, suggesting right atrial pressure of 3 mmHg.  __________  Coronary CTA 09/15/2022: FINDINGS: Aorta:  Normal size.  Aortic wall calcifications.  No dissection.   Aortic Valve:  Trileaflet.  No calcifications.   Coronary Arteries:  Normal coronary origin. Left dominance.   RCA is a nondominant artery. There is calcified plaque proximally causing mild stenosis (25-49%).   Left main gives rise to LAD and LCX arteries. LM has no disease.   LAD has calcified plaque proximally causing mild stenosis (25-49%).   LCX is a dominant artery. There is calcified plaque distally causing mild stenosis (moderate stenosis 50-69%).   Other findings:   Normal pulmonary vein drainage into the left atrium.   Normal left atrial appendage without a thrombus.   Normal size of the pulmonary artery.   IMPRESSION: 1. Coronary calcium score of 1666. This was 96th percentile for age and sex matched control. 2. Normal coronary origin with left dominance. 3. Calcified plaque in the distal LCx/prox L. PDA causing moderate stenosis (50-69%). 4. Mild proximal LAD and RCA stenosis (25-49%). 5. CAD-RADS 3. Moderate stenosis. Consider symptom-guided anti-ischemic pharmacotherapy as well as risk factor modification per guideline directed care. 6. Additional analysis with CT FFR will be submitted and reported separately.  ctFFR:  1. Left Main:  No significant stenosis. 2. LAD: No significant stenosis.  FFRct 0.88 3. LCX: No significant stenosis.  FFRct 0.9 4. RCA: significant stenosis, FFRct 0.77.  Non dominant artery   IMPRESSION: 1. CT FFR analysis showed significant  stenosis in the RCA, non dominant artery. 2.  Recommend medical management. __________  Treadmill MPI 10/13/2015: Blood pressure demonstrated a normal response to exercise. There was no ST segment deviation noted during stress. No T wave inversion was noted during stress. The study is normal. This is a low risk study. The left ventricular ejection fraction is normal (55-65%). __________  2D echo 10/10/2015: - Left ventricle: The cavity size was normal. There was mild    concentric hypertrophy. Systolic function was normal. The    estimated ejection fraction was in the range of 60% to 65%. Wall    motion was normal; there were no regional wall motion    abnormalities. Left ventricular diastolic function parameters    were normal.  - Pulmonary arteries: Systolic pressure was within the normal    range.    EKG:  EKG is ordered today.  The EKG ordered today demonstrates ***  Recent Labs: 08/19/2022: ALT 20; BUN  11; Creatinine, Ser 0.81; Hemoglobin 14.8; Platelets 238.0; Potassium 5.1; Sodium 141; TSH 2.74  Recent Lipid Panel    Component Value Date/Time   CHOL 196 08/19/2022 1252   TRIG 162.0 (H) 08/19/2022 1252   HDL 45.30 08/19/2022 1252   CHOLHDL 4 08/19/2022 1252   VLDL 32.4 08/19/2022 1252   LDLCALC 118 (H) 08/19/2022 1252   LDLCALC 110 (H) 09/29/2018 1321   LDLDIRECT 162.9 10/11/2011 1445    PHYSICAL EXAM:    VS:  There were no vitals taken for this visit.  BMI: There is no height or weight on file to calculate BMI.  Physical Exam  Wt Readings from Last 3 Encounters:  08/26/22 222 lb (100.7 kg)  08/19/22 224 lb (101.6 kg)  09/16/21 238 lb 6 oz (108.1 kg)     ASSESSMENT & PLAN:   ***   {Are you ordering a CV Procedure (e.g. stress test, cath, DCCV, TEE, etc)?   Press F2        :[id number]}     Disposition: F/u with Dr. Azucena Cecil or an APP in ***.   Medication Adjustments/Labs and Tests Ordered: Current medicines are reviewed at length with the patient  today.  Concerns regarding medicines are outlined above. Medication changes, Labs and Tests ordered today are summarized above and listed in the Patient Instructions accessible in Encounters.   Signed, Eula Listen, PA-C 10/04/2022 10:14 AM     [facility] [address] [place], [place] [address] [phone]

## 2022-10-19 ENCOUNTER — Telehealth: Payer: Self-pay | Admitting: Family Medicine

## 2022-10-19 NOTE — Telephone Encounter (Signed)
Patient dropped off document DMV, to be filled out by provider. Patient requested to send it back via Call Patient to pick up within 5-days. Document is located in providers tray at front office.Please advise at Mobile [phone] (mobile)

## 2022-10-19 NOTE — Telephone Encounter (Signed)
Handicap Placard Application placed in Dr. Cyndie Chime office in box to complete.

## 2022-10-20 NOTE — Telephone Encounter (Signed)
Micheal Clark notified by telephone that handicap placard application is ready to be picked up at the front desk.

## 2022-11-02 ENCOUNTER — Ambulatory Visit (INDEPENDENT_AMBULATORY_CARE_PROVIDER_SITE_OTHER): Payer: Medicare Other | Admitting: Family Medicine

## 2022-11-02 ENCOUNTER — Ambulatory Visit: Payer: Medicare Other | Admitting: Physician Assistant

## 2022-11-02 VITALS — BP 118/70 | HR 72 | Temp 98.6°F | Ht 68.0 in | Wt 222.0 lb

## 2022-11-02 DIAGNOSIS — Z7984 Long term (current) use of oral hypoglycemic drugs: Secondary | ICD-10-CM

## 2022-11-02 DIAGNOSIS — E119 Type 2 diabetes mellitus without complications: Secondary | ICD-10-CM

## 2022-11-02 LAB — POCT GLYCOSYLATED HEMOGLOBIN (HGB A1C): Hemoglobin A1C: 9 % — AB (ref 4.0–5.6)

## 2022-11-02 NOTE — Progress Notes (Signed)
Patient ID: Micheal Clark, male    DOB: Jun 22, 1955, 67 y.o.   MRN: [id number]  This visit was conducted in person.  BP 118/70   Pulse 72   Temp 98.6 F (37 C) (Temporal)   Ht 5\' 8"  (1.727 m)   Wt 222 lb (100.7 kg)   SpO2 97%   BMI 33.75 kg/m    CC:  Chief Complaint  Patient presents with   Diabetes    Subjective:   HPI: Micheal Clark is a 67 y.o. male  patient of Dr. Cyndie Chime presenting on 11/02/2022 for Diabetes  Reviewed last OV from PCP 07/2022  A1C 10.2  Added ozempic, but this was too costly per pt. Reviewed pharmacist note from 08/30/2022  Started basal insulin Balsalglar 10 units.. 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 now been on this in 1 month.  Today A1C 9.0  He has not been checking blood sugars a t home.  He has been cutting down soda and sweet tea.  Wt Readings from Last 3 Encounters:  11/02/22 222 lb (100.7 kg)  08/26/22 222 lb (100.7 kg)  08/19/22 224 lb (101.6 kg)           Relevant past medical, surgical, family and social history reviewed and updated as indicated. Interim medical history since our last visit reviewed. Allergies and medications reviewed and updated. Outpatient Medications Prior to Visit  Medication Sig Dispense Refill   aspirin 81 MG tablet Take 81 mg by mouth daily.     glipiZIDE (GLUCOTROL XL) 10 MG 24 hr tablet Take 2 tablets (20 mg total) by mouth at bedtime. 180 tablet 3   metFORMIN (GLUCOPHAGE-XR) 500 MG 24 hr tablet TAKE FOUR TABLETS BY MOUTH EVERY MORNING WITH FOOD 360 tablet 3   pioglitazone (ACTOS) 45 MG tablet Take 1 tablet (45 mg total) by mouth daily. 90 tablet 3   saxagliptin HCl (ONGLYZA) 5 MG TABS tablet Take 1 tablet (5 mg total) by mouth daily. 30 tablet 5   atorvastatin (LIPITOR) 40 MG tablet Take 1 tablet (40 mg total) by mouth daily. (Patient not taking: Reported on 11/02/2022) 90 tablet 3    Insulin Glargine (BASAGLAR KWIKPEN) 100 UNIT/ML Inject 10 Units into the skin daily. Titrate as instructed. Dispense 1 box for 30 day supply. (Patient not taking: Reported on 11/02/2022) 15 mL 2   Insulin Pen Needle (PEN NEEDLES) 32G X 4 MM MISC Use to inject insulin daily. (Patient not taking: Reported on 11/02/2022) 100 each 3   Semaglutide, 1 MG/DOSE, 4 MG/3ML SOPN Inject 1 mg into the skin once a week. 3rd month (Patient not taking: Reported on 09/16/2022) 3 mL 3   Semaglutide,0.25 or 0.5MG /DOS, (OZEMPIC, 0.25 OR 0.5 MG/DOSE,) 2 MG/3ML SOPN Inject 0.5 mg into the skin once a week. 2nd month after refill (Patient not taking: Reported on 09/16/2022) 3 mL 3   Semaglutide,0.25 or 0.5MG /DOS, (OZEMPIC, 0.25 OR 0.5 MG/DOSE,) 2 MG/3ML SOPN Inject 0.25 mg into the skin once a week. (Patient not taking: Reported on 09/16/2022) 3 mL 3   vitamin B-12 (CYANOCOBALAMIN) 1000 MCG tablet Take 1,000 mcg by mouth daily.  (Patient not taking: Reported on 11/02/2022)     No facility-administered medications prior to visit.     Per HPI unless specifically indicated in ROS section below Review of Systems  Constitutional:  Negative  for fatigue and fever.  HENT:  Negative for ear pain.   Eyes:  Negative for pain.  Respiratory:  Negative for cough and shortness of breath.   Cardiovascular:  Negative for chest pain, palpitations and leg swelling.  Gastrointestinal:  Negative for abdominal pain.  Genitourinary:  Negative for dysuria.  Musculoskeletal:  Negative for arthralgias.  Neurological:  Negative for syncope, light-headedness and headaches.  Psychiatric/Behavioral:  Negative for dysphoric mood.    Objective:  BP 118/70   Pulse 72   Temp 98.6 F (37 C) (Temporal)   Ht 5\' 8"  (1.727 m)   Wt 222 lb (100.7 kg)   SpO2 97%   BMI 33.75 kg/m   Wt Readings from Last 3 Encounters:  11/02/22 222 lb (100.7 kg)  08/26/22 222 lb (100.7 kg)  08/19/22 224 lb (101.6 kg)      Physical Exam Vitals reviewed.   Constitutional:      Appearance: He is well-developed.  HENT:     Head: Normocephalic.     Right Ear: Hearing normal.     Left Ear: Hearing normal.     Nose: Nose normal.     Mouth/Throat:     Mouth: Oropharynx is clear and moist and mucous membranes are normal.  Neck:     Thyroid: No thyroid mass or thyromegaly.     Vascular: No carotid bruit.     Trachea: Trachea normal.  Cardiovascular:     Rate and Rhythm: Normal rate and regular rhythm.     Pulses: Normal pulses.     Heart sounds: Heart sounds not distant. No murmur heard.    No friction rub. No gallop.     Comments: No peripheral edema Pulmonary:     Effort: Pulmonary effort is normal. No respiratory distress.     Breath sounds: Normal breath sounds.  Skin:    General: Skin is warm, dry and intact.     Findings: No rash.  Psychiatric:        Mood and Affect: Mood and affect normal.        Speech: Speech normal.        Behavior: Behavior normal.        Thought Content: Thought content normal.       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 Has follow-up for physical with PCP in 1 week.  Will defer medication changes to PCP. Encouraged patient to continue working on low carbohydrate diet, regular exercise and weight loss.  On glipizide xl 10 mg daily Metformin max dose 2000 mg daily  Actos 45 mg daily  Onglyza 5 mg daily... now been on this in 1 month.  Orders: -     POCT glycosylated hemoglobin (Hb A1C)    No follow-ups on file.   Micheal Nora, MD

## 2022-11-02 NOTE — Assessment & Plan Note (Signed)
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 Has follow-up for physical with PCP in 1 week.  Will defer medication changes to PCP. Encouraged patient to continue working on low carbohydrate diet, regular exercise and weight loss.  On glipizide xl 10 mg daily Metformin max dose 2000 mg daily  Actos 45 mg daily  Onglyza 5 mg daily... now been on this in 1 month.

## 2022-11-09 NOTE — Progress Notes (Deleted)
Micheal Clark T. Vee Bahe, MD, CAQ [facility] [address] [place], [address]  Phone: [phone]  FAX: [phone]  Micheal Clark - 67 y.o. male  MRN [id number]  Date of Birth: [birth year]  Date: 11/11/2022  PCP: Hannah Beat, MD  Referral: Hannah Beat, MD  No chief complaint on file.  Patient Care Team: Hannah Beat, MD as PCP - General (Family Medicine) Debbe Odea, MD as PCP - Cardiology (Cardiology) Subjective:   Micheal Clark is a 67 y.o. pleasant patient who presents for a medicare wellness examination:  Preventative Health Maintenance Visit:  Health Maintenance Summary Reviewed and updated, unless pt declines services.  Tobacco History Reviewed. Alcohol: No concerns, no excessive use Exercise Habits: Some activity, rec at least 30 mins 5 times a week STD concerns: no risk or activity to increase risk Drug Use: None  Diabetes Mellitus: Tolerating Medications: yes Compliance with diet: fair, There is no height or weight on file to calculate BMI. Exercise: minimal / intermittent Avg blood sugars at home: not checking Foot problems: none Hypoglycemia: none No nausea, vomitting, blurred vision, polyuria.  Lab Results  Component Value Date   HGBA1C 9.0 (A) 11/02/2022   HGBA1C 10.2 (A) 09/15/2022   HGBA1C 10.7 (A) 08/19/2022   Lab Results  Component Value Date   MICROALBUR <0.7 08/19/2022   LDLCALC 118 (H) 08/19/2022   CREATININE 0.81 08/19/2022    Wt Readings from Last 3 Encounters:  11/02/22 222 lb (100.7 kg)  08/26/22 222 lb (100.7 kg)  08/19/22 224 lb (101.6 kg)    Eye MD Shingrix Colonoscopy Foot Prevnar-20 Flu - never  Health Maintenance  Topic Date Due   Medicare Annual Wellness (AWV)  Never done   OPHTHALMOLOGY EXAM  Never done   Zoster Vaccines- Shingrix (1 of 2) Never done   Colonoscopy  11/24/2014   FOOT EXAM  08/26/2015   Pneumonia Vaccine 65+ Years  old (2 of 2 - PCV) 01/07/2022   COVID-19 Vaccine (6 - 2023-24 season) 09/26/2022   INFLUENZA VACCINE  04/25/2023 (Originally 08/26/2022)   HEMOGLOBIN A1C  05/03/2023   Diabetic kidney evaluation - eGFR measurement  08/19/2023   Diabetic kidney evaluation - Urine ACR  08/19/2023   DTaP/Tdap/Td (2 - Td or Tdap) 05/01/2030   Hepatitis C Screening  Completed   HIV Screening  Completed   HPV VACCINES  Aged Out    Immunization History  Administered Date(s) Administered   Moderna Sars-Covid-2 Vaccination 02/13/2019, 02/26/2019, 03/13/2019, 03/26/2019, 12/14/2019   Pneumococcal Polysaccharide-23 04/30/2020   Tdap 04/30/2020    Patient Active Problem List   Diagnosis Date Noted   Controlled type 2 diabetes mellitus without complication, without long-term current use of insulin (HCC) 11/15/2011    Priority: High   Hyperlipidemia LDL goal <70 11/15/2011    Priority: Medium    Smokeless tobacco use 10/11/2011    Priority: Medium    History of colonic polyps 11/24/2011   Obesity 10/11/2011    Past Medical History:  Diagnosis Date   Diabetes type 2, uncontrolled 11/15/2011   Hyperlipidemia 11/15/2011   Personal history of adenomatous colonic polyps 11/24/2011   Smokeless tobacco use 10/11/2011    Past Surgical History:  Procedure Laterality Date   ORIF ACETABULAR FRACTURE Left 1998   left   TOTAL HIP ARTHROPLASTY Left 2021   WISDOM TOOTH EXTRACTION      Family History  Problem Relation Age of Onset   Diabetes Mother  Hypertension Brother    Vision loss Paternal Grandfather    Colon cancer Neg Hx    Rectal cancer Neg Hx    Stomach cancer Neg Hx     Social History   Social History Narrative   Not on file    Past Medical History, Surgical History, Social History, Family History, Problem List, Medications, and Allergies have been reviewed and updated if relevant.  Review of Systems: Pertinent positives are listed above.  Otherwise, a full 14 point review of systems has  been done in full and it is negative except where it is noted positive.  Objective:   There were no vitals taken for this visit.     No data to display         Ideal Body Weight:   No results found.    09/16/2021    3:29 PM 04/30/2020    9:13 AM 10/05/2018    2:38 PM 07/01/2016    3:00 PM  Depression screen PHQ 2/9  Decreased Interest 0 0 0 1  Down, Depressed, Hopeless 0 0 0 0  PHQ - 2 Score 0 0 0 1     GEN: well developed, well nourished, no acute distress Eyes: conjunctiva and lids normal, PERRLA, EOMI ENT: TM clear, nares clear, oral exam WNL Neck: supple, no lymphadenopathy, no thyromegaly, no JVD Pulm: clear to auscultation and percussion, respiratory effort normal CV: regular rate and rhythm, S1-S2, no murmur, rub or gallop, no bruits, peripheral pulses normal and symmetric, no cyanosis, clubbing, edema or varicosities GI: soft, non-tender; no hepatosplenomegaly, masses; active bowel sounds all quadrants GU: deferred Lymph: no cervical, axillary or inguinal adenopathy MSK: gait normal, muscle tone and strength WNL, no joint swelling, effusions, discoloration, crepitus  SKIN: clear, good turgor, color WNL, no rashes, lesions, or ulcerations Neuro: normal mental status, normal strength, sensation, and motion Psych: alert; oriented to person, place and time, normally interactive and not anxious or depressed in appearance.  All labs reviewed with patient.  Results for orders placed or performed in visit on 11/02/22  POCT glycosylated hemoglobin (Hb A1C)  Result Value Ref Range   Hemoglobin A1C 9.0 (A) 4.0 - 5.6 %   HbA1c POC (<> result, manual entry)     HbA1c, POC (prediabetic range)     HbA1c, POC (controlled diabetic range)      Assessment and Plan:     ICD-10-CM   1. Healthcare maintenance  Z00.00       Health Maintenance Exam: The patient's preventative maintenance and recommended screening tests for an annual wellness exam were reviewed in full  today. Brought up to date unless services declined.  Counselled on the importance of diet, exercise, and its role in overall health and mortality. The patient's FH and SH was reviewed, including their home life, tobacco status, and drug and alcohol status.  Follow-up in 1 year for physical exam or additional follow-up below.  I have personally reviewed the Medicare Annual Wellness questionnaire and have noted 1. The patient's medical and social history 2. Their use of alcohol, tobacco or illicit drugs 3. Their current medications and supplements 4. The patient's functional ability including ADL's, fall risks, home safety risks and hearing or visual             impairment. 5. Diet and physical activities 6. Evidence for depression or mood disorders 7. Reviewed Updated provider list, see scanned forms and [facility] Snapshot.  8. Reviewed whether or not the patient has HCPOA or living will,  and discussed what this means with the patient.  Recommended he bring in a copy for his chart in [facility].  The patients weight, height, BMI and visual acuity have been recorded in the chart I have made referrals, counseling and provided education to the patient based review of the above and I have provided the pt with a written personalized care plan for preventive services.  I have provided the patient with a copy of your personalized plan for preventive services. Instructed to take the time to review along with their updated medication list.  Disposition: No follow-ups on file.  Future Appointments  Date Time Provider Department Center  11/11/2022  8:20 AM Hannah Beat, MD [facility]  12/02/2022  1:30 PM Dunn, Raymon Mutton, PA-C [facility] None    No orders of the defined types were placed in this encounter.  There are no discontinued medications. No orders of the defined types were placed in this encounter.   Signed,  Elpidio Galea. Jontae Sonier, MD   Allergies as of 11/11/2022   No Known Allergies       Medication List        Accurate as of November 09, 2022  9:22 AM. If you have any questions, ask your nurse or doctor.          aspirin 81 MG tablet Take 81 mg by mouth daily.   atorvastatin 40 MG tablet Commonly known as: LIPITOR Take 1 tablet (40 mg total) by mouth daily.   glipiZIDE 10 MG 24 hr tablet Commonly known as: GLUCOTROL XL Take 2 tablets (20 mg total) by mouth at bedtime.   metFORMIN 500 MG 24 hr tablet Commonly known as: GLUCOPHAGE-XR TAKE FOUR TABLETS BY MOUTH EVERY MORNING WITH FOOD   pioglitazone 45 MG tablet Commonly known as: ACTOS Take 1 tablet (45 mg total) by mouth daily.   saxagliptin HCl 5 MG Tabs tablet Commonly known as: ONGLYZA Take 1 tablet (5 mg total) by mouth daily.

## 2022-11-11 ENCOUNTER — Other Ambulatory Visit: Payer: Self-pay | Admitting: Family Medicine

## 2022-11-11 ENCOUNTER — Encounter: Payer: Self-pay | Admitting: Family Medicine

## 2022-11-11 DIAGNOSIS — Z0000 Encounter for general adult medical examination without abnormal findings: Secondary | ICD-10-CM

## 2022-11-26 NOTE — Progress Notes (Unsigned)
Cardiology Office Note    Date:  12/02/2022   ID:  Micheal Clark, DOB 1955-06-02, MRN [id number]  PCP:  Hannah Beat, MD  Cardiologist:  Debbe Odea, MD  Electrophysiologist:  None   Chief Complaint: Follow-up  History of Present Illness:   Micheal Clark is a 67 y.o. male with history of nonobstructive CAD by coronary CTA in 08/2022, DM2, HLD, and prior tobacco use who presents for follow-up of coronary CTA and echo.  He was evaluated by Dr. Okey Dupre in 09/2015 for exertional shortness of breath.  Echo in 09/2015 showed an EF of 60 to 65%, mild concentric LVH, no regional wall motion abnormalities, normal LV diastolic function parameters, and normal RVSP.  Treadmill MPI in 09/2015 showed no evidence of ischemia, and was overall low risk with an EF of 55 to 65%.  More recently, he was evaluated by Dr. Azucena Cecil in 08/2022 with exertional shortness of breath over the preceding 3 to 4 weeks.  Coronary CTA on 09/15/2022 showed a calcium score of 1666, which was the 96th percentile.  There was 25 to 49% stenosis in the proximal LAD and RCA along with 50 to 69% stenosis in the PDA.  ctFFR along the LAD and LCx at 0.88 and 0.9 respectively with a ctFFR of 0.77 in a nondominant RCA with medical management recommended.  Echo on 09/23/2022 showed an EF of 55 to 60%, no regional wall motion abnormalities, grade 1 diastolic dysfunction, normal RV systolic function and ventricular cavity size, normal RVSP, mildly dilated left atrium, mild mitral regurgitation, and an estimated right atrial pressure of 3 mmHg.  He comes in doing well from a cardiac perspective and is without symptoms of angina or cardiac decompensation.  No further dyspnea.  He was also having palpitations this past summer when he was more short of breath, though these have resolved.  No dizziness, presyncope, or syncope.  No lower extremity swelling or progressive orthopnea.  No falls or symptoms concerning for bleeding.  Adherent and  tolerating atorvastatin in place of simvastatin.  Quit smoking approximately 25 years ago.  Remains very active at work.  Does not have any acute cardiac concerns at this time.   Labs independently reviewed: 10/2022 - A1c 9.0 07/2022 - TSH normal, TC 196, TG 162, HDL 45, LDL 118, albumin 4.2, AST/ALT normal, Hgb 14.8, PLT 238, potassium 5.1, BUN 11, serum creatinine 0.81  Past Medical History:  Diagnosis Date   CAD (coronary artery disease)    Diabetes type 2, uncontrolled 11/15/2011   Hyperlipidemia 11/15/2011   Personal history of adenomatous colonic polyps 11/24/2011   Smokeless tobacco use 10/11/2011    Past Surgical History:  Procedure Laterality Date   ORIF ACETABULAR FRACTURE Left 1998   left   TOTAL HIP ARTHROPLASTY Left 2021   WISDOM TOOTH EXTRACTION      Current Medications: Current Meds  Medication Sig   aspirin 81 MG tablet Take 81 mg by mouth daily.   glipiZIDE (GLUCOTROL XL) 10 MG 24 hr tablet TAKE TWO TABLETS BY MOUTH AT BEDTIME   metFORMIN (GLUCOPHAGE-XR) 500 MG 24 hr tablet TAKE FOUR TABLETS BY MOUTH EVERY MORNING WITH FOOD   pioglitazone (ACTOS) 45 MG tablet Take 1 tablet (45 mg total) by mouth daily.   saxagliptin HCl (ONGLYZA) 5 MG TABS tablet Take 1 tablet (5 mg total) by mouth daily.    Allergies:   Patient has no known allergies.   Social History   Socioeconomic History   Marital status:  Married    Spouse name: Not on file   Number of children: 2   Years of education: Not on file   Highest education level: Not on file  Occupational History   Occupation: [employer]    Employer: [employer]     Comment: truck driver  Tobacco Use   Smoking status: Former    Current packs/day: 0.00    Average packs/day: 1 pack/day for 8.0 years (8.0 ttl pk-yrs)    Types: Cigarettes    Start date: 03/08/1972    Quit date: 03/08/1980    Years since quitting: 42.7   Smokeless tobacco: Current    Types: Chew   Tobacco comments:    quit 1993  Vaping Use    Vaping status: Never Used  Substance and Sexual Activity   Alcohol use: Yes    Comment: occassionally-not wekly   Drug use: No   Sexual activity: Not on file  Other Topics Concern   Not on file  Social History Narrative   Not on file   Social Determinants of Health   Financial Resource Strain: Low Risk  (03/10/2020)   Received [facility], [facility]   Overall Financial Resource Strain (CARDIA)    Difficulty of Paying Living Expenses: Not very hard  Food Insecurity: No Food Insecurity (03/07/2020)   Received from [facility], [facility]   Hunger Vital Sign    Worried About Running Out of Food in the Last Year: Never true    Ran Out of Food in the Last Year: Never true  Transportation Needs: Not on file  Physical Activity: Not on file  Stress: No Stress Concern Present (03/10/2020)   Received from [facility], [facility]   [employer] [facility]ational [facility] - Occupational Stress Questionnaire    Feeling of Stress : Only a little  Social Connections: Unknown (06/08/2021)   Received from [facility], [facility]   Social Network    Social Network: Not on file     Family History:  The patient's family history includes Diabetes in his mother; Hypertension in his brother; Vision loss in his paternal grandfather. There is no history of Colon cancer, Rectal cancer, or Stomach cancer.  ROS:   12-point review of systems is negative unless otherwise noted in the HPI.   EKGs/Labs/Other Studies Reviewed:    Studies reviewed were summarized above. The additional studies were reviewed today:  2D echo 09/23/2022: 1. Left ventricular ejection fraction, by estimation, is 55 to 60%. The  left ventricle has normal function. The left ventricle has no regional  wall motion abnormalities. Left ventricular diastolic parameters are  consistent with Grade I diastolic  dysfunction (impaired relaxation).   2. Right ventricular systolic function is normal. The right  ventricular  size is normal. There is normal pulmonary artery systolic pressure. The  estimated right ventricular systolic pressure is 21.2 mmHg.   3. Left atrial size was mildly dilated.   4. The mitral valve is normal in structure. Mild mitral valve  regurgitation. No evidence of mitral stenosis.   5. The aortic valve has an indeterminant number of cusps. Aortic valve  regurgitation is not visualized. No aortic stenosis is present.   6. The inferior vena cava is normal in size with greater than 50%  respiratory variability, suggesting right atrial pressure of 3 mmHg.  __________  Coronary CTA 09/15/2022: FINDINGS: Aorta:  Normal size.  Aortic wall calcifications.  No dissection.   Aortic Valve:  Trileaflet.  No calcifications.  Coronary Arteries:  Normal coronary origin. Left dominance.   RCA is a nondominant artery. There is calcified plaque proximally causing mild stenosis (25-49%).   Left main gives rise to LAD and LCX arteries. LM has no disease.   LAD has calcified plaque proximally causing mild stenosis (25-49%).   LCX is a dominant artery. There is calcified plaque distally causing mild stenosis (moderate stenosis 50-69%).   Other findings:   Normal pulmonary vein drainage into the left atrium.   Normal left atrial appendage without a thrombus.   Normal size of the pulmonary artery.   IMPRESSION: 1. Coronary calcium score of 1666. This was 96th percentile for age and sex matched control. 2. Normal coronary origin with left dominance. 3. Calcified plaque in the distal LCx/prox L. PDA causing moderate stenosis (50-69%). 4. Mild proximal LAD and RCA stenosis (25-49%). 5. CAD-RADS 3. Moderate stenosis. Consider symptom-guided anti-ischemic pharmacotherapy as well as risk factor modification per guideline directed care. 6. Additional analysis with CT FFR will be submitted and reported separately.   ctFFR: 1. Left Main:  No significant stenosis. 2. LAD: No  significant stenosis.  FFRct 0.88 3. LCX: No significant stenosis.  FFRct 0.9 4. RCA: significant stenosis, FFRct 0.77.  Non dominant artery   IMPRESSION: 1. CT FFR analysis showed significant stenosis in the RCA, non dominant artery. 2.  Recommend medical management. __________  Treadmill MPI 10/13/2015: Blood pressure demonstrated a normal response to exercise. There was no ST segment deviation noted during stress. No T wave inversion was noted during stress. The study is normal. This is a low risk study. The left ventricular ejection fraction is normal (55-65%). __________  2D echo 10/10/2015: - Left ventricle: The cavity size was normal. There was mild    concentric hypertrophy. Systolic function was normal. The    estimated ejection fraction was in the range of 60% to 65%. Wall    motion was normal; there were no regional wall motion    abnormalities. Left ventricular diastolic function parameters    were normal.  - Pulmonary arteries: Systolic pressure was within the normal    range.    EKG:  EKG is ordered today.  The EKG ordered today demonstrates sinus tachycardia, 107 bpm, bifascicular block with RBBB and LAFB, consistent with prior tracing  Recent Labs: 08/19/2022: ALT 20; BUN 11; Creatinine, Ser 0.81; Hemoglobin 14.8; Platelets 238.0; Potassium 5.1; Sodium 141; TSH 2.74  Recent Lipid Panel    Component Value Date/Time   CHOL 196 08/19/2022 1252   TRIG 162.0 (H) 08/19/2022 1252   HDL 45.30 08/19/2022 1252   CHOLHDL 4 08/19/2022 1252   VLDL 32.4 08/19/2022 1252   LDLCALC 118 (H) 08/19/2022 1252   LDLCALC 110 (H) 09/29/2018 1321   LDLDIRECT 162.9 10/11/2011 1445    PHYSICAL EXAM:    VS:  BP 116/78 (BP Location: Left Arm, Patient Position: Sitting, Cuff Size: Normal)   Pulse (!) 107   Ht 5\' 8"  (1.727 m)   Wt 221 lb 3.2 oz (100.3 kg)   SpO2 97%   BMI 33.63 kg/m   BMI: Body mass index is 33.63 kg/m.  Physical Exam Vitals reviewed.  Constitutional:       Appearance: He is well-developed.  HENT:     Head: Normocephalic and atraumatic.  Eyes:     General:        Right eye: No discharge.        Left eye: No discharge.  Neck:     Vascular: No  JVD.  Cardiovascular:     Rate and Rhythm: Normal rate and regular rhythm.     Pulses:          Posterior tibial pulses are 2+ on the right side and 2+ on the left side.     Heart sounds: Normal heart sounds, S1 normal and S2 normal. Heart sounds not distant. No midsystolic click and no opening snap. No murmur heard.    No friction rub.  Pulmonary:     Effort: Pulmonary effort is normal. No respiratory distress.     Breath sounds: Normal breath sounds. No decreased breath sounds, wheezing, rhonchi or rales.  Chest:     Chest wall: No tenderness.  Abdominal:     General: There is no distension.  Musculoskeletal:     Cervical back: Normal range of motion.     Right lower leg: No edema.     Left lower leg: No edema.  Skin:    General: Skin is warm and dry.     Nails: There is no clubbing.  Neurological:     Mental Status: He is alert and oriented to person, place, and time.  Psychiatric:        Speech: Speech normal.        Behavior: Behavior normal.        Thought Content: Thought content normal.        Judgment: Judgment normal.     Wt Readings from Last 3 Encounters:  12/02/22 221 lb 3.2 oz (100.3 kg)  11/02/22 222 lb (100.7 kg)  08/26/22 222 lb (100.7 kg)     ASSESSMENT & PLAN:   Nonobstructive CAD: He comes in doing well from a cardiac perspective and is without symptoms of angina or cardiac decompensation.  No further dyspnea.  Recent coronary CTA showed nonobstructive CAD as outlined above with nondominant RCA to be medically managed.  Continue aspirin 81 mg and atorvastatin 40 mg (started in 08/2022) in place of simvastatin.  Check lipid panel, direct LDL, and LFT with recommendation to escalate lipid-lowering therapy as indicated to achieve target LDL.  Aggressive risk factor  modification and primary prevention.  No indication for further ischemic testing at this time.  HLD: LDL 118 in 07/2022 with normal AST/ALT at that time.  Target LDL less than 70.  Management as outlined above.  DM2: A1c 9.0.  Ongoing management per PCP.  Could consider transitioning of therapy to [place] or [place] down the road.    Disposition: F/u with Dr. Azucena Cecil or an APP in 6 months.   Medication Adjustments/Labs and Tests Ordered: Current medicines are reviewed at length with the patient today.  Concerns regarding medicines are outlined above. Medication changes, Labs and Tests ordered today are summarized above and listed in the Patient Instructions accessible in Encounters.   Signed, Eula Listen, PA-C 12/02/2022 2:05 PM     [facility] [place] [address] [place], [place] [address] [phone]

## 2022-12-02 ENCOUNTER — Encounter: Payer: Self-pay | Admitting: Physician Assistant

## 2022-12-02 ENCOUNTER — Ambulatory Visit: Payer: Medicare Other | Attending: Physician Assistant | Admitting: Physician Assistant

## 2022-12-02 VITALS — BP 116/78 | HR 107 | Ht 68.0 in | Wt 221.2 lb

## 2022-12-02 DIAGNOSIS — I2510 Atherosclerotic heart disease of native coronary artery without angina pectoris: Secondary | ICD-10-CM

## 2022-12-02 DIAGNOSIS — E118 Type 2 diabetes mellitus with unspecified complications: Secondary | ICD-10-CM

## 2022-12-02 DIAGNOSIS — E785 Hyperlipidemia, unspecified: Secondary | ICD-10-CM

## 2022-12-02 NOTE — Patient Instructions (Signed)
Medication Instructions:  Your physician recommends that you continue on your current medications as directed. Please refer to the Current Medication list given to you today.  *If you need a refill on your cardiac medications before your next appointment, please call your pharmacy*  Lab Work: TODAY: Lipids, LDL (direct), and LFTs  If you have labs (blood work) drawn today and your tests are completely normal, you will receive your results only by: MyChart Message (if you have MyChart) OR A paper copy in the mail If you have any lab test that is abnormal or we need to change your treatment, we will call you to review the results.  Follow-Up: At [facility], you and your health needs are our priority.  As part of our continuing mission to provide you with exceptional heart care, we have created designated Provider Care Teams.  These Care Teams include your primary Cardiologist (physician) and Advanced Practice Providers (APPs -  Physician Assistants and Nurse Practitioners) who all work together to provide you with the care you need, when you need it  Your next appointment:   6 months  Provider:   Eula Listen, PA-C

## 2022-12-06 ENCOUNTER — Other Ambulatory Visit: Payer: Self-pay | Admitting: Emergency Medicine

## 2022-12-06 LAB — HEPATIC FUNCTION PANEL
ALT: 17 [IU]/L (ref 0–44)
AST: 14 [IU]/L (ref 0–40)
Albumin: 4.2 g/dL (ref 3.9–4.9)
Alkaline Phosphatase: 86 IU/L (ref 44–121)
Bilirubin Total: 0.8 mg/dL (ref 0.0–1.2)
Bilirubin, Direct: 0.25 mg/dL (ref 0.00–0.40)
Total Protein: 6.3 g/dL (ref 6.0–8.5)

## 2022-12-06 LAB — LIPID PANEL
Chol/HDL Ratio: 3.7 ratio (ref 0.0–5.0)
Cholesterol, Total: 121 mg/dL (ref 100–199)
HDL: 33 mg/dL — ABNORMAL LOW (ref >39)
LDL Chol Calc (NIH): 67 mg/dL (ref 0–99)
Triglycerides: 116 mg/dL (ref 0–149)
VLDL Cholesterol Cal: 21 mg/dL (ref 5–40)

## 2022-12-06 LAB — LDL CHOLESTEROL, DIRECT: LDL Direct: 68 mg/dL (ref 0–99)

## 2022-12-06 MED ORDER — ATORVASTATIN CALCIUM 40 MG PO TABS: 40.0000 mg | ORAL_TABLET | Freq: Every day | ORAL | 3 refills | Status: DC

## 2023-01-04 NOTE — Progress Notes (Unsigned)
Micheal Clark T. Micheal Cerro, MD, CAQ Sports Medicine [facility] at [facility] [address] [place], [address]  Phone: [phone]  FAX: [phone]  WIL BLEE - 67 y.o. male  MRN [id number]  Date of Birth: [birth year]  Date: 01/05/2023  PCP: Micheal Beat, MD  Referral: Micheal Beat, MD  No chief complaint on file.  Patient Care Team: Micheal Beat, MD as PCP - General (Family Medicine) Debbe Odea, MD as PCP - Cardiology (Cardiology) Subjective:   Micheal Clark is a 67 y.o. pleasant patient who presents with the following:  Preventative Health Maintenance Visit:  Health Maintenance Summary Reviewed and updated, unless pt declines services.  Tobacco History Reviewed. Alcohol: No concerns, no excessive use Exercise Habits: Some activity, rec at least 30 mins 5 times a week STD concerns: no risk or activity to increase risk Drug Use: None  Medicare wellness? Eye Shingrix Colon Foot Prevnar-20   Health Maintenance  Topic Date Due   Medicare Annual Wellness (AWV)  Never done   OPHTHALMOLOGY EXAM  Never done   Zoster Vaccines- Shingrix (1 of 2) Never done   Colonoscopy  11/24/2014   FOOT EXAM  08/26/2015   Pneumonia Vaccine 18+ Years old (2 of 2 - PCV) 04/30/2021   COVID-19 Vaccine (6 - 2023-24 season) 09/26/2022   INFLUENZA VACCINE  04/25/2023 (Originally 08/26/2022)   HEMOGLOBIN A1C  05/03/2023   Diabetic kidney evaluation - eGFR measurement  08/19/2023   Diabetic kidney evaluation - Urine ACR  08/19/2023   DTaP/Tdap/Td (2 - Td or Tdap) 05/01/2030   Hepatitis C Screening  Completed   HIV Screening  Completed   HPV VACCINES  Aged Out   Immunization History  Administered Date(s) Administered   Moderna Sars-Covid-2 Vaccination 02/13/2019, 02/26/2019, 03/13/2019, 03/26/2019, 12/14/2019   Pneumococcal Polysaccharide-23 04/30/2020   Tdap 04/30/2020   Patient Active Problem List   Diagnosis Date Noted    Controlled type 2 diabetes mellitus without complication, without long-term current use of insulin (HCC) 11/15/2011    Priority: High   Hyperlipidemia LDL goal <70 11/15/2011    Priority: Medium    Smokeless tobacco use 10/11/2011    Priority: Medium    History of colonic polyps 11/24/2011   Obesity 10/11/2011    Past Medical History:  Diagnosis Date   CAD (coronary artery disease)    Diabetes type 2, uncontrolled 11/15/2011   Hyperlipidemia 11/15/2011   Personal history of adenomatous colonic polyps 11/24/2011   Smokeless tobacco use 10/11/2011    Past Surgical History:  Procedure Laterality Date   ORIF ACETABULAR FRACTURE Left 1998   left   TOTAL HIP ARTHROPLASTY Left 2021   WISDOM TOOTH EXTRACTION      Family History  Problem Relation Age of Onset   Diabetes Mother    Hypertension Brother    Vision loss Paternal Grandfather    Colon cancer Neg Hx    Rectal cancer Neg Hx    Stomach cancer Neg Hx     Social History   Social History Narrative   Not on file    Past Medical History, Surgical History, Social History, Family History, Problem List, Medications, and Allergies have been reviewed and updated if relevant.  Review of Systems: Pertinent positives are listed above.  Otherwise, a full 14 point review of systems has been done in full and it is negative except where it is noted positive.  Objective:   There were no vitals taken for this visit. Ideal Body Weight:  Ideal Body Weight:   No results found.    09/16/2021    3:29 PM 04/30/2020    9:13 AM 10/05/2018    2:38 PM 07/01/2016    3:00 PM  Depression screen PHQ 2/9  Decreased Interest 0 0 0 1  Down, Depressed, Hopeless 0 0 0 0  PHQ - 2 Score 0 0 0 1     GEN: well developed, well nourished, no acute distress Eyes: conjunctiva and lids normal, PERRLA, EOMI ENT: TM clear, nares clear, oral exam WNL Neck: supple, no lymphadenopathy, no thyromegaly, no JVD Pulm: clear to auscultation and percussion,  respiratory effort normal CV: regular rate and rhythm, S1-S2, no murmur, rub or gallop, no bruits, peripheral pulses normal and symmetric, no cyanosis, clubbing, edema or varicosities GI: soft, non-tender; no hepatosplenomegaly, masses; active bowel sounds all quadrants GU: deferred Lymph: no cervical, axillary or inguinal adenopathy MSK: gait normal, muscle tone and strength WNL, no joint swelling, effusions, discoloration, crepitus  SKIN: clear, good turgor, color WNL, no rashes, lesions, or ulcerations Neuro: normal mental status, normal strength, sensation, and motion Psych: alert; oriented to person, place and time, normally interactive and not anxious or depressed in appearance.  All labs reviewed with patient. Results for orders placed or performed in visit on 12/02/22  Lipid panel  Result Value Ref Range   Cholesterol, Total 121 100 - 199 mg/dL   Triglycerides 161 0 - 149 mg/dL   HDL 33 (L) >09 mg/dL   VLDL Cholesterol Cal 21 5 - 40 mg/dL   LDL Chol Calc (NIH) 67 0 - 99 mg/dL   LDL CALC COMMENT: Comment    Chol/HDL Ratio 3.7 0.0 - 5.0 ratio  Hepatic function panel  Result Value Ref Range   Total Protein 6.3 6.0 - 8.5 g/dL   Albumin 4.2 3.9 - 4.9 g/dL   Bilirubin Total 0.8 0.0 - 1.2 mg/dL   Bilirubin, Direct 6.04 0.00 - 0.40 mg/dL   Alkaline Phosphatase 86 44 - 121 IU/L   AST 14 0 - 40 IU/L   ALT 17 0 - 44 IU/L  Direct LDL  Result Value Ref Range   LDL Direct 68 0 - 99 mg/dL    Assessment and Plan:     ICD-10-CM   1. Healthcare maintenance  Z00.00       Health Maintenance Exam: The patient's preventative maintenance and recommended screening tests for an annual wellness exam were reviewed in full today. Brought up to date unless services declined.  Counselled on the importance of diet, exercise, and its role in overall health and mortality. The patient's FH and SH was reviewed, including their home life, tobacco status, and drug and alcohol status.  Follow-up in  1 year for physical exam or additional follow-up below.  Disposition: No follow-ups on file.  No orders of the defined types were placed in this encounter.  There are no discontinued medications. No orders of the defined types were placed in this encounter.   Signed,  Micheal Clark. Dreana Britz, MD   Allergies as of 01/05/2023   No Known Allergies      Medication List        Accurate as of January 04, 2023  1:47 PM. If you have any questions, ask your nurse or doctor.          aspirin 81 MG tablet Take 81 mg by mouth daily.   atorvastatin 40 MG tablet Commonly known as: LIPITOR Take 1 tablet (40 mg total) by mouth daily.  glipiZIDE 10 MG 24 hr tablet Commonly known as: GLUCOTROL XL TAKE TWO TABLETS BY MOUTH AT BEDTIME   metFORMIN 500 MG 24 hr tablet Commonly known as: GLUCOPHAGE-XR TAKE FOUR TABLETS BY MOUTH EVERY MORNING WITH FOOD   pioglitazone 45 MG tablet Commonly known as: ACTOS Take 1 tablet (45 mg total) by mouth daily.   saxagliptin HCl 5 MG Tabs tablet Commonly known as: ONGLYZA Take 1 tablet (5 mg total) by mouth daily.

## 2023-01-05 ENCOUNTER — Encounter: Payer: Self-pay | Admitting: Family Medicine

## 2023-01-05 ENCOUNTER — Ambulatory Visit (INDEPENDENT_AMBULATORY_CARE_PROVIDER_SITE_OTHER): Payer: Medicare Other | Admitting: Family Medicine

## 2023-01-05 VITALS — BP 102/70 | HR 72 | Temp 97.3°F | Ht 66.0 in | Wt 228.2 lb

## 2023-01-05 DIAGNOSIS — Z23 Encounter for immunization: Secondary | ICD-10-CM

## 2023-01-05 DIAGNOSIS — Z7985 Long-term (current) use of injectable non-insulin antidiabetic drugs: Secondary | ICD-10-CM

## 2023-01-05 DIAGNOSIS — Z1211 Encounter for screening for malignant neoplasm of colon: Secondary | ICD-10-CM | POA: Diagnosis not present

## 2023-01-05 DIAGNOSIS — Z0000 Encounter for general adult medical examination without abnormal findings: Secondary | ICD-10-CM | POA: Diagnosis not present

## 2023-01-05 DIAGNOSIS — E119 Type 2 diabetes mellitus without complications: Secondary | ICD-10-CM

## 2023-01-05 DIAGNOSIS — Z7984 Long term (current) use of oral hypoglycemic drugs: Secondary | ICD-10-CM | POA: Diagnosis not present

## 2023-01-05 MED ORDER — OZEMPIC (0.25 OR 0.5 MG/DOSE) 2 MG/3ML ~~LOC~~ SOPN: 0.2500 mg | PEN_INJECTOR | SUBCUTANEOUS | 1 refills | Status: DC

## 2023-01-05 MED ORDER — SEMAGLUTIDE (1 MG/DOSE) 4 MG/3ML ~~LOC~~ SOPN: 1.0000 mg | PEN_INJECTOR | SUBCUTANEOUS | 3 refills | Status: DC

## 2023-01-05 NOTE — Patient Instructions (Signed)
Start Ozempic in January.  0.25 mg once a week  After one month, increase dose to 0.5 mg  After another month, increase to 1 mg  Follow-up with me in 3 months

## 2023-02-02 ENCOUNTER — Telehealth: Payer: Self-pay | Admitting: *Deleted

## 2023-02-02 NOTE — Telephone Encounter (Signed)
 Copied from CRM [id number]. Topic: Clinical - Medication Question >> Feb 02, 2023 12:17 PM Taleah C wrote: Reason for CRM: pt called and requested for Dr. Eleanore nurse to give him a call at [phone] to clarify if he should be taking his other medications while using Ozempic . Please call back and advisee.

## 2023-02-02 NOTE — Telephone Encounter (Signed)
 Yes, he should still be taking his other diabetes medication.  His blood sugar has been really bad.

## 2023-02-02 NOTE — Telephone Encounter (Signed)
 Ozell notified as instructed by telephone.  He states he has not gotten the Ozempic  yet.  Per Rx that was sent to pharmacy, it states not to fill until January when patient's new insurance goes in effect.  I advised Kore to call [facility] and provide them with his new insurance and let them know he has a Rx on file for Ozempic  he needs to get filled.  He also ask about colonoscopy referral.  I provided phone number to Dr. Darilyn office and told him he can call to schedule that appointment.

## 2023-02-07 ENCOUNTER — Other Ambulatory Visit (HOSPITAL_COMMUNITY): Payer: Self-pay

## 2023-02-15 ENCOUNTER — Other Ambulatory Visit (HOSPITAL_COMMUNITY): Payer: Self-pay

## 2023-02-15 ENCOUNTER — Telehealth: Payer: Self-pay

## 2023-02-15 NOTE — Telephone Encounter (Signed)
Pharmacy Patient Advocate Encounter   Received notification from CoverMyMeds that prior authorization for Ozempic (0.25 or 0.5 MG/DOSE) 2MG /3ML pen-injectors is required/requested.   Insurance verification completed.   The patient is insured through [employer] .   Per test claim: PA required; PA submitted to above mentioned insurance via CoverMyMeds Key/confirmation #/EOC [id number] Status is pending

## 2023-02-16 ENCOUNTER — Other Ambulatory Visit (HOSPITAL_COMMUNITY): Payer: Self-pay

## 2023-02-16 NOTE — Telephone Encounter (Signed)
Pharmacy Patient Advocate Encounter  Received notification from [facility] that Prior Authorization for Ozempic (0.25 or 0.5 MG/DOSE) 2MG /3ML pen-injectors  has been APPROVED from 02/15/23 to 01/25/24. Ran test claim, Copay is $497.00. This test claim was processed through [facility]- copay amounts may vary at other pharmacies due to pharmacy/plan contracts, or as the patient moves through the different stages of their insurance plan.  **Pt has a $450 deductible**   PA #/Case ID/Reference #: [id number]

## 2023-02-19 ENCOUNTER — Other Ambulatory Visit: Payer: Self-pay | Admitting: Family Medicine

## 2023-04-27 ENCOUNTER — Other Ambulatory Visit: Payer: Self-pay | Admitting: Family Medicine

## 2023-05-09 ENCOUNTER — Ambulatory Visit: Payer: Medicare Other | Admitting: Family Medicine

## 2023-05-12 ENCOUNTER — Ambulatory Visit: Payer: Medicare Other | Admitting: Family Medicine

## 2023-05-18 ENCOUNTER — Emergency Department

## 2023-05-18 ENCOUNTER — Ambulatory Visit: Admitting: Family Medicine

## 2023-05-18 ENCOUNTER — Emergency Department
Admission: EM | Admit: 2023-05-18 | Discharge: 2023-05-18 | Disposition: A | Discharge status: Home / Self Care | Attending: Emergency Medicine | Admitting: Emergency Medicine

## 2023-05-18 ENCOUNTER — Ambulatory Visit: Payer: Self-pay

## 2023-05-18 DIAGNOSIS — Z8673 Personal history of transient ischemic attack (TIA), and cerebral infarction without residual deficits: Secondary | ICD-10-CM | POA: Insufficient documentation

## 2023-05-18 DIAGNOSIS — R42 Dizziness and giddiness: Secondary | ICD-10-CM | POA: Diagnosis not present

## 2023-05-18 DIAGNOSIS — E119 Type 2 diabetes mellitus without complications: Secondary | ICD-10-CM | POA: Insufficient documentation

## 2023-05-18 DIAGNOSIS — I2510 Atherosclerotic heart disease of native coronary artery without angina pectoris: Secondary | ICD-10-CM | POA: Diagnosis not present

## 2023-05-18 LAB — URINALYSIS, ROUTINE W REFLEX MICROSCOPIC
Bacteria, UA: NONE SEEN
Bilirubin Urine: NEGATIVE
Glucose, UA: >=500 mg/dL — AB
Hgb urine dipstick: NEGATIVE
Ketones, ur: NEGATIVE mg/dL
Leukocytes,Ua: NEGATIVE
Nitrite: NEGATIVE
Protein, ur: NEGATIVE mg/dL
Specific Gravity, Urine: 1.029 (ref 1.005–1.030)
pH: 5 (ref 5.0–8.0)

## 2023-05-18 LAB — CBC
HCT: 44 % (ref 39.0–52.0)
Hemoglobin: 14.6 g/dL (ref 13.0–17.0)
MCH: 30.5 pg (ref 26.0–34.0)
MCHC: 33.2 g/dL (ref 30.0–36.0)
MCV: 92.1 fL (ref 80.0–100.0)
Platelets: 210 10*3/uL (ref 150–400)
RBC: 4.78 MIL/uL (ref 4.22–5.81)
RDW: 12.4 % (ref 11.5–15.5)
WBC: 8.4 10*3/uL (ref 4.0–10.5)
nRBC: 0 % (ref 0.0–0.2)

## 2023-05-18 LAB — CBG MONITORING, ED: Glucose-Capillary: 246 mg/dL — ABNORMAL HIGH (ref 70–99)

## 2023-05-18 LAB — BASIC METABOLIC PANEL WITH GFR
Anion gap: 9 (ref 5–15)
BUN: 11 mg/dL (ref 8–23)
CO2: 25 mmol/L (ref 22–32)
Calcium: 9 mg/dL (ref 8.9–10.3)
Chloride: 105 mmol/L (ref 98–111)
Creatinine, Ser: 0.76 mg/dL (ref 0.61–1.24)
GFR, Estimated: >60 mL/min (ref >60)
Glucose, Bld: 260 mg/dL — ABNORMAL HIGH (ref 70–99)
Potassium: 4.1 mmol/L (ref 3.5–5.1)
Sodium: 139 mmol/L (ref 135–145)

## 2023-05-18 MED ORDER — MECLIZINE HCL 25 MG PO TABS: 25.0000 mg | ORAL_TABLET | Freq: Three times a day (TID) | ORAL | 0 refills | Status: DC | PRN

## 2023-05-18 MED ORDER — MECLIZINE HCL 25 MG PO TABS
25.0000 mg | ORAL_TABLET | Freq: Once | ORAL | Status: AC
  Administered 2023-05-18: 25 mg via ORAL
  Filled 2023-05-18: qty 1

## 2023-05-18 NOTE — ED Notes (Signed)
 Pt to ED 24 HA. Upon assessment, pt is reporting intermittent dizziness on exertion since Monday. Denies N/V/SOB/chest pain.

## 2023-05-18 NOTE — Telephone Encounter (Signed)
 Chief Complaint: dizziness Symptoms: see above Frequency: since yesterday Pertinent Negatives: Patient denies CP, SOB, palpitations, bleeding, N/V, diaphoresis, one-sided weakness, falls, H/A, blurry vision Disposition: [] ED /[x] Urgent Care (no appt availability in office) / [] Appointment(In office/virtual)/ []  [place] [facility]/ [] Home Care/ [] Refused Recommended Disposition /[] [facility]/ []  Follow-up with PCP Additional Notes: Pt reports dizziness since yesterday. States he has never had this before. Pt states dizziness if worse when he first stands and begins walking. Pt states he is careful of how he walks as to not fall. Hx of Type 2 DM controlled. Pt states he does not check his BG. Denies CP, SOB, palpitations, bleeding, N/V, bloody stools, diaphoresis, one-sided weakness, blurry vision, H/A. Endorses he is eating and drinking normally. RN advised pt he should be seen today. Only availability in the office was at 1520 this afternoon and pt states he would prefer to be seen in the AM d/t transportation issues. RN advised pt go to UC and gave him the address and wait time of the closest UC to his home address. RN advised pt he should not drive with dizziness. Pt states he will call his daughter for a ride. RN advised pt if he develops heart palpitations, H/A, blurry vision, CP, SOB, N/V, one-sided weakness or any worsening to call 911. Pt verbalized understanding.   Copied from CRM [id number]. Topic: Clinical - Red Word Triage >> May 18, 2023  8:31 AM Adonis Hoot wrote: Red Word that prompted transfer to Nurse Triage: lightheaded,dizziness when standing moving around Reason for Disposition  [1] MODERATE dizziness (e.g., interferes with normal activities) AND [2] has NOT been evaluated by doctor (or NP/PA) for this  (Exception: Dizziness caused by heat exposure, sudden standing, or poor fluid intake.)  Answer Assessment - Initial Assessment Questions 1. DESCRIPTION: "Describe your  dizziness."     "Seems like everything is zooming in and out" 2. LIGHTHEADED: "Do you feel lightheaded?" (e.g., somewhat faint, woozy, weak upon standing)     No, does not feel like he is going to pass out 3. VERTIGO: "Do you feel like either you or the room is spinning or tilting?" (i.e. vertigo)     No  4. SEVERITY: "How bad is it?"  "Do you feel like you are going to faint?" "Can you stand and walk?"   - MILD: Feels slightly dizzy, but walking normally.   - MODERATE: Feels unsteady when walking, but not falling; interferes with normal activities (e.g., school, work).   - SEVERE: Unable to walk without falling, or requires assistance to walk without falling; feels like passing out now.      "Sometimes when I first get up and am walking I am careful of how I step, sometimes I think I am going to lean one way or the other" 5. ONSET:  "When did the dizziness begin?"     Started yesterday 6. AGGRAVATING FACTORS: "Does anything make it worse?" (e.g., standing, change in head position)     Worse when getting up to stand 7. HEART RATE: "Can you tell me your heart rate?" "How many beats in 15 seconds?"  (Note: not all patients can do this)       No  8. CAUSE: "What do you think is causing the dizziness?"     N/A 9. RECURRENT SYMPTOM: "Have you had dizziness before?" If Yes, ask: "When was the last time?" "What happened that time?"     "Not that I remember" 10. OTHER SYMPTOMS: "Do you have any  other symptoms?" (e.g., fever, chest pain, vomiting, diarrhea, bleeding)       Denies other symptoms. Does not check BG. Endorses eating and drinking normally. Was taking Ozempic , now is not. Denies one-sided weakness  Protocols used: Dizziness - Lightheadedness-A-AH

## 2023-05-18 NOTE — ED Provider Notes (Signed)
 [facility] Provider Note    Event Date/Time   First MD Initiated Contact with Patient 05/18/23 1204     (approximate)   History   Dizziness   HPI  Micheal Clark is a 68 y.o. male   who presents to the emergency department today with concerns for dizziness.  He describes it as a sense of having difficulty with ambulation.  He feels like he is off balance.  This started 2 days ago.  He denies any associated headache.  No blurred vision or slurred speech.  Denies any weakness in his arms or legs.  Did have an episode of dizziness many many years ago which she was told was due to inner ear issues.  Denies any recent illness.  Does have a history of diabetes and states blood sugars usually run a little higher than 200.     Physical Exam   Triage Vital Signs: ED Triage Vitals  Encounter Vitals Group     BP 05/18/23 1133 (!) 149/85     Systolic BP Percentile --      Diastolic BP Percentile --      Pulse Rate 05/18/23 1133 73     Resp 05/18/23 1133 18     Temp 05/18/23 1133 98.5 F (36.9 C)     Temp src --      SpO2 05/18/23 1133 96 %     Weight 05/18/23 1134 226 lb (102.5 kg)     Height 05/18/23 1134 5\' 8"  (1.727 m)     Head Circumference --      Peak Flow --      Pain Score 05/18/23 1138 0     Pain Loc --      Pain Education --      Exclude from Growth Chart --     Most recent vital signs: Vitals:   05/18/23 1133  BP: (!) 149/85  Pulse: 73  Resp: 18  Temp: 98.5 F (36.9 C)  SpO2: 96%   General: Awake, alert, oriented. CV:  Good peripheral perfusion. Regular rate and rhythm. Resp:  Normal effort. Lungs clear. Abd:  No distention.    ED Results / Procedures / Treatments   Labs (all labs ordered are listed, but only abnormal results are displayed) Labs Reviewed  BASIC METABOLIC PANEL WITH GFR - Abnormal; Notable for the following components:      Result Value   Glucose, Bld 260 (*)    All other components within normal limits   URINALYSIS, ROUTINE W REFLEX MICROSCOPIC - Abnormal; Notable for the following components:   Color, Urine YELLOW (*)    APPearance CLEAR (*)    Glucose, UA >=500 (*)    All other components within normal limits  CBG MONITORING, ED - Abnormal; Notable for the following components:   Glucose-Capillary 246 (*)    All other components within normal limits  CBC     EKG  I, Marylynn Soho, attending physician, personally viewed and interpreted this EKG  EKG Time: 1138 Rate: 74 Rhythm: normal sinus rhythm Axis: left axis deviation Intervals: qtc 444 QRS: RBBB, LAFB ST changes: no st elevation Impression: abnormal ekg   RADIOLOGY I independently interpreted and visualized the MR brain. My interpretation: no large stroke, no mass Radiology interpretation:  IMPRESSION:  Negative MRI of the brain. No acute or focal lesion to explain the  patient's symptoms.      PROCEDURES:  Critical Care performed: No    MEDICATIONS ORDERED IN ED:  Medications - No data to display   IMPRESSION / MDM / ASSESSMENT AND PLAN / ED COURSE  I reviewed the triage vital signs and the nursing notes.                              Differential diagnosis includes, but is not limited to, CVA, vertigo, anemia, electrolyte abnormality  Patient's presentation is most consistent with acute presentation with potential threat to life or bodily function.  Patient presented to the emergency department today because of concerns for dizziness and difficulty with ambulation.  Patient states he has distant history of inner ear issues.  However patient has risk factors of diabetes for CVA.  Will obtain MRI to evaluate for stroke.  Will give patient meclizine .  MR without acute stroke or concerning abnormality. Blood work without concerning anemia, or electrolyte abnormality. At this time do think peripheral vertigo likely.       FINAL CLINICAL IMPRESSION(S) / ED DIAGNOSES   Final diagnoses:  Dizziness      Note:  This document was prepared using Dragon voice recognition software and may include unintentional dictation errors.    Marylynn Soho, MD 05/18/23 [phone]

## 2023-05-18 NOTE — ED Notes (Signed)
 Pt to MRI

## 2023-05-18 NOTE — ED Triage Notes (Signed)
 Pt presents to the ED POV from home for dizziness that started Monday night. Pt is also a diabetic and his BG has been running high. Pt denies any pain. Pt reports that the dizziness feels like he is off balance. Worse when standing and walking. No dizziness when lying down.   CBG 246

## 2023-05-23 NOTE — Progress Notes (Deleted)
 Cardiology Office Note    Date:  05/23/2023   ID:  Micheal, Clark 1955-04-20, MRN [id number]  PCP:  Scherrie Curt, MD  Cardiologist:  Constancia Delton, MD  Electrophysiologist:  None   Chief Complaint: Follow up  History of Present Illness:   Micheal Clark is a 68 y.o. male with history of nonobstructive CAD by coronary CTA in 08/2022, DM2, HLD, and prior tobacco use who presents for follow-up of ***.   He was evaluated by Dr. Nolan Clark in 09/2015 for exertional shortness of breath.  Echo in 09/2015 showed an EF of 60 to 65%, mild concentric LVH, no regional wall motion abnormalities, normal LV diastolic function parameters, and normal RVSP.  Treadmill MPI in 09/2015 showed no evidence of ischemia, and was overall low risk with an EF of 55 to 65%.   He was evaluated by Dr. Junnie Clark in 08/2022 with exertional shortness of breath over the preceding 3 to 4 weeks.  Coronary CTA on 09/15/2022 showed a calcium  score of 1666, which was the 96th percentile.  There was 25 to 49% stenosis in the proximal LAD and RCA along with 50 to 69% stenosis in the PDA.  ctFFR along the LAD and LCx at 0.88 and 0.9 respectively with a ctFFR of 0.77 in a nondominant RCA with medical management recommended.  Echo on 09/23/2022 showed an EF of 55 to 60%, no regional wall motion abnormalities, grade 1 diastolic dysfunction, normal RV systolic function and ventricular cavity size, normal RVSP, mildly dilated left atrium, mild mitral regurgitation, and an estimated right atrial pressure of 3 mmHg.  He was seen in follow-up in 11/2022 and was without symptoms of angina, cardiac decompensation, or further dyspnea.  Symptoms of palpitations had also resolved.  No further testing was indicated at that time.  He was seen in the ED on 05/18/2023 with a sensation of dizziness/being off balance that started 2 days prior.  He reported his blood sugars were running in the 200s.  Glucose in the ED of 260.  EKG showed sinus rhythm  with known bifascicular block.  MRI of the brain that showed no acute findings.  He was treated with meclizine  for vertigo.  ***   Labs independently reviewed: 04/2023 - Hgb 14.6, PLT 210, potassium 4.1, BUN 11, serum creatinine 0.76 07/2022 - direct LDL 68, TC 121, TG 116, HDL 33, albumin 4.2, AST/ALT normal, TSH normal  Past Medical History:  Diagnosis Date   CAD (coronary artery disease)    Diabetes type 2, uncontrolled 11/15/2011   Hyperlipidemia 11/15/2011   Personal history of adenomatous colonic polyps 11/24/2011   Smokeless tobacco use 10/11/2011    Past Surgical History:  Procedure Laterality Date   ORIF ACETABULAR FRACTURE Left 1998   left   TOTAL HIP ARTHROPLASTY Left 2021   WISDOM TOOTH EXTRACTION      Current Medications: No outpatient medications have been marked as taking for the 05/25/23 encounter (Appointment) with Roark Chick, PA-C.    Allergies:   Patient has no known allergies.   Social History   Socioeconomic History   Marital status: Married    Spouse name: Not on file   Number of children: 2   Years of education: Not on file   Highest education level: Not on file  Occupational History   Occupation: [employer]    Employer: [employer]     Comment: truck driver  Tobacco Use   Smoking status: Former    Current packs/day: 0.00  Average packs/day: 1 pack/day for 8.0 years (8.0 ttl pk-yrs)    Types: Cigarettes    Start date: 03/08/1972    Quit date: 03/08/1980    Years since quitting: 43.2   Smokeless tobacco: Current    Types: Chew   Tobacco comments:    quit 1993  Vaping Use   Vaping status: Never Used  Substance and Sexual Activity   Alcohol use: Yes    Comment: occassionally-not wekly   Drug use: No   Sexual activity: Not on file  Other Topics Concern   Not on file  Social History Narrative   Not on file   Social Drivers of Health   Financial Resource Strain: Low Risk  (03/10/2020)   [facility]   Overall Financial Resource Strain (CARDIA)    Difficulty of Paying Living Expenses: Not very hard  Food Insecurity: No Food Insecurity (03/07/2020)   [facility] from [facility], [facility]   Hunger Vital Sign    Worried About Running Out of Food in the Last Year: Never true    Ran Out of Food in the Last Year: Never true  Transportation Needs: Not on file  Physical Activity: Not on file  Stress: No Stress Concern Present (03/10/2020)   Received from [facility], [facility]   [employer] [facility] - Occupational Stress Questionnaire    Feeling of Stress : Only a little  Social Connections: Unknown (06/08/2021)   Received from [facility], [facility]   Social Network    Social Network: Not on file     Family History:  The patient's family history includes Diabetes in his mother; Hypertension in his brother; Vision loss in his paternal grandfather. There is no history of Colon cancer, Rectal cancer, or Stomach cancer.  ROS:   12-point review of systems is negative unless otherwise noted in the HPI.   EKGs/Labs/Other Studies Reviewed:    Studies reviewed were summarized above. The additional studies were reviewed today:  2D echo 09/23/2022: 1. Left ventricular ejection fraction, by estimation, is 55 to 60%. The  left ventricle has normal function. The left ventricle has no regional  wall motion abnormalities. Left ventricular diastolic parameters are  consistent with Grade I diastolic  dysfunction (impaired relaxation).   2. Right ventricular systolic function is normal. The right ventricular  size is normal. There is normal pulmonary artery systolic pressure. The  estimated right ventricular systolic pressure is 21.2 mmHg.   3. Left atrial size was mildly dilated.   4. The mitral valve is normal in structure. Mild mitral valve  regurgitation. No evidence of mitral stenosis.   5. The aortic valve has an indeterminant number of cusps. Aortic  valve  regurgitation is not visualized. No aortic stenosis is present.   6. The inferior vena cava is normal in size with greater than 50%  respiratory variability, suggesting right atrial pressure of 3 mmHg.  __________   Coronary CTA 09/15/2022: FINDINGS: Aorta:  Normal size.  Aortic wall calcifications.  No dissection.   Aortic Valve:  Trileaflet.  No calcifications.   Coronary Arteries:  Normal coronary origin. Left dominance.   RCA is a nondominant artery. There is calcified plaque proximally causing mild stenosis (25-49%).   Left main gives rise to LAD and LCX arteries. LM has no disease.   LAD has calcified plaque proximally causing mild stenosis (25-49%).   LCX is a dominant artery. There is calcified plaque distally causing mild stenosis (moderate stenosis  50-69%).   Other findings:   Normal pulmonary vein drainage into the left atrium.   Normal left atrial appendage without a thrombus.   Normal size of the pulmonary artery.   IMPRESSION: 1. Coronary calcium  score of 1666. This was 96th percentile for age and sex matched control. 2. Normal coronary origin with left dominance. 3. Calcified plaque in the distal LCx/prox L. PDA causing moderate stenosis (50-69%). 4. Mild proximal LAD and RCA stenosis (25-49%). 5. CAD-RADS 3. Moderate stenosis. Consider symptom-guided anti-ischemic pharmacotherapy as well as risk factor modification per guideline directed care. 6. Additional analysis with CT FFR will be submitted and reported separately.     ctFFR: 1. Left Main:  No significant stenosis. 2. LAD: No significant stenosis.  FFRct 0.88 3. LCX: No significant stenosis.  FFRct 0.9 4. RCA: significant stenosis, FFRct 0.77.  Non dominant artery   IMPRESSION: 1. CT FFR analysis showed significant stenosis in the RCA, non dominant artery. 2.  Recommend medical management. __________   Treadmill MPI 10/13/2015: Blood pressure demonstrated a normal response to  exercise. There was no ST segment deviation noted during stress. No T wave inversion was noted during stress. The study is normal. This is a low risk study. The left ventricular ejection fraction is normal (55-65%). __________   2D echo 10/10/2015: - Left ventricle: The cavity size was normal. There was mild    concentric hypertrophy. Systolic function was normal. The    estimated ejection fraction was in the range of 60% to 65%. Wall    motion was normal; there were no regional wall motion    abnormalities. Left ventricular diastolic function parameters    were normal.  - Pulmonary arteries: Systolic pressure was within the normal    range.    EKG:  EKG is ordered today.  The EKG ordered today demonstrates ***  Recent Labs: 08/19/2022: TSH 2.74 12/02/2022: ALT 17 05/18/2023: BUN 11; Creatinine, Ser 0.76; Hemoglobin 14.6; Platelets 210; Potassium 4.1; Sodium 139  Recent Lipid Panel    Component Value Date/Time   CHOL 121 12/02/2022 1404   TRIG 116 12/02/2022 1404   HDL 33 (L) 12/02/2022 1404   CHOLHDL 3.7 12/02/2022 1404   CHOLHDL 4 08/19/2022 1252   VLDL 32.4 08/19/2022 1252   LDLCALC 67 12/02/2022 1404   LDLCALC 110 (H) 09/29/2018 1321   LDLDIRECT 68 12/02/2022 1404   LDLDIRECT 162.9 10/11/2011 1445    PHYSICAL EXAM:    VS:  There were no vitals taken for this visit.  BMI: There is no height or weight on file to calculate BMI.  Physical Exam  Wt Readings from Last 3 Encounters:  05/18/23 226 lb (102.5 kg)  05/18/23 226 lb (102.5 kg)  01/05/23 228 lb 4 oz (103.5 kg)     ASSESSMENT & PLAN:   Nonobstructive CAD:  HLD: LDL 68 in 07/2022 with normal AST/ALT at that time.  Dizziness:  DM2: Last A1c improved at 6.4 from 03/2020.   {Are you ordering a CV Procedure (e.g. stress test, cath, DCCV, TEE, etc)?   Press F2        :[id number]}     Disposition: F/u with Dr. Junnie Clark or an APP in ***.   Medication Adjustments/Labs and Tests Ordered: Current  medicines are reviewed at length with the patient today.  Concerns regarding medicines are outlined above. Medication changes, Labs and Tests ordered today are summarized above and listed in the Patient Instructions accessible in Encounters.   Russel Courser, PA-C 05/23/2023 12:49  PM     [facility] [address] [place], [place] [address] [phone]

## 2023-05-25 ENCOUNTER — Ambulatory Visit: Payer: Medicare Other | Admitting: Physician Assistant

## 2023-06-05 NOTE — Progress Notes (Unsigned)
     Micheal Fayson T. Dominico Rod, MD, CAQ Sports Medicine [facility] [address] [place], [address]  Phone: [phone]  FAX: [phone]  Micheal Clark - 68 y.o. male  MRN [id number]  Date of Birth: [birth year]  Date: 06/08/2023  PCP: Scherrie Curt, MD  Referral: Scherrie Curt, MD  No chief complaint on file.  Subjective:   Micheal Clark is a 68 y.o. very pleasant male patient with There is no height or weight on file to calculate BMI. who presents with the following:  Micheal Clark is here for diabetes follow-up.  There have been times when he has had severely and poorly controlled diabetes.    Diabetes Mellitus: Tolerating Medications: yes Compliance with diet: fair, There is no height or weight on file to calculate BMI. Exercise: minimal / intermittent Avg blood sugars at home: not checking Foot problems: none Hypoglycemia: none No nausea, vomitting, blurred vision, polyuria.  Lab Results  Component Value Date   HGBA1C 9.0 (A) 11/02/2022   HGBA1C 10.2 (A) 09/15/2022   HGBA1C 10.7 (A) 08/19/2022   Lab Results  Component Value Date   MICROALBUR <0.7 08/19/2022   LDLCALC 67 12/02/2022   CREATININE 0.76 05/18/2023    Wt Readings from Last 3 Encounters:  05/18/23 226 lb (102.5 kg)  05/18/23 226 lb (102.5 kg)  01/05/23 228 lb 4 oz (103.5 kg)     Review of Systems is noted in the HPI, as appropriate  Objective:   There were no vitals taken for this visit.  GEN: No acute distress; alert,appropriate. PULM: Breathing comfortably in no respiratory distress PSYCH: Normally interactive.   Laboratory and Imaging Data:  Assessment and Plan:   ***

## 2023-06-07 NOTE — Progress Notes (Deleted)
 Cardiology Office Note    Date:  06/07/2023   ID:  CAMRYN EISENREICH, DOB [birth year], MRN [id number]  PCP:  Scherrie Curt, MD  Cardiologist:  Constancia Delton, MD  Electrophysiologist:  None   Chief Complaint: Follow up  History of Present Illness:   Micheal Clark is a 68 y.o. male with history of nonobstructive CAD by coronary CTA in 08/2022, DM2, HLD, and prior tobacco use who presents for follow-up of ***.   He was evaluated by Dr. Nolan Battle in 09/2015 for exertional shortness of breath.  Echo in 09/2015 showed an EF of 60 to 65%, mild concentric LVH, no regional wall motion abnormalities, normal LV diastolic function parameters, and normal RVSP.  Treadmill MPI in 09/2015 showed no evidence of ischemia, and was overall low risk with an EF of 55 to 65%.   He was evaluated by Dr. Junnie Olives in 08/2022 with exertional shortness of breath over the preceding 3 to 4 weeks.  Coronary CTA on 09/15/2022 showed a calcium  score of 1666, which was the 96th percentile.  There was 25 to 49% stenosis in the proximal LAD and RCA along with 50 to 69% stenosis in the PDA.  ctFFR along the LAD and LCx at 0.88 and 0.9 respectively with a ctFFR of 0.77 in a nondominant RCA with medical management recommended.  Echo on 09/23/2022 showed an EF of 55 to 60%, no regional wall motion abnormalities, grade 1 diastolic dysfunction, normal RV systolic function and ventricular cavity size, normal RVSP, mildly dilated left atrium, mild mitral regurgitation, and an estimated right atrial pressure of 3 mmHg.  He was seen in follow-up in 11/2022 and was without symptoms of angina, cardiac decompensation, or further dyspnea.  Symptoms of palpitations had also resolved.  No further testing was indicated at that time.  He was seen in the ED on 05/18/2023 with a sensation of dizziness/being off balance that started 2 days prior.  He reported his blood sugars were running in the 200s.  Glucose in the ED of 260.  EKG showed sinus rhythm  with known bifascicular block.  MRI of the brain that showed no acute findings.  He was treated with meclizine  for vertigo.  ***   Labs independently reviewed: 04/2023 - Hgb 14.6, PLT 210, potassium 4.1, BUN 11, serum creatinine 0.76 07/2022 - direct LDL 68, TC 121, TG 116, HDL 33, albumin 4.2, AST/ALT normal, TSH normal  Past Medical History:  Diagnosis Date   CAD (coronary artery disease)    Diabetes type 2, uncontrolled 11/15/2011   Hyperlipidemia 11/15/2011   Personal history of adenomatous colonic polyps 11/24/2011   Smokeless tobacco use 10/11/2011    Past Surgical History:  Procedure Laterality Date   ORIF ACETABULAR FRACTURE Left 1998   left   TOTAL HIP ARTHROPLASTY Left 2021   WISDOM TOOTH EXTRACTION      Current Medications: No outpatient medications have been marked as taking for the 06/08/23 encounter (Appointment) with Roark Chick, PA-C.    Allergies:   Patient has no known allergies.   Social History   Socioeconomic History   Marital status: Married    Spouse name: Not on file   Number of children: 2   Years of education: Not on file   Highest education level: Not on file  Occupational History   Occupation: [employer]    Employer: [employer]     Comment: truck driver  Tobacco Use   Smoking status: Former    Current packs/day: 0.00  Average packs/day: 1 pack/day for 8.0 years (8.0 ttl pk-yrs)    Types: Cigarettes    Start date: 03/08/1972    Quit date: 03/08/1980    Years since quitting: 43.2   Smokeless tobacco: Current    Types: Chew   Tobacco comments:    quit 1993  Vaping Use   Vaping status: Never Used  Substance and Sexual Activity   Alcohol use: Yes    Comment: occassionally-not wekly   Drug use: No   Sexual activity: Not on file  Other Topics Concern   Not on file  Social History Narrative   Not on file   Social Drivers of Health   Financial Resource Strain: Low Risk  (03/10/2020)   Received [facility], [facility]   Overall Financial Resource Strain (CARDIA)    Difficulty of Paying Living Expenses: Not very hard  Food Insecurity: No Food Insecurity (03/07/2020)   Received from [facility], [facility]   Hunger Vital Sign    Worried About Running Out of Food in the Last Year: Never true    Ran Out of Food in the Last Year: Never true  Transportation Needs: Not on file  Physical Activity: Not on file  Stress: No Stress Concern Present (03/10/2020)   Received from [facility], [facility]   [employer] [facility]ational [facility] - Occupational Stress Questionnaire    Feeling of Stress : Only a little  Social Connections: Unknown (06/08/2021)   Received from [facility], [facility]   Social Network    Social Network: Not on file     Family History:  The patient's family history includes Diabetes in his mother; Hypertension in his brother; Vision loss in his paternal grandfather. There is no history of Colon cancer, Rectal cancer, or Stomach cancer.  ROS:   12-point review of systems is negative unless otherwise noted in the HPI.   EKGs/Labs/Other Studies Reviewed:    Studies reviewed were summarized above. The additional studies were reviewed today:  2D echo 09/23/2022: 1. Left ventricular ejection fraction, by estimation, is 55 to 60%. The  left ventricle has normal function. The left ventricle has no regional  wall motion abnormalities. Left ventricular diastolic parameters are  consistent with Grade I diastolic  dysfunction (impaired relaxation).   2. Right ventricular systolic function is normal. The right ventricular  size is normal. There is normal pulmonary artery systolic pressure. The  estimated right ventricular systolic pressure is 21.2 mmHg.   3. Left atrial size was mildly dilated.   4. The mitral valve is normal in structure. Mild mitral valve  regurgitation. No evidence of mitral stenosis.   5. The aortic valve has an indeterminant number of cusps. Aortic  valve  regurgitation is not visualized. No aortic stenosis is present.   6. The inferior vena cava is normal in size with greater than 50%  respiratory variability, suggesting right atrial pressure of 3 mmHg.  __________   Coronary CTA 09/15/2022: FINDINGS: Aorta:  Normal size.  Aortic wall calcifications.  No dissection.   Aortic Valve:  Trileaflet.  No calcifications.   Coronary Arteries:  Normal coronary origin. Left dominance.   RCA is a nondominant artery. There is calcified plaque proximally causing mild stenosis (25-49%).   Left main gives rise to LAD and LCX arteries. LM has no disease.   LAD has calcified plaque proximally causing mild stenosis (25-49%).   LCX is a dominant artery. There is calcified plaque distally causing mild stenosis (moderate stenosis  50-69%).   Other findings:   Normal pulmonary vein drainage into the left atrium.   Normal left atrial appendage without a thrombus.   Normal size of the pulmonary artery.   IMPRESSION: 1. Coronary calcium  score of 1666. This was 96th percentile for age and sex matched control. 2. Normal coronary origin with left dominance. 3. Calcified plaque in the distal LCx/prox L. PDA causing moderate stenosis (50-69%). 4. Mild proximal LAD and RCA stenosis (25-49%). 5. CAD-RADS 3. Moderate stenosis. Consider symptom-guided anti-ischemic pharmacotherapy as well as risk factor modification per guideline directed care. 6. Additional analysis with CT FFR will be submitted and reported separately.     ctFFR: 1. Left Main:  No significant stenosis. 2. LAD: No significant stenosis.  FFRct 0.88 3. LCX: No significant stenosis.  FFRct 0.9 4. RCA: significant stenosis, FFRct 0.77.  Non dominant artery   IMPRESSION: 1. CT FFR analysis showed significant stenosis in the RCA, non dominant artery. 2.  Recommend medical management. __________   Treadmill MPI 10/13/2015: Blood pressure demonstrated a normal response to  exercise. There was no ST segment deviation noted during stress. No T wave inversion was noted during stress. The study is normal. This is a low risk study. The left ventricular ejection fraction is normal (55-65%). __________   2D echo 10/10/2015: - Left ventricle: The cavity size was normal. There was mild    concentric hypertrophy. Systolic function was normal. The    estimated ejection fraction was in the range of 60% to 65%. Wall    motion was normal; there were no regional wall motion    abnormalities. Left ventricular diastolic function parameters    were normal.  - Pulmonary arteries: Systolic pressure was within the normal    range.    EKG:  EKG is ordered today.  The EKG ordered today demonstrates ***  Recent Labs: 08/19/2022: TSH 2.74 12/02/2022: ALT 17 05/18/2023: BUN 11; Creatinine, Ser 0.76; Hemoglobin 14.6; Platelets 210; Potassium 4.1; Sodium 139  Recent Lipid Panel    Component Value Date/Time   CHOL 121 12/02/2022 1404   TRIG 116 12/02/2022 1404   HDL 33 (L) 12/02/2022 1404   CHOLHDL 3.7 12/02/2022 1404   CHOLHDL 4 08/19/2022 1252   VLDL 32.4 08/19/2022 1252   LDLCALC 67 12/02/2022 1404   LDLCALC 110 (H) 09/29/2018 1321   LDLDIRECT 68 12/02/2022 1404   LDLDIRECT 162.9 10/11/2011 1445    PHYSICAL EXAM:    VS:  There were no vitals taken for this visit.  BMI: There is no height or weight on file to calculate BMI.  Physical Exam  Wt Readings from Last 3 Encounters:  05/18/23 226 lb (102.5 kg)  05/18/23 226 lb (102.5 kg)  01/05/23 228 lb 4 oz (103.5 kg)     ASSESSMENT & PLAN:   Nonobstructive CAD:  HLD: LDL 68 in 07/2022 with normal AST/ALT at that time.  Dizziness:  DM2: Last A1c improved at 6.4 from 03/2020.   {Are you ordering a CV Procedure (e.g. stress test, cath, DCCV, TEE, etc)?   Press F2        :[id number]}     Disposition: F/u with Dr. Junnie Olives or an APP in ***.   Medication Adjustments/Labs and Tests Ordered: Current  medicines are reviewed at length with the patient today.  Concerns regarding medicines are outlined above. Medication changes, Labs and Tests ordered today are summarized above and listed in the Patient Instructions accessible in Encounters.   Signed, Varney Gentleman, PA-C 06/07/2023 5:25  PM     [facility] [address] [place], [place] [address] [phone]

## 2023-06-08 ENCOUNTER — Ambulatory Visit (INDEPENDENT_AMBULATORY_CARE_PROVIDER_SITE_OTHER): Admitting: Family Medicine

## 2023-06-08 ENCOUNTER — Ambulatory Visit: Admitting: Physician Assistant

## 2023-06-08 ENCOUNTER — Encounter: Payer: Self-pay | Admitting: Family Medicine

## 2023-06-08 VITALS — BP 130/70 | HR 75 | Temp 98.4°F | Ht 66.0 in | Wt 223.1 lb

## 2023-06-08 DIAGNOSIS — E119 Type 2 diabetes mellitus without complications: Secondary | ICD-10-CM

## 2023-06-08 DIAGNOSIS — Z7984 Long term (current) use of oral hypoglycemic drugs: Secondary | ICD-10-CM

## 2023-06-08 LAB — POCT GLYCOSYLATED HEMOGLOBIN (HGB A1C): Hemoglobin A1C: 10.1 % — AB (ref 4.0–5.6)

## 2023-06-09 ENCOUNTER — Encounter: Payer: Self-pay | Admitting: Family Medicine

## 2023-06-09 ENCOUNTER — Other Ambulatory Visit

## 2023-06-09 NOTE — Progress Notes (Deleted)
 06/09/2023 Name: Micheal Clark MRN: [id number] DOB: [birth year]  Subjective  No chief complaint on file.   Reason for visit: ?  Micheal Clark is a 68 y.o. male with a history of diabetes (type 2), who presents today for an initial diabetes pharmacotherapy visit.? Pertinent PMH also includes HLD, tobacco use, obesity.  Known DM Complications: no known complications   Care Team: Primary Care Provider: Scherrie Curt, MD   Date of Last Diabetes Related Visit: with PCP on 06/09/23  Recent Summary of Change: ***  Medication Access/Adherence: Prescription drug coverage: Payor: HU[employer]NA MEDICARE / Plan: HU[employer]NA MEDICARE HMO / Product Type: *No Product type* / .  - Reports that all medications are not affordable.  - GLP1 agents have been cost prohibitive for him. ***  Since Last visit / History of Present Illness: ?  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   Reported DM Regimen: ?  Metformin  XR 2000 mg daily Glipizide  XL 20 mg daily at bedtime Pioglitazone  45 mg daily Saxagliptin  5 mg daily   DM medications tried in the past:?  *** (***) *** (***) *** (***)  SMBG {Blank single:19197::"Per BG meter","Per patient provided BG log","Per patient memory","***"}: ?  ***   Hypo/Hyperglycemia: ?  Symptoms of hypoglycemia since last visit:? {Blank multiple:19197::"yes","no","n/a","***"}  If yes, it was treated by: {Blank multiple:19197::"***","n/a"}  Symptoms of hyperglycemia since last visit:? {Blank multiple:19197::"yes","no","n/a","***"} - {symptoms; hyperglycemia:17903}  Reported Diet: Patient typically eats *** meals per day.  Breakfast: *** Lunch: *** Dinner: *** Snacks: *** Beverages: ***  Exercise: ***  DM Prevention:  Statin: Taking; high intensity.?  History of chronic kidney disease? no History of albuminuria?  no, last UACR on 08/19/22 = 17 mg/g ACE/ARB - Not taking; Urine MA/CR Ratio - <30 mg/g.  Last eye exam: No record; {lzdmeyeexam:31121} Last foot exam: No foot exam found Tobacco Use: Former smoker. Current use of smokeless tobacco Immunizations:? Flu: {LZDUEUPTODATE:31033} (Last: ); Pneumococcal: {LZVAXpneumococcal:31032:::0} - {LZVAXpneumo RISKAge19-64:31093}; Shingrix: {LZDUEUPTODATE:31033} (Last: , ); Covid (***)  Cardiovascular Risk Reduction History of clinical ASCVD? no History of heart failure? no History of hyperlipidemia? yes Current BMI: 36.0 kg/m2 (Ht 66 in, Wt 101.2 kg) Taking statin? yes; high intensity (atorvastatin  40 mg) Taking aspirin? not indicated; Taking   Taking SGLT-2i? no Taking GLP- 1 RA? no      _______________________________________________  Objective    Review of Systems:? Limited in the setting of virtual visit *** Constitutional:? No fever, chills or unintentional weight loss  Cardiovascular:? No chest pain or pressure, shortness of breath, dyspnea on exertion, orthopnea or LE edema  Pulmonary:? No cough or shortness of breath  GI:? No nausea, vomiting, constipation, diarrhea, abdominal pain, dyspepsia, change in bowel habits  Endocrine:? No polyuria, polyphagia or blurred vision  Psych:? No depression, anxiety, insomnia    Physical Examination:  Vitals:  Wt Readings from Last 3 Encounters:  06/08/23 223 lb 2 oz (101.2 kg)  05/18/23 226 lb (102.5 kg)  05/18/23 226 lb (102.5 kg)   BP Readings from Last 3 Encounters:  06/08/23 130/70  05/18/23 (!) 144/85  01/05/23 102/70   Pulse Readings from Last 3 Encounters:  06/08/23 75  05/18/23 71  01/05/23 72     Labs:?  Lab Results  Component Value Date   HGBA1C 10.1 (A) 06/08/2023   HGBA1C 9.0 (A) 11/02/2022   HGBA1C 10.2 (A) 09/15/2022   GLUCOSE 260 (  H) 05/18/2023   MICRALBCREAT 1.7 08/19/2022   MICRALBCREAT 0.6 04/23/2020   MICRALBCREAT 0.5 05/12/2018   CREATININE 0.76 05/18/2023    CREATININE 0.81 08/19/2022   CREATININE 0.85 04/23/2020   GFR 92.46 08/19/2022   GFR 92.62 04/23/2020   GFR 96.77 05/12/2018    Lab Results  Component Value Date   CHOL 121 12/02/2022   LDLCALC 67 12/02/2022   LDLCALC 118 (H) 08/19/2022   LDLCALC 95 04/23/2020   LDLDIRECT 68 12/02/2022   LDLDIRECT 162.9 10/11/2011   HDL 33 (L) 12/02/2022   TRIG 116 12/02/2022   TRIG 162.0 (H) 08/19/2022   TRIG 145.0 04/23/2020   ALT 17 12/02/2022   ALT 20 08/19/2022   AST 14 12/02/2022   AST 14 08/19/2022      Chemistry      Component Value Date/Time   NA 139 05/18/2023 1141   K 4.1 05/18/2023 1141   CL 105 05/18/2023 1141   CO2 25 05/18/2023 1141   BUN 11 05/18/2023 1141   CREATININE 0.76 05/18/2023 1141   CREATININE 0.86 09/29/2018 1321      Component Value Date/Time   CALCIUM  9.0 05/18/2023 1141   ALKPHOS 86 12/02/2022 1404   AST 14 12/02/2022 1404   ALT 17 12/02/2022 1404   BILITOT 0.8 12/02/2022 1404       The ASCVD Risk score (Arnett DK, et al., 2019) failed to calculate for the following reasons:   The valid total cholesterol range is 130 to 320 mg/dL  Assessment and Plan:   1. Diabetes, type 2: uncontrolled per last A1c of 10.1% (06/08/23), increased from previous 9%. Goal <7% without hypoglycemia. Not checking*** BG at home. Currently maximized on all current therapies though room for optimization of regimen with preference for agents with greater A1c-lowering propensity. GLP1-RA and/or insulin ideal. Would qualify for CGM w insulin though unsure of cost.  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 The standard was updated to  reflect more modern treatment of DM. Updated Standard 2018: The standard contains no categorical limitations or restrictions on the type of vehicle, endorsement, time  or location, or other conditions under which an individual with insulin-treated diabetes may operate a commercial motor vehicle.  The treating clinician will complete the form MCSA-5870 (DM assessment form) annually  [email or web address]://horn.com/    ***  Reviewed s/sx/tx hypoglycemia Next A1c due *** Future Consideration: GLP1-RA: Ideal agent given A1c-reduction propensity. No hx renal disease or ASCVD. Cost barrier historically.  SGLT2i: No hx CKD.  UACR has trended up though remains <30 mg/g. Reasonable agent though would defer to when closer to euglycemia d/t risk volume depletion esp w current positional vertigo concerns. Likely cost barrier.  Insulin: Ideal agent per A1c >10%. 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    Medication Access/Insurance Coverage: Deductible of $450 then brand-name medications are $47 per month thereafter according to test claim data.  Insulin = $35/month regardless of deductible  If on insulin: CGM will be covered.  CMM Pprior auth Key: [id number]  2. HTN: {CHL Controlled/Uncontrolled:[phone]} based on last clinic BP of *** mmHg (***), goal <130/80 mmHg. Does not ***  monitor BP at home. Denies lightheadedness, dizziness, SOB, CP, vision changes.  Current Regimen: *** Continue medications without changes.  BP Readings from Last 3 Encounters:  06/08/23 130/70  05/18/23 (!) 144/85  01/05/23 102/70     3. ASCVD (*** prevention): LDL {CHL DESC; PROGRESS TOWARD GOAL:21523} on last lipid panel with LDL *** mg/dL, TG *** mg/dL (***). LDL goal {ldl goal:31310}.  Key risk factors include: {cvs risk:31001} The ASCVD Risk score (Arnett DK, et al., 2019) failed to  calculate for the following reasons:   The valid total cholesterol range is 130 to 320 mg/dL indicated patient is at *** risk.  PREVENT: 10-Yr CAD ***%, 30-Yr CAD ***% indicated patient is at *** risk.  (those without ASCVD, CHF), Age 52-79 (includes additional metabolic risk factors, BMI, GFR, UACR, no race) Current Regimen: *** mg daily Continue medications today without changes.  Lab Results  Component Value Date   LDLCALC 67 12/02/2022   LDLCALC 118 (H) 08/19/2022   LDLDIRECT 68 12/02/2022   LDLDIRECT 162.9 10/11/2011   TRIG 116 12/02/2022   TRIG 162.0 (H) 08/19/2022     4. Healthcare Maintenance:  Pneumococcal - Current status: ***  Shingles - Current status: *** Influenza - Current status: ***  Due to receive the following vaccines: {LZAPIMMUNIZATION:31028}   Follow Up Follow up with clinical pharmacist via *** in *** weeks Patient given direct line for questions regarding medication therapy and MAP applications***  Future Appointments  Date Time Provider Department Center  06/09/2023  2:00 PM [facility] PHARMACIST [facility]  06/13/2023 10:05 AM Dunn, Elvia Hammans, PA-C [facility] None    Daron Ellen, PharmD Clinical Pharmacist [facility] [phone]

## 2023-06-10 DIAGNOSIS — R42 Dizziness and giddiness: Secondary | ICD-10-CM | POA: Diagnosis not present

## 2023-06-10 DIAGNOSIS — H6123 Impacted cerumen, bilateral: Secondary | ICD-10-CM | POA: Diagnosis not present

## 2023-06-13 ENCOUNTER — Encounter: Payer: Self-pay | Admitting: Physician Assistant

## 2023-06-13 ENCOUNTER — Other Ambulatory Visit

## 2023-06-13 ENCOUNTER — Ambulatory Visit: Attending: Physician Assistant | Admitting: Physician Assistant

## 2023-06-13 VITALS — BP 130/78 | HR 70 | Ht 68.0 in | Wt 224.6 lb

## 2023-06-13 DIAGNOSIS — I2510 Atherosclerotic heart disease of native coronary artery without angina pectoris: Secondary | ICD-10-CM

## 2023-06-13 DIAGNOSIS — R42 Dizziness and giddiness: Secondary | ICD-10-CM | POA: Diagnosis not present

## 2023-06-13 DIAGNOSIS — E785 Hyperlipidemia, unspecified: Secondary | ICD-10-CM

## 2023-06-13 NOTE — Progress Notes (Signed)
 Cardiology Office Note    Date:  06/13/2023   ID:  Micheal Clark, DOB 08/17/1955, MRN [id number]  PCP:  Scherrie Curt, MD  Cardiologist:  Constancia Delton, MD  Electrophysiologist:  None   Chief Complaint: Follow up  History of Present Illness:   Micheal Clark is a 68 y.o. male with history of nonobstructive CAD by coronary CTA in 08/2022, DM2, HLD, and prior tobacco use who presents for follow-up of CAD.   He was evaluated by Dr. Nolan Battle in 09/2015 for exertional shortness of breath.  Echo in 09/2015 showed an EF of 60 to 65%, mild concentric LVH, no regional wall motion abnormalities, normal LV diastolic function parameters, and normal RVSP.  Treadmill MPI in 09/2015 showed no evidence of ischemia, and was overall low risk with an EF of 55 to 65%.   He was evaluated by Dr. Junnie Olives in 08/2022 with exertional shortness of breath over the preceding 3 to 4 weeks.  Coronary CTA on 09/15/2022 showed a calcium  score of 1666, which was the 96th percentile.  There was 25 to 49% stenosis in the proximal LAD and RCA along with 50 to 69% stenosis in the PDA.  ctFFR along the LAD and LCx at 0.88 and 0.9 respectively with a ctFFR of 0.77 in a nondominant RCA with medical management recommended.  Echo on 09/23/2022 showed an EF of 55 to 60%, no regional wall motion abnormalities, grade 1 diastolic dysfunction, normal RV systolic function and ventricular cavity size, normal RVSP, mildly dilated left atrium, mild mitral regurgitation, and an estimated right atrial pressure of 3 mmHg.  He was seen in follow-up in 11/2022 and was without symptoms of angina, cardiac decompensation, or further dyspnea.  Symptoms of palpitations had also resolved.  No further testing was indicated at that time.  He was seen in the ED on 05/18/2023 with a sensation of dizziness/being off balance that started 2 days prior.  He reported his blood sugars were running in the 200s.  Glucose in the ED of 260.  EKG showed sinus rhythm  with known bifascicular block.  MRI of the brain that showed no acute findings.  He was treated with meclizine  for vertigo.  He has since followed up with ENT and been diagnosed with inner ear disorder with recommendation for manipulation exercises.  He comes in doing well from a cardiac perspective and is without symptoms of angina or cardiac decompensation.  Dizziness is much improved.  He reported these episodes were a sensation of imbalance and would last for 1 to 2 seconds with spontaneous resolution.  No palpitations, presyncope, or syncope.  No lower extremity swelling or progressive orthopnea.  No falls or symptoms concerning for bleeding.   Labs independently reviewed: 04/2023 - Hgb 14.6, PLT 210, potassium 4.1, BUN 11, serum creatinine 0.76 07/2022 - direct LDL 68, TC 121, TG 116, HDL 33, albumin 4.2, AST/ALT normal, TSH normal  Past Medical History:  Diagnosis Date   CAD (coronary artery disease)    Diabetes type 2, uncontrolled 11/15/2011   Hyperlipidemia 11/15/2011   Personal history of adenomatous colonic polyps 11/24/2011   Smokeless tobacco use 10/11/2011    Past Surgical History:  Procedure Laterality Date   ORIF ACETABULAR FRACTURE Left 1998   left   TOTAL HIP ARTHROPLASTY Left 2021   WISDOM TOOTH EXTRACTION      Current Medications: Current Meds  Medication Sig   aspirin 81 MG tablet Take 81 mg by mouth daily.   atorvastatin  (LIPITOR) 40  MG tablet Take 1 tablet (40 mg total) by mouth daily.   cyanocobalamin (VITAMIN B12) 1000 MCG tablet Take 1,000 mcg by mouth daily.   glipiZIDE  (GLUCOTROL  XL) 10 MG 24 hr tablet TAKE TWO TABLETS BY MOUTH AT BEDTIME   meclizine  (ANTIVERT ) 25 MG tablet Take 1 tablet (25 mg total) by mouth 3 (three) times daily as needed for dizziness.   metFORMIN  (GLUCOPHAGE -XR) 500 MG 24 hr tablet TAKE FOUR TABLETS BY MOUTH EVERY MORNING WITH FOOD   pioglitazone  (ACTOS ) 45 MG tablet Take 1 tablet (45 mg total) by mouth daily.   saxagliptin  HCl  (ONGLYZA) 5 MG TABS tablet TAKE 1 TABLET BY MOUTH EVERY DAY    Allergies:   Patient has no known allergies.   Social History   Socioeconomic History   Marital status: Married    Spouse name: Not on file   Number of children: 2   Years of education: Not on file   Highest education level: Not on file  Occupational History   Occupation: [employer]    Employer: [employer]     Comment: truck driver  Tobacco Use   Smoking status: Former    Current packs/day: 0.00    Average packs/day: 1 pack/day for 8.0 years (8.0 ttl pk-yrs)    Types: Cigarettes    Start date: 03/08/1972    Quit date: 03/08/1980    Years since quitting: 43.2   Smokeless tobacco: Current    Types: Chew   Tobacco comments:    quit 1993  Vaping Use   Vaping status: Never Used  Substance and Sexual Activity   Alcohol use: Yes    Comment: occassionally-not wekly   Drug use: No   Sexual activity: Not on file  Other Topics Concern   Not on file  Social History Narrative   Not on file   Social Drivers of Health   Financial Resource Strain: Low Risk  (03/10/2020)   Received [facility], [facility]   Overall Financial Resource Strain (CARDIA)    Difficulty of Paying Living Expenses: Not very hard  Food Insecurity: No Food Insecurity (03/07/2020)   [facility]   Hunger Vital Sign    Worried About Running Out of Food in the Last Year: Never true    Ran Out of Food in the Last Year: Never true  Transportation Needs: Not on file  Physical Activity: Not on file  Stress: No Stress Concern Present (03/10/2020)   Received from [facility], [facility]   [employer] [facility]ational [facility] - Occupational Stress Questionnaire    Feeling of Stress : Only a little  Social Connections: Unknown (06/08/2021)   Received from [facility], [facility]   Social Network    Social Network: Not on file     Family History:  The patient's family history includes  Diabetes in his mother; Hypertension in his brother; Vision loss in his paternal grandfather. There is no history of Colon cancer, Rectal cancer, or Stomach cancer.  ROS:   12-point review of systems is negative unless otherwise noted in the HPI.   EKGs/Labs/Other Studies Reviewed:    Studies reviewed were summarized above. The additional studies were reviewed today:  2D echo 09/23/2022: 1. Left ventricular ejection fraction, by estimation, is 55 to 60%. The  left ventricle has normal function. The left ventricle has no regional  wall motion abnormalities. Left ventricular diastolic parameters are  consistent with Grade I diastolic  dysfunction (impaired relaxation).  2. Right ventricular systolic function is normal. The right ventricular  size is normal. There is normal pulmonary artery systolic pressure. The  estimated right ventricular systolic pressure is 21.2 mmHg.   3. Left atrial size was mildly dilated.   4. The mitral valve is normal in structure. Mild mitral valve  regurgitation. No evidence of mitral stenosis.   5. The aortic valve has an indeterminant number of cusps. Aortic valve  regurgitation is not visualized. No aortic stenosis is present.   6. The inferior vena cava is normal in size with greater than 50%  respiratory variability, suggesting right atrial pressure of 3 mmHg.  __________   Coronary CTA 09/15/2022: FINDINGS: Aorta:  Normal size.  Aortic wall calcifications.  No dissection.   Aortic Valve:  Trileaflet.  No calcifications.   Coronary Arteries:  Normal coronary origin. Left dominance.   RCA is a nondominant artery. There is calcified plaque proximally causing mild stenosis (25-49%).   Left main gives rise to LAD and LCX arteries. LM has no disease.   LAD has calcified plaque proximally causing mild stenosis (25-49%).   LCX is a dominant artery. There is calcified plaque distally causing mild stenosis (moderate stenosis 50-69%).   Other  findings:   Normal pulmonary vein drainage into the left atrium.   Normal left atrial appendage without a thrombus.   Normal size of the pulmonary artery.   IMPRESSION: 1. Coronary calcium  score of 1666. This was 96th percentile for age and sex matched control. 2. Normal coronary origin with left dominance. 3. Calcified plaque in the distal LCx/prox L. PDA causing moderate stenosis (50-69%). 4. Mild proximal LAD and RCA stenosis (25-49%). 5. CAD-RADS 3. Moderate stenosis. Consider symptom-guided anti-ischemic pharmacotherapy as well as risk factor modification per guideline directed care. 6. Additional analysis with CT FFR will be submitted and reported separately.     ctFFR: 1. Left Main:  No significant stenosis. 2. LAD: No significant stenosis.  FFRct 0.88 3. LCX: No significant stenosis.  FFRct 0.9 4. RCA: significant stenosis, FFRct 0.77.  Non dominant artery   IMPRESSION: 1. CT FFR analysis showed significant stenosis in the RCA, non dominant artery. 2.  Recommend medical management. __________   Treadmill MPI 10/13/2015: Blood pressure demonstrated a normal response to exercise. There was no ST segment deviation noted during stress. No T wave inversion was noted during stress. The study is normal. This is a low risk study. The left ventricular ejection fraction is normal (55-65%). __________   2D echo 10/10/2015: - Left ventricle: The cavity size was normal. There was mild    concentric hypertrophy. Systolic function was normal. The    estimated ejection fraction was in the range of 60% to 65%. Wall    motion was normal; there were no regional wall motion    abnormalities. Left ventricular diastolic function parameters    were normal.  - Pulmonary arteries: Systolic pressure was within the normal    range.    EKG:  EKG is not ordered today.    Recent Labs: 08/19/2022: TSH 2.74 12/02/2022: ALT 17 05/18/2023: BUN 11; Creatinine, Ser 0.76; Hemoglobin 14.6;  Platelets 210; Potassium 4.1; Sodium 139  Recent Lipid Panel    Component Value Date/Time   CHOL 121 12/02/2022 1404   TRIG 116 12/02/2022 1404   HDL 33 (L) 12/02/2022 1404   CHOLHDL 3.7 12/02/2022 1404   CHOLHDL 4 08/19/2022 1252   VLDL 32.4 08/19/2022 1252   LDLCALC 67 12/02/2022 1404   LDLCALC 110 (  H) 09/29/2018 1321   LDLDIRECT 68 12/02/2022 1404   LDLDIRECT 162.9 10/11/2011 1445    PHYSICAL EXAM:    VS:  BP 130/78   Pulse 70   Ht 5\' 8"  (1.727 m)   Wt 224 lb 9.6 oz (101.9 kg)   SpO2 98%   BMI 34.15 kg/m   BMI: Body mass index is 34.15 kg/m.  Physical Exam Vitals reviewed.  Constitutional:      Appearance: He is well-developed.  HENT:     Head: Normocephalic and atraumatic.  Eyes:     General:        Right eye: No discharge.        Left eye: No discharge.  Cardiovascular:     Rate and Rhythm: Normal rate and regular rhythm.     Heart sounds: Normal heart sounds, S1 normal and S2 normal. Heart sounds not distant. No midsystolic click and no opening snap. No murmur heard.    No friction rub.  Pulmonary:     Effort: Pulmonary effort is normal. No respiratory distress.     Breath sounds: Normal breath sounds. No decreased breath sounds, wheezing, rhonchi or rales.  Chest:     Chest wall: No tenderness.  Musculoskeletal:     Cervical back: Normal range of motion.     Right lower leg: No edema.     Left lower leg: No edema.  Skin:    General: Skin is warm and dry.     Nails: There is no clubbing.  Neurological:     Mental Status: He is alert and oriented to person, place, and time.  Psychiatric:        Speech: Speech normal.        Behavior: Behavior normal.        Thought Content: Thought content normal.        Judgment: Judgment normal.     Wt Readings from Last 3 Encounters:  06/13/23 224 lb 9.6 oz (101.9 kg)  06/08/23 223 lb 2 oz (101.2 kg)  05/18/23 226 lb (102.5 kg)     ASSESSMENT & PLAN:   Nonobstructive CAD: He continues to do well from a  cardiac perspective and is without symptoms of angina.  No further dyspnea.  Continue aggressive risk factor modification and primary prevention including aspirin 81 mg and atorvastatin  40 mg.  No indication for further ischemic testing at this time.  HLD: LDL 68 in 07/2022 with normal AST/ALT at that time.  He remains on atorvastatin  40 mg.  Dizziness: Much improved.  No sensation of palpitations, presyncope, or syncope.  Given improvement in symptoms defer outpatient cardiac monitoring at this time.  He will let us  know if symptoms return.  Recent echo without significant structural abnormality.    Disposition: F/u with Dr. Junnie Olives or an APP in 6 months.   Medication Adjustments/Labs and Tests Ordered: Current medicines are reviewed at length with the patient today.  Concerns regarding medicines are outlined above. Medication changes, Labs and Tests ordered today are summarized above and listed in the Patient Instructions accessible in Encounters.   Signed, Varney Gentleman, PA-C 06/13/2023 10:58 AM     [facility] [place] [address] [place], [place] [address] [phone]

## 2023-06-13 NOTE — Patient Instructions (Signed)
 Medication Instructions:  Your physician recommends that you continue on your current medications as directed. Please refer to the Current Medication list given to you today.   *If you need a refill on your cardiac medications before your next appointment, please call your pharmacy*  Lab Work: No labs ordered today   Testing/Procedures: No test ordered today   Follow-Up: At [facility], you and your health needs are our priority.  As part of our continuing mission to provide you with exceptional heart care, our providers are all part of one team.  This team includes your primary Cardiologist (physician) and Advanced Practice Providers or APPs (Physician Assistants and Nurse Practitioners) who all work together to provide you with the care you need, when you need it.  Your next appointment:   6 month(s)  Provider:   You may see Constancia Delton, MD or one of the following Advanced Practice Providers on your designated Care Team:   Laneta Pintos, NP Gildardo Labrador, PA-C Varney Gentleman, PA-C Cadence Marquette, PA-C Ronald Cockayne, NP Morey Ar, NP    We recommend signing up for the patient portal called "MyChart".  Sign up information is provided on this After Visit Summary.  MyChart is used to connect with patients for Virtual Visits (Telemedicine).  Patients are able to view lab/test results, encounter notes, upcoming appointments, etc.  Non-urgent messages can be sent to your provider as well.   To learn more about what you can do with MyChart, go to [email or web address].

## 2023-06-14 ENCOUNTER — Telehealth: Payer: Self-pay

## 2023-06-14 NOTE — Progress Notes (Signed)
 Complex Care Management Care Guide Note  06/14/2023 Name: RILEE WENDLING MRN: [id number] DOB: July 07, 1955  BLADYN TIPPS is a 68 y.o. year old male who is a primary care patient of Copland, Jolena Nay, MD and is actively engaged with the care management team. I reached out to Coye Diver by phone today to assist with re-scheduling  with the Pharmacist.  Follow up plan: Telephone appointment with complex care management team member scheduled for:  06/16/2023  Lenton Rail , RMA     [place]  [facility], [facility] Guide  Direct Dial: [phone]  Website: [email or web address]

## 2023-06-15 ENCOUNTER — Other Ambulatory Visit

## 2023-06-16 ENCOUNTER — Other Ambulatory Visit (INDEPENDENT_AMBULATORY_CARE_PROVIDER_SITE_OTHER): Admitting: Pharmacist

## 2023-06-16 DIAGNOSIS — E119 Type 2 diabetes mellitus without complications: Secondary | ICD-10-CM

## 2023-06-16 NOTE — Progress Notes (Unsigned)
 06/23/2023 Name: Micheal Clark MRN: [id number] DOB: 1955/03/18  Subjective  Chief Complaint  Patient presents with   Diabetes   Medication Access    Reason for visit: ?  Micheal Clark is a 68 y.o. male with a history of diabetes (type 2), who presents today for an initial diabetes pharmacotherapy visit.? Pertinent PMH also includes HLD, tobacco use, obesity.  Known DM Complications: no known complications   Care Team: Primary Care Provider: Scherrie Curt, MD   Date of Last Diabetes Related Visit: with PCP on 06/09/23   Medication Access/Adherence: Prescription drug coverage: Payor: HU[employer]NA MEDICARE / Plan: HUMANA MEDICARE HMO / Product Type: *No Product type* / .  - Reports that all medications are not affordable.  - GLP1 agents have been cost prohibitive. Ozempic  cost at his pharmacy was ~$500 for the first month.  - Household size 2: just under 500% FPL  Since Last visit / History of Present Illness: ?  Patient reports doing well on current therapies. No adverse effects or other concerns. Does not check BG at home though has a glucometer to use as needed. Notes when he does check intermittently. BG typically >200 mg/dL.   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 Notes his mother was on insulin for decades so he is quite familiar/comfortable with the though of insulin if needed.     Reported DM Regimen: ?  Metformin  XR 2000 mg daily - $0Glipizide  XL 20 mg daily at bedtime - $0Pioglitazone  45 mg daily - $0 Saxagliptin  5 mg daily - $120/month    DM medications tried in the past:?  N/A  SMBG: glucometer prn   Hypo/Hyperglycemia: ?  Symptoms of hypoglycemia since last visit:? no  If yes, it was treated by: n/a  Symptoms of hyperglycemia since last visit:? no   Reported Diet: Patient typically eats 2 meals per day.  Breakfast:  Skips Lunch: Typically eats out - vegetable plate, salad, hamburger Dinner: Corn and beans with meat (chicken, pork)  Beverages: Sweet Tea (dilutes with water) - Drinks 1 gallon per week (~300-400 g sugar)  Exercise: No formal exercise.   DM Prevention:  Statin: Taking; high intensity.?  History of chronic kidney disease? no History of albuminuria? no, last UACR on 08/19/22 = 17 mg/g ACE/ARB - Not taking; Urine MA/CR Ratio - <30 mg/g.  Last eye exam: No record Last foot exam: No foot exam found Tobacco Use: Former smoker. Current use of smokeless tobacco  Cardiovascular Risk Reduction History of clinical ASCVD? no History of heart failure? no History of hyperlipidemia? yes Current BMI: 36.0 kg/m2 (Ht 66 in, Wt 101.2 kg) Taking statin? yes; high intensity (atorvastatin  40 mg) Taking aspirin? not indicated; Taking   Taking SGLT-2i? no Taking GLP- 1 RA? no      _______________________________________________  Objective    Review of Systems:? Limited in the setting of virtual visit  GI:? No nausea, vomiting, constipation, diarrhea, abdominal pain, dyspepsia, change in bowel habits  Endocrine:? No polyuria, polyphagia or blurred vision    Physical Examination:  Vitals:  Wt Readings from Last 3 Encounters:  06/13/23 224 lb 9.6 oz (101.9 kg)  06/08/23 223 lb 2 oz (101.2 kg)  05/18/23 226 lb (102.5 kg)   BP Readings from Last 3 Encounters:  06/13/23 130/78  06/08/23 130/70  05/18/23 (!) 144/85   Pulse Readings from Last 3 Encounters:  06/13/23 70  06/08/23 75  05/18/23 71     Labs:?  Lab Results  Component Value Date   HGBA1C 10.1 (A) 06/08/2023   HGBA1C 9.0 (A) 11/02/2022   HGBA1C 10.2 (A) 09/15/2022   GLUCOSE 260 (H) 05/18/2023   MICRALBCREAT 1.7 08/19/2022   MICRALBCREAT 0.6 04/23/2020   MICRALBCREAT 0.5 05/12/2018   CREATININE 0.76 05/18/2023   CREATININE 0.81 08/19/2022   CREATININE 0.85 04/23/2020   GFR 92.46 08/19/2022   GFR 92.62 04/23/2020   GFR 96.77  05/12/2018    Lab Results  Component Value Date   CHOL 121 12/02/2022   LDLCALC 67 12/02/2022   LDLCALC 118 (H) 08/19/2022   LDLCALC 95 04/23/2020   LDLDIRECT 68 12/02/2022   LDLDIRECT 162.9 10/11/2011   HDL 33 (L) 12/02/2022   TRIG 116 12/02/2022   TRIG 162.0 (H) 08/19/2022   TRIG 145.0 04/23/2020   ALT 17 12/02/2022   ALT 20 08/19/2022   AST 14 12/02/2022   AST 14 08/19/2022      Chemistry      Component Value Date/Time   NA 139 05/18/2023 1141   K 4.1 05/18/2023 1141   CL 105 05/18/2023 1141   CO2 25 05/18/2023 1141   BUN 11 05/18/2023 1141   CREATININE 0.76 05/18/2023 1141   CREATININE 0.86 09/29/2018 1321      Component Value Date/Time   CALCIUM  9.0 05/18/2023 1141   ALKPHOS 86 12/02/2022 1404   AST 14 12/02/2022 1404   ALT 17 12/02/2022 1404   BILITOT 0.8 12/02/2022 1404     The ASCVD Risk score (Arnett DK, et al., 2019) failed to calculate for the following reasons:   The valid total cholesterol range is 130 to 320 mg/dL  Assessment and Plan:   1. Diabetes, type 2: uncontrolled per last A1c of 10.1% (06/08/23), increased from previous 9%. Goal <7% without hypoglycemia. Not checking BG at home. Currently maximized on all current therapies though room for optimization of regimen with preference for agents with greater A1c-lowering propensity. GLP1-RA and/or insulin ideal. Would qualify for CGM w insulin though unsure of cost.  Current Regimen: MTF XR 2000 mg/d, glipizide  XL 20 mg/d, pioglitazone  45 mg/d, saxagliptin  5 mg/d Recommendations/Considerations: Brand medications including GLP1RA and SGLT2i: Cost-prohibitive at this time  Consider basal insulin once daily + Libre 3+ CGM  Ex. 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 The standard was updated to reflect more modern  treatment of DM. Updated Standard 2018: The standard contains no categorical limitations or restrictions on the type of vehicle, endorsement, time  or location, or other conditions under which an individual with insulin-treated diabetes may operate a commercial motor vehicle.  The treating clinician will complete the form MCSA-5870 (DM assessment form) annually  [email or web address]://horn.com/   Future Consideration: GLP1-RA: Ideal agent given A1c-reduction propensity. No hx renal disease or ASCVD. Cost barrier historically.  SGLT2i: No hx CKD.  UACR has trended up though remains <30 mg/g. Reasonable agent though would defer to when closer to euglycemia d/t risk volume depletion esp w current positional vertigo concerns. Likely cost barrier.  Insulin: Ideal agent per A1c >10%. 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 Medication Access/Insurance Coverage: $450 deductible then $47 monthly thereafter. Deductible is a barrier Income for household of 2 is just under 500%  FPL. Does not meet eligibility criteria for patient assistance.  Not eligible for copay cards given government insurance  Insulin = $35/month regardless of deductible  If on insulin: CGM will be covered.  CMM Prior auth Key: [id number] - APPROVED   Follow Up Follow up with clinical pharmacist as needed pending PCP med therapy plan  Patient given direct line for questions regarding medication therapy  No future appointments.   Daron Ellen, PharmD Clinical Pharmacist [facility] [phone]

## 2023-06-23 ENCOUNTER — Other Ambulatory Visit (INDEPENDENT_AMBULATORY_CARE_PROVIDER_SITE_OTHER): Payer: Self-pay | Admitting: Pharmacist

## 2023-06-23 DIAGNOSIS — E1165 Type 2 diabetes mellitus with hyperglycemia: Secondary | ICD-10-CM

## 2023-06-23 NOTE — Addendum Note (Signed)
 Addended by: Daron Ellen on: 06/23/2023 02:28 PM   Modules accepted: Orders

## 2023-06-23 NOTE — Progress Notes (Addendum)
 Diabetes Follow Up Reason for Call: Medication cost consideration   Summary: Tresiba U100 - $35 per month through insurance, confirmed.  Dexcom G7 - Approved PA though looks as though deductible applies. After deductible, appears to be almost fully covered ~$3/month in test claim  Reasonable to send prescription to home pharmacy to see if they can tell cost through Part B/C. Notably, patient is paying $120 for his saxagliptin  and therefore should theoretically reach his deductible soon. However, today patient reports he has been purchasing with GoodRx as insurance price was ~$160 so this has not been counting toward his deductible.  Patient agreeable to Dexcom prescription to verify cost. PharmD will follow up with pharmacy  Reports he has basaglar  at home from previous prescritpion though never used it. Expires July 14, 2023.     Follow Up: Patient given direct line for further questions/concerns.  Daron Ellen, PharmD Clinical Pharmacist [facility] [phone]

## 2023-06-24 MED ORDER — TRESIBA FLEXTOUCH 100 UNIT/ML ~~LOC~~ SOPN: PEN_INJECTOR | SUBCUTANEOUS | 0 refills | Status: DC

## 2023-06-24 MED ORDER — DEXCOM G7 SENSOR MISC: 11 refills | Status: DC

## 2023-06-28 ENCOUNTER — Encounter: Payer: Self-pay | Admitting: Pharmacist

## 2023-06-28 NOTE — Progress Notes (Signed)
 Brief Telephone Documentation Reason for Call: Follow up on medication cost  Summary of Call: Called patient's pharmacy. They report that the Dexcom claim is going through for $302 x 1 month of sensors due to medication deductible.   [facility] test claim showing $300 deductible, then a copay amount of ~$3 thereafter.  However, deductible was previously the main barrier to GLP1 therapy. 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   Currently paying >$100 forsaxagliptin . Could consider swapping saxagliptin  for insulin/Dexcom at least for the remainder of 2025. Libre copay card may bring cost to $75/month + $35/month for insulin = $110 which is still cheaper than saxagliptin . Though, will need to logistically discuss a plan for what next year w insurance would look like (plan to pay deductible vs alternative).   Patient messaged via MyChart. Will f/u via telephone to discuss next steps/options.   Daron Ellen, PharmD Clinical Pharmacist [facility] [phone]

## 2023-07-01 ENCOUNTER — Other Ambulatory Visit (INDEPENDENT_AMBULATORY_CARE_PROVIDER_SITE_OTHER): Payer: Self-pay | Admitting: Pharmacist

## 2023-07-01 ENCOUNTER — Encounter: Payer: Self-pay | Admitting: Pharmacist

## 2023-07-01 DIAGNOSIS — E119 Type 2 diabetes mellitus without complications: Secondary | ICD-10-CM

## 2023-07-01 NOTE — Patient Instructions (Signed)
 Mr. Joanathan Affeldt [facility],   It was a pleasure to speak with you today! As we discussed:?  We have initiated your application for the following medication assistance program:   [employer] Patient Assistance [facility]) Medication: Ozempic  [employer] [address], [place], [place] [address]  Phone: [phone] M-F 8am-8pm ET    Fax: [phone]   What to Expect The program typically reviews applications within 3 business days but may require additional time during periods of higher volume, especially during January and February. Once reviewed, the program will send you and your doctor a letter informing you of your approval or denial. If approved, your letter will explain how you will receive your medicine. This program ships the medication to your doctor's office in 20-month quantities. This is a free service.  Your enrollment would last through the end of the calendar year. At the end of the year, we would re-apply for the full 2026 calendar year.    For any questions after your application has been submitted,  Please contact the program directly by phone. To Check your application status For Questions regarding your medication shipment or delivery details To see when your next medication refill will be shipped out To request medication refills   Daron Ellen, PharmD Clinical Pharmacist [facility] [phone]

## 2023-07-01 NOTE — Progress Notes (Signed)
 Patient Assistance Program (PAP) Application   Manufacturer: [employer]    (New enrollment) Medication(s): Ozempic   Patient Portion of Application:  07/01/23: Completed with patient via online enrollment tool. Submitted.  Income Documentation: N/A - Electronic verification elected.  Provider Portion of Application:  07/01/23: Discussed with MD. Submitted online.  Prescription(s): Included in MAP application. Ozempic  0.25 mg (1 box) Ozempic  0.5 mg (1 box) Ozempic  1.0 mg (1 box) Ozempic  2.0 mg (1 box)  Application uploaded to media tab

## 2023-07-01 NOTE — Progress Notes (Signed)
 Brief Telephone Documentation Reason for Call: Patient returning missed call from pharmacist   Summary of Call: Patient's Rx deductible does apply to his CGM sensors, making the first fill quite expensive. Since he has been using GoodRx for his saxagltiptin, he has paid virtually nothing toward his medication deductible.   Deductible: 448.77 the $47/month for brand medication (such as Ozempic )  Although estimated income exceeded 400% FPL, the Novo Cares portal estimates that he does meet eligibility criteria based on their income search tool.  Have had many patients successfully approved in this situation. Patient is agreeable to trying for online Novo enrollment. Worst case he is denied. Best case, will have access to Ozempic  and insulin in the future as needed.    Plan: Novo PAP pulling lower income totals with their screening tool and estimate patient may be eligible for enrollment.  Will attempt PAP application for Ozempic  - if denied will continue with insulin though CGM is a cost barrier. Patient has strong preference to avoid mandatory finger sticks.   If approved for Novo PAP: - Start Ozempic  w titration q4 weeks. Stop saxagliptin .  - Will also discontinue insulin Rx if able to get Ozempic . Patient has not started/purchased insulin yet.   Future Consideration: - In 2026, patient may benefit from [facility] payment plan. Initial brand medication fill = $450 which eliminates his deductible immediately in Jan. Payment plan would split this deductible cost over 12 months > $37/month. Significantly less cost than current GoodRx payment for saxaglitpiin which exceeds $100/mo.    Daron Ellen, PharmD Clinical Pharmacist [facility] [phone]

## 2023-07-07 ENCOUNTER — Encounter: Payer: Self-pay | Admitting: Pharmacist

## 2023-07-07 NOTE — Progress Notes (Signed)
 Patient has been APPROVED for Novo PAP.  Enrollment valid through 01/25/2024.   First shipment: In the process of being shipped, not yet shipped as of 07/07/23

## 2023-07-08 ENCOUNTER — Telehealth: Payer: Self-pay | Admitting: *Deleted

## 2023-07-08 ENCOUNTER — Other Ambulatory Visit (INDEPENDENT_AMBULATORY_CARE_PROVIDER_SITE_OTHER): Payer: Self-pay | Admitting: Pharmacist

## 2023-07-08 DIAGNOSIS — E119 Type 2 diabetes mellitus without complications: Secondary | ICD-10-CM

## 2023-07-08 NOTE — Progress Notes (Signed)
 Brief Telephone Documentation Reason for Call: Patient left message regarding question for pharmacist   Summary of Call: Called patient 6/13. No answer, left voicemail with direct callback number.  Patient returned my call.  Patient states he was calling to inquire about updates for his Novo application.  We discussed his PAP approval and next steps. He was sent a Mychart message yesterday with full details. He verbalizes understanding.   Patient aware he will receive a call from clinic once his first shipment arrives.  We discussed the titration schedule. Patient confirms understanding, he will start with the red box at 0.25 mg one day per week.   Follow Up: Patient given direct line for further questions/concerns. Will f/u 4 weeks (Ozempic  delivery expected 1.5-2 weeks from now)  Daron Ellen, PharmD Clinical Pharmacist [facility] [phone]

## 2023-07-08 NOTE — Telephone Encounter (Signed)
 Copied from CRM [id number]. Topic: General - Other >> Jul 08, 2023 10:30 AM Turkey A wrote: Reason for CRM: Patient would like for Gurney Lefort to give him a call back-agent asked in regards to what-patient said Gurney Lefort should know

## 2023-07-08 NOTE — Telephone Encounter (Signed)
 I think Mr. Celaya is referring to Llana Rile our pharmacist.  Will forward message to her.

## 2023-07-12 ENCOUNTER — Telehealth: Payer: Self-pay

## 2023-07-12 ENCOUNTER — Other Ambulatory Visit: Payer: Self-pay | Admitting: Pharmacist

## 2023-07-12 NOTE — Telephone Encounter (Signed)
 Copied from CRM [id number]. Topic: Clinical - Medication Question >> Jul 12, 2023  9:59 AM Magdalene School wrote: Reason for CRM: Patient is requesting a callback from pharmacist Loris Ros because he received a letter in the mail regarding program to help him get his medication at a lower cost.

## 2023-07-12 NOTE — Progress Notes (Signed)
 Brief Telephone Documentation Reason for Call: Patient left message regarding question for pharmacist  Summary of Call: Returned patient's call 07/12/23 States he received 2 letters in the mail. One of program approval, the other was an application so he was confused.  Approval letter dated after the application letter.  Discussed with patient the following information via my discussion today with   Called [employer]. Confirmed again his application has been approved through 01/25/24. However, order status still listed as in process.   Spoke with Novo representative who states:  Acceptance date: 07/04/2023 Shipment started processing 07/06/23.  Latest shipment date: 07/28/23 (10-14 business days)  Follow Up: Patient given direct line for further questions/concerns.  Daron Ellen, PharmD Clinical Pharmacist [facility] [phone]

## 2023-07-22 ENCOUNTER — Telehealth: Payer: Self-pay | Admitting: Family Medicine

## 2023-07-22 NOTE — Telephone Encounter (Signed)
 Patients wife picked up medication.

## 2023-07-22 NOTE — Telephone Encounter (Addendum)
 Received from [employer] PAP:  Ozempic  0.25,0.5 ml Lot# [id number] Exp: 12/24/2025 x 2 boxes.  Ozempic  4 mg/3 ml Lot# [id number] Exp: 11/24/2025 x 1 box.  Ozempic  8 mg/3 ml Lot# [id number] Exp: 11/24/2025 x 1 box.  Micheal Clark notified by telephone that medication is ready to be picked up at our office.

## 2023-07-26 ENCOUNTER — Other Ambulatory Visit (INDEPENDENT_AMBULATORY_CARE_PROVIDER_SITE_OTHER): Admitting: Pharmacist

## 2023-07-26 ENCOUNTER — Encounter: Payer: Self-pay | Admitting: Pharmacist

## 2023-07-26 DIAGNOSIS — E119 Type 2 diabetes mellitus without complications: Secondary | ICD-10-CM

## 2023-07-26 NOTE — Progress Notes (Signed)
 Brief Telephone Documentation Reason for Call: Follow up, new Ozempic  start  Summary of Call: Called patient 7/1. Patient confirms his wife picked up Ozempic  from clinic (Novo PAP). He recalls there being 3 boxes though will check fridge tonight (should have 4 boxes total).   Patient notes he started Ozempic  on Sunday evening though states he turned the dial 1 click. Did not see a number in the dosing window.  We reviewed the Ozempic  pen in detail over the phone. Discussed that he likely injected no medicine (or a negligible amount). He will either start 0.25 mg tonight or this upcoming Sunday if preferred to keep Sunday as injection day.   Discussed starting dose of 0.25 mg weekly for 4 weeks total, then increase to 0.5 mg weekly dose.   Follow Up: Patient given direct line for further questions/concerns.  Manuelita FABIENE Kobs, PharmD Clinical Pharmacist [facility] [phone]

## 2023-08-02 ENCOUNTER — Other Ambulatory Visit: Payer: Self-pay | Admitting: Pharmacist

## 2023-08-02 NOTE — Progress Notes (Signed)
 Brief Telephone Documentation Reason for Call: Patient called regarding question about mediation cost  Summary of Call: Patient had questions regarding price of Dexcom CGM sensors discussed last month.   Reviewed that since we did not end up starting insulin (as he was approved for Ozempic  PAP), we can forego the CGM sensors at this time. Do not expect him to monitor his blood sugar continuously if not taking insulin. Patient verbalizes understanding.   Saxagliptin  removed from medication list. New Ozempic  start.   Follow Up: Patient given direct line for further questions/concerns.  Manuelita FABIENE Kobs, PharmD Clinical Pharmacist [facility] [phone]

## 2023-08-05 ENCOUNTER — Other Ambulatory Visit

## 2023-08-22 ENCOUNTER — Telehealth: Payer: Self-pay | Admitting: *Deleted

## 2023-08-22 ENCOUNTER — Other Ambulatory Visit (INDEPENDENT_AMBULATORY_CARE_PROVIDER_SITE_OTHER): Payer: Self-pay | Admitting: Pharmacist

## 2023-08-22 DIAGNOSIS — E119 Type 2 diabetes mellitus without complications: Secondary | ICD-10-CM

## 2023-08-22 DIAGNOSIS — Z794 Long term (current) use of insulin: Secondary | ICD-10-CM

## 2023-08-22 NOTE — Telephone Encounter (Signed)
 Copied from CRM [id number]. Topic: Clinical - Medication Question >> Aug 22, 2023  1:40 PM Deaijah H wrote: Reason for CRM: Patient called in wanting to let Manuelita the pharmacist to give him a call. Please call [phone]

## 2023-08-22 NOTE — Progress Notes (Signed)
 08/22/2023 Name: Micheal Clark [facility] MRN: [id number] DOB: 1955/04/08  Subjective  Chief Complaint  Patient presents with   Headache   Diabetes    Reason for visit: ?  Micheal Clark is a 68 y.o. male with a history of diabetes (type 2), who presents today for an initial diabetes pharmacotherapy visit.? Pertinent PMH also includes HLD, tobacco use, obesity.  Known DM Complications: no known complications   Care Team: Primary Care Provider: Watt Mirza, MD   Date of Last Diabetes Related Visit: with PCP on 06/09/23   Recent Summary of Change 7/1: Start Ozempic  0.25 mg/wk. Stop saxagliptin   Medication Access/Adherence: Prescription drug coverage: Payor: [employer] MEDICARE / Plan: [employer] MEDICARE HMO / Product Type: *No Product type* / .  - Reports that all medications are not affordable.  - GLP1 agents have been cost prohibitive. Ozempic  cost at his pharmacy was ~$500 for the first month.  - Household size 2: just under 500% FPL  Since Last visit / History of Present Illness: ?  Notes doing well on the Ozempic . No adverse effects with first month of 0.25 mg dose. Next injection due on Tuesday.   Does not check BG at home though has a glucometer to use as needed.   Reported DM Regimen: ?  Ozempic  0.25 mg weekly (Tuesdays) Metformin  XR 2000 mg daily - $0Glipizide  XL 20 mg daily at bedtime - $0   - $0 (Not taking, reports he though dr copland had stopped this at the last visit)   DM medications tried in the past:?  Saxagliptin  5 mg daily - $120/month (stopped d/t Ozempic  start)  SMBG: glucometer prn. No recent checks.    Hypo/Hyperglycemia: ?  Symptoms of hypoglycemia since last visit:? no  If yes, it was treated by: n/a  Symptoms of hyperglycemia since last visit:? no   Reported Diet: Patient typically eats 2 meals per day.  Breakfast: Skips Lunch: Typically eats out - vegetable plate, salad, hamburger Dinner: Corn and beans with meat (chicken, pork)  Beverages:  Sweet Tea (dilutes with water) - Drinks 1 gallon per week (~300-400 g sugar)  Exercise: No formal exercise.   DM Prevention:  Statin: Taking; high intensity.?  History of chronic kidney disease? no History of albuminuria? no, last UACR on 08/19/22 = 17 mg/g ACE/ARB - Not taking; Urine MA/CR Ratio - <30 mg/g.  Last eye exam: No record Last foot exam: No foot exam found Tobacco Use: Former smoker. Current use of smokeless tobacco  Cardiovascular Risk Reduction History of clinical ASCVD? no History of heart failure? no History of hyperlipidemia? yes Current BMI: 36.0 kg/m2 (Ht 66 in, Wt 101.2 kg) Taking statin? yes; high intensity (atorvastatin  40 mg) Taking aspirin? not indicated; Taking   Taking SGLT-2i? no Taking GLP- 1 RA? yes      _______________________________________________  Objective    Review of Systems:? Limited in the setting of virtual visit  GI:? No nausea, vomiting, constipation, diarrhea, abdominal pain, dyspepsia, change in bowel habits  Endocrine:? No polyuria, polyphagia or blurred vision    Physical Examination:  Vitals:  Wt Readings from Last 3 Encounters:  06/13/23 224 lb 9.6 oz (101.9 kg)  06/08/23 223 lb 2 oz (101.2 kg)  05/18/23 226 lb (102.5 kg)   BP Readings from Last 3 Encounters:  06/13/23 130/78  06/08/23 130/70  05/18/23 (!) 144/85   Pulse Readings from Last 3 Encounters:  06/13/23 70  06/08/23 75  05/18/23 71     Labs:?  Lab  Results  Component Value Date   HGBA1C 10.1 (A) 06/08/2023   HGBA1C 9.0 (A) 11/02/2022   HGBA1C 10.2 (A) 09/15/2022   GLUCOSE 260 (H) 05/18/2023   CREATININE 0.76 05/18/2023   CREATININE 0.81 08/19/2022   CREATININE 0.85 04/23/2020   GFR 92.46 08/19/2022   GFR 92.62 04/23/2020   GFR 96.77 05/12/2018    Lab Results  Component Value Date   CHOL 121 12/02/2022   LDLCALC 67 12/02/2022   LDLCALC 118 (H) 08/19/2022   LDLCALC 95 04/23/2020   LDLDIRECT 68 12/02/2022   LDLDIRECT 162.9 10/11/2011   HDL 33  (L) 12/02/2022   TRIG 116 12/02/2022   TRIG 162.0 (H) 08/19/2022   TRIG 145.0 04/23/2020   ALT 17 12/02/2022   ALT 20 08/19/2022   AST 14 12/02/2022   AST 14 08/19/2022      Chemistry      Component Value Date/Time   NA 139 05/18/2023 1141   K 4.1 05/18/2023 1141   CL 105 05/18/2023 1141   CO2 25 05/18/2023 1141   BUN 11 05/18/2023 1141   CREATININE 0.76 05/18/2023 1141   CREATININE 0.86 09/29/2018 1321      Component Value Date/Time   CALCIUM  9.0 05/18/2023 1141   ALKPHOS 86 12/02/2022 1404   AST 14 12/02/2022 1404   ALT 17 12/02/2022 1404   BILITOT 0.8 12/02/2022 1404     The ASCVD Risk score (Arnett DK, et al., 2019) failed to calculate for the following reasons:   The valid total cholesterol range is 130 to 320 mg/dL  Assessment and Plan:   1. Diabetes, type 2: uncontrolled per last A1c of 10.1% (06/08/23), increased from previous 9%. Goal <7% without hypoglycemia. Not checking BG at home. Has tolerated first month of Ozempic  well without issues. Due for increase to 0.5 mg tomorrow. Reports stopping pioglitazone  as he believes PCP stopped this at last visit. No documentation suggesting this, though will run this by Dr. Watt. Current Regimen: Ozempic  0.25 mg/wk, MTF XR 2000 mg/d, glipizide  XL 20 mg/d  Plan Increase Ozempic  to 0.5 mg weekly (08/23/23)  Future Consideration: GLP1-RA: Ideal agent given A1c-reduction propensity. No hx renal disease or ASCVD. Cost barrier historically though approved for Novo PAP. Titrate to max tolerated.  SGLT2i: No hx CKD.  UACR has trended up though remains <30 mg/g. Reasonable agent though would defer to when closer to euglycemia d/t risk volume depletion esp w current positional vertigo concerns. Likely cost barrier. Consider if worsening of UACR >30 mg/g.   Follow Up Follow up with clinical pharmacist 4-6 weeks Patient given direct line for questions regarding medication therapy  Future Appointments  Date Time Provider  Department Center  08/31/2023 11:30 AM [facility] PHARMACIST [facility]    Micheal Clark, PharmD Clinical Pharmacist [facility] [phone]

## 2023-08-31 ENCOUNTER — Other Ambulatory Visit

## 2023-09-07 ENCOUNTER — Encounter: Payer: Self-pay | Admitting: Family Medicine

## 2023-09-07 ENCOUNTER — Ambulatory Visit: Admitting: Family Medicine

## 2023-09-07 VITALS — BP 124/84 | HR 75 | Temp 97.8°F | Ht 66.0 in | Wt 216.5 lb

## 2023-09-07 DIAGNOSIS — Z125 Encounter for screening for malignant neoplasm of prostate: Secondary | ICD-10-CM | POA: Diagnosis not present

## 2023-09-07 DIAGNOSIS — Z7985 Long-term (current) use of injectable non-insulin antidiabetic drugs: Secondary | ICD-10-CM

## 2023-09-07 DIAGNOSIS — E119 Type 2 diabetes mellitus without complications: Secondary | ICD-10-CM | POA: Diagnosis not present

## 2023-09-07 DIAGNOSIS — Z79899 Other long term (current) drug therapy: Secondary | ICD-10-CM | POA: Diagnosis not present

## 2023-09-07 DIAGNOSIS — E782 Mixed hyperlipidemia: Secondary | ICD-10-CM | POA: Diagnosis not present

## 2023-09-07 LAB — LIPID PANEL
Cholesterol: 103 mg/dL (ref 0–200)
HDL: 34.5 mg/dL — ABNORMAL LOW (ref >39.00)
LDL Cholesterol: 55 mg/dL (ref 0–99)
NonHDL: 68.41
Total CHOL/HDL Ratio: 3
Triglycerides: 69 mg/dL (ref 0.0–149.0)
VLDL: 13.8 mg/dL (ref 0.0–40.0)

## 2023-09-07 LAB — MICROALBUMIN / CREATININE URINE RATIO
Creatinine,U: 180.1 mg/dL
Microalb Creat Ratio: 6.8 mg/g (ref 0.0–30.0)
Microalb, Ur: 1.2 mg/dL (ref 0.0–1.9)

## 2023-09-07 LAB — HEPATIC FUNCTION PANEL
ALT: 14 U/L (ref 0–53)
AST: 12 U/L (ref 0–37)
Albumin: 3.9 g/dL (ref 3.5–5.2)
Alkaline Phosphatase: 62 U/L (ref 39–117)
Bilirubin, Direct: 0.2 mg/dL (ref 0.0–0.3)
Total Bilirubin: 0.9 mg/dL (ref 0.2–1.2)
Total Protein: 6.1 g/dL (ref 6.0–8.3)

## 2023-09-07 LAB — POCT GLYCOSYLATED HEMOGLOBIN (HGB A1C): Hemoglobin A1C: 8.6 % — AB (ref 4.0–5.6)

## 2023-09-07 LAB — TSH: TSH: 3.98 u[IU]/mL (ref 0.35–5.50)

## 2023-09-07 LAB — PSA, MEDICARE: PSA: 0.57 ng/mL (ref 0.10–4.00)

## 2023-09-07 NOTE — Progress Notes (Signed)
 Zenia Guest T. Sharde Gover, MD, CAQ [facility] at [facility] [address] [place], [address]  Phone: [phone]  FAX: [phone]  Micheal Clark - 68 y.o. male  MRN [id number]  Date of Birth: 1955/05/20  Date: 09/07/2023  PCP: Watt Mirza, MD  Referral: Watt Mirza, MD  Chief Complaint  Patient presents with   Diabetes   Subjective:   Micheal Clark is a 68 y.o. very pleasant male patient with Body mass index is 34.94 kg/m. who presents with the following:  Discussed the use of AI scribe software for clinical note transcription with the patient, who gave verbal consent to proceed.  History of Present Illness Micheal Clark is a 67 year old male with diabetes who presents for a follow-up on his diabetes management.  He is managing his diabetes with glipizide , metformin , and Ozempic . He takes glipizide  twice daily, metformin  four times daily, and has been on 0.5 mg of Ozempic  for two weeks, after starting with 0.25 mg for four weeks. His A1c has improved from over 10 to 8.6. He has discontinued Actos  (pioglitazone ).  He is also taking aspirin and atorvastatin  for cholesterol management.  He continues to use smokeless tobacco, consuming a tin every couple of days, and finds it difficult to quit. 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  He has not eaten breakfast today and is agreeable to having blood work done. He has already provided a urine sample. 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   Review of Systems is noted in the HPI, as appropriate  Objective:   BP 124/84   Pulse 75   Temp 97.8 F (36.6 C) (Temporal)   Ht 5' 6 (1.676 m)   Wt 216 lb 8 oz (98.2 kg)   SpO2 97%   BMI 34.94 kg/m   GEN: No acute distress; alert,appropriate. CV: RRR, no m/g/r  PULM: Breathing comfortably in no respiratory distress PSYCH: Normally interactive.   Physical  Exam   Laboratory and Imaging Data:  Results for orders placed or performed in visit on 09/07/23  POCT glycosylated hemoglobin (Hb A1C)   Collection Time: 09/07/23 10:43 AM  Result Value Ref Range   Hemoglobin A1C 8.6 (A) 4.0 - 5.6 %   HbA1c POC (<> result, manual entry)     HbA1c, POC (prediabetic range)     HbA1c, POC (controlled diabetic range)       Assessment and Plan:     ICD-10-CM   1. Controlled type 2 diabetes mellitus without complication, without long-term current use of insulin (HCC)  E11.9 POCT glycosylated hemoglobin (Hb A1C)    Microalbumin / creatinine urine ratio      Assessment and Plan Assessment & Plan Type 2 diabetes mellitus Improved glycemic control with decreased Hemoglobin A1c. Transitioning to higher Ozempic  dose. Discontinued pioglitazone . Anticipated further improvement with Ozempic  titration. - Increase Ozempic  to 1 mg in two weeks. - Check urine microalbumin - Perform blood work including PSA, liver panel, cholesterol panel, and thyroid  panel.  Mixed hyperlipidemia Managed with atorvastatin .  Tobacco use (smokeless) Continued use despite difficulty quitting.  Orders placed today for conditions managed today: Orders Placed This Encounter  Procedures   Microalbumin / creatinine urine ratio   POCT glycosylated hemoglobin (Hb A1C)    Disposition: No follow-ups on file.  Dragon [employer] One speech-to-text software was used for transcription in this dictation.  Possible transcriptional errors can occur  using Animal nutritionist.   Signed,  Jacques DASEN. Tamorah Hada, MD   Outpatient Encounter Medications as of 09/07/2023  Medication Sig   aspirin 81 MG tablet Take 81 mg by mouth daily.   atorvastatin  (LIPITOR) 40 MG tablet Take 1 tablet (40 mg total) by mouth daily.   cyanocobalamin (VITAMIN B12) 1000 MCG tablet Take 1,000 mcg by mouth daily.   glipiZIDE  (GLUCOTROL  XL) 10 MG 24 hr tablet TAKE TWO TABLETS BY MOUTH AT BEDTIME   metFORMIN   (GLUCOPHAGE -XR) 500 MG 24 hr tablet TAKE FOUR TABLETS BY MOUTH EVERY MORNING WITH FOOD   pioglitazone  (ACTOS ) 45 MG tablet Take 1 tablet (45 mg total) by mouth daily. (Patient not taking: Reported on 09/07/2023)   Semaglutide , 1 MG/DOSE, 4 MG/3ML SOPN Inject 1 mg as directed once a week. (Patient not taking: Reported on 09/07/2023)   Semaglutide ,0.25 or 0.5MG /DOS, (OZEMPIC , 0.25 OR 0.5 MG/DOSE,) 2 MG/3ML SOPN Inject 0.25 mg into the skin once a week. After one month, increase to 0.5 mg   No facility-administered encounter medications on file as of 09/07/2023.

## 2023-09-09 ENCOUNTER — Ambulatory Visit: Payer: Self-pay | Admitting: Family Medicine

## 2023-09-21 ENCOUNTER — Other Ambulatory Visit (INDEPENDENT_AMBULATORY_CARE_PROVIDER_SITE_OTHER): Admitting: Pharmacist

## 2023-09-21 ENCOUNTER — Telehealth: Payer: Self-pay | Admitting: *Deleted

## 2023-09-21 DIAGNOSIS — E119 Type 2 diabetes mellitus without complications: Secondary | ICD-10-CM

## 2023-09-21 DIAGNOSIS — Z7984 Long term (current) use of oral hypoglycemic drugs: Secondary | ICD-10-CM

## 2023-09-21 NOTE — Telephone Encounter (Signed)
 Reorder Form faxed to [employer] at [phone].

## 2023-09-21 NOTE — Progress Notes (Signed)
 09/21/2023 Name: Micheal Clark MRN: [id number] DOB: [birth year]  Subjective  Chief Complaint  Patient presents with   Diabetes    Reason for visit: ?  Micheal Clark is a 68 y.o. male with a history of diabetes (type 2), who presents today for an initial diabetes pharmacotherapy visit.? Pertinent PMH also includes HLD, tobacco use, obesity.  Known DM Complications: no known complications   Care Team: Primary Care Provider: Watt Mirza, MD   Date of Last Diabetes Related Visit: with PCP on 06/09/23   Recent Summary of Change 7/1: Start Ozempic  0.25 mg/wk. Stop saxagliptin   Medication Access/Adherence: Prescription drug coverage: Payor: HUMANA MEDICARE / Plan: HUMANA MEDICARE HMO / Product Type: *No Product type* / .  - Reports that all medications are not affordable.  - GLP1 agents have been cost prohibitive. Ozempic  cost at his pharmacy was ~$500 for the first month.  - Household size 2: just under 500% FPL  Since Last visit / History of Present Illness: ?  Notes doing well on the Ozempic . No adverse effects with Ozempic  so far. 4th 0.5 mg injection given this week. Notes with his dietary changes and Ozempic , he continues to lose weight. Reports ~20 lb weight loss since he started trying to lose. Overall goal reported by patient ~180 lbs.   Reported DM Regimen: ?  Ozempic  0.5 mg weekly (Tuesdays - 4th dose given 8/26) Metformin  XR 2000 mg daily - $0Glipizide  XL 20 mg daily at bedtime - $0    DM medications tried in the past:?  Saxagliptin  5 mg daily - $120/month (stopped d/t Ozempic  start)  SMBG: glucometer prn. No recent checks.    Hypo/Hyperglycemia: ?  Symptoms of hypoglycemia since last visit:? no  If yes, it was treated by: n/a Symptoms of hyperglycemia since last visit:? no   Reported Diet: Patient typically eats 2 meals per day. Slightly smaller portions on Ozempic , though meal frequency remains unchanged.  Breakfast: Skips Lunch: Typically eats out  - vegetable plate, salad, hamburger Dinner: Corn and beans with meat (chicken, pork)  Beverages: Sweet Tea (dilutes with water) - Drinks 1 gallon per week (~300-400 g sugar)  Exercise: No formal exercise.   Baseline BMI 06/16/23: 36.0 kg/m2 (Ht 66 in, Wt 101.2 kg)  Current BMI: 34.9 kg/m2 (Ht 66 in, Wt 98.2 kg)  DM Prevention:  Statin: Taking; high intensity.?  History of chronic kidney disease? no History of albuminuria? no, last UACR on 09/07/23 = 6.8 (improved form 17 mg/g) ACE/ARB - Not taking; Urine MA/CR Ratio - <30 mg/g.  Last eye exam: No record Last foot exam: No foot exam found Tobacco Use: Former smoker. Current use of smokeless tobacco  Cardiovascular Risk Reduction History of clinical ASCVD? no History of heart failure? no History of hyperlipidemia? yes Current BMI: 34.9 kg/m2 (Ht 66 in, Wt 98.2 kg) Taking statin? yes; high intensity (atorvastatin  40 mg) Taking aspirin? not indicated; Taking   Taking SGLT-2i? no Taking GLP- 1 RA? yes      _______________________________________________  Objective    Review of Systems:? Limited in the setting of virtual visit  GI:? No nausea, vomiting, constipation, diarrhea, abdominal pain, dyspepsia, change in bowel habits  Endocrine:? No polyuria, polyphagia or blurred vision    Physical Examination:  Vitals:  Wt Readings from Last 3 Encounters:  09/07/23 216 lb 8 oz (98.2 kg)  06/13/23 224 lb 9.6 oz (101.9 kg)  06/08/23 223 lb 2 oz (101.2 kg)   BP Readings from Last 3  Encounters:  09/07/23 124/84  06/13/23 130/78  06/08/23 130/70   Pulse Readings from Last 3 Encounters:  09/07/23 75  06/13/23 70  06/08/23 75     Labs:?  Lab Results  Component Value Date   HGBA1C 8.6 (A) 09/07/2023   HGBA1C 10.1 (A) 06/08/2023   HGBA1C 9.0 (A) 11/02/2022   GLUCOSE 260 (H) 05/18/2023   MICRALBCREAT 6.8 09/07/2023   CREATININE 0.76 05/18/2023   CREATININE 0.81 08/19/2022   CREATININE 0.85 04/23/2020   GFR 92.46 08/19/2022    GFR 92.62 04/23/2020   GFR 96.77 05/12/2018    Lab Results  Component Value Date   CHOL 103 09/07/2023   LDLCALC 55 09/07/2023   LDLCALC 67 12/02/2022   LDLCALC 118 (H) 08/19/2022   LDLDIRECT 68 12/02/2022   LDLDIRECT 162.9 10/11/2011   HDL 34.50 (L) 09/07/2023   TRIG 69.0 09/07/2023   TRIG 116 12/02/2022   TRIG 162.0 (H) 08/19/2022   ALT 14 09/07/2023   ALT 17 12/02/2022   AST 12 09/07/2023   AST 14 12/02/2022      Chemistry      Component Value Date/Time   NA 139 05/18/2023 1141   K 4.1 05/18/2023 1141   CL 105 05/18/2023 1141   CO2 25 05/18/2023 1141   BUN 11 05/18/2023 1141   CREATININE 0.76 05/18/2023 1141   CREATININE 0.86 09/29/2018 1321      Component Value Date/Time   CALCIUM  9.0 05/18/2023 1141   ALKPHOS 62 09/07/2023 1101   AST 12 09/07/2023 1101   ALT 14 09/07/2023 1101   BILITOT 0.9 09/07/2023 1101   BILITOT 0.8 12/02/2022 1404     The ASCVD Risk score (Arnett DK, et al., 2019) failed to calculate for the following reasons:   The valid total cholesterol range is 130 to 320 mg/dL  Assessment and Plan:   1. Diabetes, type 2: uncontrolled per last A1c of 8.6% (09/07/23), improved from 10.1% (06/08/23). Goal <7% without hypoglycemia. Not checking BG at home. Notably, improved A1c reflective of low dose Ozempic . Plan for further titration as tolerated expected to further reduce A1c.  Current Regimen: Ozempic  0.5 mg/wk, MTF XR 2000 mg/d, glipizide  XL 20 mg/d  Plan Increase Ozempic  to 1.0 mg weekly x4 weeks Then increase to Ozempic  2.0 mg thereafter as tolerated Reorder form started for next shipment of Ozempic  2.0 mg - uploaded to PCP eFax folder for review/signature Once signed, CMA may fax to [facility] and upload to chart. Msg. Sent to clinical pool If lower dose desired for tolerability, discussed w patient there is an option to administer 1.0 mg dose with the 2.0 mg pen as needed in the future.   Future Consideration: SGLT2i: No hx CKD.  UACR had trended  up though improved on recent UACR. 17 ? 6 mg/g. Reasonable agent if future need for additional glycemic control.    Follow Up Follow up with clinical pharmacist 8 weeks Patient given direct line for questions regarding medication therapy prior to scheduled follow up  Future Appointments  Date Time Provider Department Center  11/23/2023  3:00 PM [facility] [place] PHARMACIST [facility] [address]     Manuelita FABIENE Kobs, PharmD Clinical Pharmacist [facility] [phone]

## 2023-09-21 NOTE — Telephone Encounter (Signed)
-----   Message from Manuelita JONELLE Kobs sent at 09/21/2023  3:50 PM EDT -----  Reorder form started for next shipment of Ozempic  2.0 mg - uploaded to PCP eFax folder for review/signature  o Once signed, CMA may fax to [facility] and upload to chart. l

## 2023-10-21 ENCOUNTER — Telehealth: Payer: Self-pay | Admitting: Family Medicine

## 2023-10-21 NOTE — Telephone Encounter (Signed)
 Received from [facility] PAP:  Ozempic  8 mg/63ml Lot# [id number] Exp: 06/25/2026 x 4 boxes.  Micheal Clark notified by telephone that medication is ready to be picked up here at the office.

## 2023-10-24 ENCOUNTER — Other Ambulatory Visit: Payer: Self-pay | Admitting: Family Medicine

## 2023-11-17 ENCOUNTER — Telehealth: Payer: Self-pay | Admitting: Pharmacist

## 2023-11-17 ENCOUNTER — Telehealth: Payer: Self-pay | Admitting: Family Medicine

## 2023-11-17 DIAGNOSIS — E119 Type 2 diabetes mellitus without complications: Secondary | ICD-10-CM

## 2023-11-17 NOTE — Telephone Encounter (Signed)
 Would like to be contacted by Pharmacist regarding ozempic  has questions. States has a visit scheduled next week but he will be working hoping to speak to her sooner.

## 2023-11-17 NOTE — Telephone Encounter (Signed)
 11/17/2023 Name: Micheal Clark MRN: [id number] DOB: 08-May-1955  Subjective  Chief Complaint  Patient presents with   Diabetes    Reason for visit: ?  Micheal Clark is a 68 y.o. male with a history of diabetes (type 2), who presents today for an initial diabetes pharmacotherapy visit.? Pertinent PMH also includes HLD, tobacco use, obesity.  Known DM Complications: no known complications   Care Team: Primary Care Provider: Watt Mirza, MD   Date of Last Diabetes Related Visit: with PCP on 09/07/23; With PharmD on 8/27  Recent Summary of Change 8/27: Continue Ozempic  titration to 1.0 mg x4 weeks.  7/1: Start Ozempic  0.25 mg/wk. Stop saxagliptin . Titrate q4 weeks.   Medication Access/Adherence: Prescription drug coverage: Payor: HUMANA MEDICARE / Plan: HUMANA MEDICARE HMO / Product Type: *No Product type* / . High deductible plan ~$400, then brand meds = $47/mo thereafter  - Reports that all medications are affordable.  - Household size 2: just under 500% FPL - Enrolled in [facility] PAP (Ozempic )  Since Last visit / History of Present Illness: ?  Notes doing well on Ozempic . Just completed his first month of the 2.0 mg dose. Notes he picked up [facility] refill this week (4 boxes of 2.0 mg). Feels he is tolerating this dose well, though questions if nighttime acid reflux may be a side effect given this is relatively new over the past few weeks. Relived with Tums.   Continues to report gradual weight loss. Overall personal goal reported by patient ~180 lbs.   Reported DM Regimen: ?  Ozempic  2.0 mg weekly Metformin  XR 2000 mg daily Glipizide  XL 20 mg daily at bedtime   DM medications tried in the past:?  Saxagliptin  5 mg daily - $120/month (stopped d/t Ozempic  start)  SMBG: glucometer prn. No recent checks.  Last week checked a couple of ~5pm sugars though unclear timing in regard to last meal. Readings were 160-180. No fasting readings.   Hypo/Hyperglycemia: ?  Symptoms of  hypoglycemia since last visit:? no  If yes, it was treated by: n/a Symptoms of hyperglycemia since last visit:? no   Reported Diet: Patient typically eats 2 meals per day. Slightly smaller portions on Ozempic , though meal frequency remains unchanged.  Breakfast: Skips Lunch: Typically eats out - vegetable plate, salad, hamburger Dinner: Corn and beans with meat (chicken, pork)  Beverages: Sweet Tea (dilutes with water) - Drinks 1 gallon per week (~300-400 g sugar)  Exercise: No formal exercise.   Baseline BMI 06/16/23: 36.0 kg/m2 (Ht 66 in, Wt 101.2 kg)  BMI Last appointment: 34.9 kg/m2 (Ht 66 in, Wt 98.2 kg) No weight reported today.   DM Prevention:  Statin: Taking; high intensity.?  History of chronic kidney disease? no History of albuminuria? no, last UACR on 09/07/23 = 6.8 (improved form 17 mg/g) ACE/ARB - Not taking; Urine MA/CR Ratio - <30 mg/g.  Last eye exam: No record Last foot exam: No foot exam found Tobacco Use: Former smoker. Current use of smokeless tobacco.  Cardiovascular Risk Reduction History of clinical ASCVD? no History of heart failure? no History of hyperlipidemia? yes Current BMI: 34.9 kg/m2 (Ht 66 in, Wt 98.2 kg) Taking statin? yes; high intensity (atorvastatin  40 mg) Taking aspirin? not indicated; Taking   Taking SGLT-2i? no Taking GLP- 1 RA? yes      _______________________________________________  Objective    Review of Systems:? Limited in the setting of virtual visit  GI:? +dyspepsia, No nausea, vomiting, constipation, diarrhea, abdominal pain, change in bowel  habits  Endocrine:? No polyuria, polyphagia or blurred vision    Physical Examination:  Vitals:  Wt Readings from Last 3 Encounters:  09/07/23 216 lb 8 oz (98.2 kg)  06/13/23 224 lb 9.6 oz (101.9 kg)  06/08/23 223 lb 2 oz (101.2 kg)   BP Readings from Last 3 Encounters:  09/07/23 124/84  06/13/23 130/78  06/08/23 130/70   Pulse Readings from Last 3 Encounters:  09/07/23 75   06/13/23 70  06/08/23 75     Labs:?  Lab Results  Component Value Date   HGBA1C 8.6 (A) 09/07/2023   HGBA1C 10.1 (A) 06/08/2023   HGBA1C 9.0 (A) 11/02/2022   GLUCOSE 260 (H) 05/18/2023   MICRALBCREAT 6.8 09/07/2023   CREATININE 0.76 05/18/2023   CREATININE 0.81 08/19/2022   CREATININE 0.85 04/23/2020   GFR 92.46 08/19/2022   GFR 92.62 04/23/2020   GFR 96.77 05/12/2018    Lab Results  Component Value Date   CHOL 103 09/07/2023   LDLCALC 55 09/07/2023   LDLCALC 67 12/02/2022   LDLCALC 118 (H) 08/19/2022   LDLDIRECT 68 12/02/2022   LDLDIRECT 162.9 10/11/2011   HDL 34.50 (L) 09/07/2023   TRIG 69.0 09/07/2023   TRIG 116 12/02/2022   TRIG 162.0 (H) 08/19/2022   ALT 14 09/07/2023   ALT 17 12/02/2022   AST 12 09/07/2023   AST 14 12/02/2022      Chemistry      Component Value Date/Time   NA 139 05/18/2023 1141   K 4.1 05/18/2023 1141   CL 105 05/18/2023 1141   CO2 25 05/18/2023 1141   BUN 11 05/18/2023 1141   CREATININE 0.76 05/18/2023 1141   CREATININE 0.86 09/29/2018 1321      Component Value Date/Time   CALCIUM  9.0 05/18/2023 1141   ALKPHOS 62 09/07/2023 1101   AST 12 09/07/2023 1101   ALT 14 09/07/2023 1101   BILITOT 0.9 09/07/2023 1101   BILITOT 0.8 12/02/2022 1404     The ASCVD Risk score (Arnett DK, et al., 2019) failed to calculate for the following reasons:   The valid total cholesterol range is 130 to 320 mg/dL  Assessment and Plan:   1. Diabetes, type 2: uncontrolled per last A1c of 8.6% (09/07/23), improved from 10.1% (06/08/23). Goal <7% without hypoglycemia. Not checking BG at home though expect improvement given 1 month on max dose Ozempic . Patient denies s/sx mid-day or between-meal lows though advised occasional BG check pre-meal as to ensure glipizide  dose still appropriate. As sugars improve, ideally may be able to come down on glipizide  dose.  Current Regimen: Ozempic  2.0 mg/wk, MTF XR 2000 mg/d, glipizide  XL 20 mg/d  Plan Continue all  medications Overdue for A1c, no PCP follow up currently scheduled.  Discussed removal of Ozempic  from PAP 2026.  Reviewed Medicare open enrollment with patient. Encouraged them to review various plans to find a plan that suits them in regard to both medical and prescription coverage.  Continue PRN Tums for dyspepsia. Gavascon at bedtime if reflux not resolved with tums. Avoid eating within 2 hours of bedtime. Caution with high-acid/spicy foods.  Reach out if ongoing reflux after second month on Ozempic  2mg  dose.   Future Consideration: SGLT2i: No hx CKD.  UACR had trended up though improved on recent UACR. 17 ? 6 mg/g. Reasonable agent if future need for additional glycemic control.  Insulin always remains an alternative if GLP1 cost-prohibitive, though is doing quite well on Ozempic     Follow Up Follow up with clinical  pharmacist ~8 weeks Patient given direct line for questions regarding medication therapy prior to scheduled follow up  Future Appointments  Date Time Provider Department Center  11/23/2023  3:00 PM [facility] PHARMACIST [facility] [address]   Manuelita FABIENE Kobs, PharmD Clinical Pharmacist [facility] [phone]

## 2023-11-17 NOTE — Telephone Encounter (Signed)
 Patient arrived in office today to pick up medication

## 2023-11-22 ENCOUNTER — Encounter: Payer: Self-pay | Admitting: Internal Medicine

## 2023-11-23 ENCOUNTER — Other Ambulatory Visit

## 2023-11-23 DIAGNOSIS — H2513 Age-related nuclear cataract, bilateral: Secondary | ICD-10-CM | POA: Diagnosis not present

## 2023-11-23 DIAGNOSIS — D3131 Benign neoplasm of right choroid: Secondary | ICD-10-CM | POA: Diagnosis not present

## 2023-11-23 DIAGNOSIS — R7303 Prediabetes: Secondary | ICD-10-CM | POA: Diagnosis not present

## 2023-11-23 DIAGNOSIS — H52223 Regular astigmatism, bilateral: Secondary | ICD-10-CM | POA: Diagnosis not present

## 2023-11-23 LAB — OPHTHALMOLOGY REPORT-SCANNED

## 2023-12-19 ENCOUNTER — Ambulatory Visit

## 2023-12-19 VITALS — Ht 68.0 in | Wt 204.0 lb

## 2023-12-19 DIAGNOSIS — Z860100 Personal history of colon polyps, unspecified: Secondary | ICD-10-CM

## 2023-12-19 MED ORDER — NA SULFATE-K SULFATE-MG SULF 17.5-3.13-1.6 GM/177ML PO SOLN: 1.0000 | Freq: Once | ORAL | 0 refills | Status: AC

## 2023-12-19 NOTE — Progress Notes (Signed)

## 2023-12-26 ENCOUNTER — Telehealth: Payer: Self-pay

## 2023-12-26 NOTE — Telephone Encounter (Signed)
 Copied from CRM [id number]. Topic: Clinical - Medical Advice >> Dec 26, 2023 11:35 AM Micheal Clark wrote: Reason for CRM:  Micheal Clark has a colonoscopy on 01/03/24. He usually takes his Semaglutide , 2 MG/DOSE, (OZEMPIC , 2 MG/DOSE,) 8 MG/3ML SOPN on Tuesday and since he is having the colonoscopy 7 days from tomorrow and was told not to take it tomorrow. Micheal Clark wants to know if he can take medication today (1 day early). Please call him at [phone].

## 2023-12-26 NOTE — Telephone Encounter (Signed)
 Micheal Clark notified by telephone to hold his Ozempic  until after his colonoscopy per Dr. Watt.  Patient states understanding.

## 2023-12-26 NOTE — Telephone Encounter (Signed)
 I would go ahead and hold it until after the colonoscopy.

## 2023-12-28 ENCOUNTER — Encounter: Payer: Self-pay | Admitting: Internal Medicine

## 2024-01-02 ENCOUNTER — Telehealth: Payer: Self-pay | Admitting: Internal Medicine

## 2024-01-02 NOTE — Telephone Encounter (Signed)
 Inbound call from patient states he had a cherry this morning off of a chick fil a milk shake. Please advise.

## 2024-01-02 NOTE — Progress Notes (Unsigned)
 [facility] History and Physical   Primary Care Physician:  Watt Mirza, MD   Reason for Procedure:    Encounter Diagnosis  Name Primary?   History of colonic polyps Yes     Plan:    Colonoscopy   The patient was provided an opportunity to ask questions and all were answered. The patient agreed with the plan.   HPI: Micheal Clark is a 68 y.o. male with a history of removal of 5 adenomatous colon polyps, maximum size 12 mm, in 2013.  He has not been seen here since send presents for follow-up colonoscopy exam.   Past Medical History:  Diagnosis Date   CAD (coronary artery disease)    Diabetes type 2, uncontrolled 11/15/2011   GERD (gastroesophageal reflux disease)    Hyperlipidemia 11/15/2011   Personal history of adenomatous colonic polyps 11/24/2011   Smokeless tobacco use 10/11/2011    Past Surgical History:  Procedure Laterality Date   ORIF ACETABULAR FRACTURE Left 1998   left   TOTAL HIP ARTHROPLASTY Left 2021   WISDOM TOOTH EXTRACTION       Current Outpatient Medications  Medication Sig Dispense Refill   aspirin 81 MG tablet Take 81 mg by mouth daily.     atorvastatin  (LIPITOR) 40 MG tablet Take 1 tablet (40 mg total) by mouth daily. 90 tablet 3   cyanocobalamin (VITAMIN B12) 1000 MCG tablet Take 1,000 mcg by mouth daily.     glipiZIDE  (GLUCOTROL  XL) 10 MG 24 hr tablet TAKE TWO TABLETS BY MOUTH AT BEDTIME 180 tablet 3   metFORMIN  (GLUCOPHAGE -XR) 500 MG 24 hr tablet TAKE FOUR TABLETS BY MOUTH EVERY MORNING WITH FOOD 360 tablet 1   Semaglutide , 2 MG/DOSE, (OZEMPIC , 2 MG/DOSE,) 8 MG/3ML SOPN Inject 2 mg into the skin once a week.     No current facility-administered medications for this visit.    Allergies as of 01/03/2024   (No Known Allergies)    Family History  Problem Relation Age of Onset   Diabetes Mother    Hypertension Brother    Vision loss Paternal Grandfather    Colon cancer Neg Hx    Rectal cancer Neg Hx    Stomach  cancer Neg Hx    Colon polyps Neg Hx    Esophageal cancer Neg Hx     Social History   Socioeconomic History   Marital status: Married    Spouse name: Not on file   Number of children: 2   Years of education: Not on file   Highest education level: Not on file  Occupational History   Occupation: [employer]    Employer: [employer]     Comment: truck driver  Tobacco Use   Smoking status: Former    Current packs/day: 0.00    Average packs/day: 1 pack/day for 8.0 years (8.0 ttl pk-yrs)    Types: Cigarettes    Start date: 03/08/1972    Quit date: 03/08/1980    Years since quitting: 43.8   Smokeless tobacco: Current    Types: Chew   Tobacco comments:    quit 1993  Vaping Use   Vaping status: Never Used  Substance and Sexual Activity   Alcohol use: Yes    Comment: occassionally-not wekly   Drug use: No   Sexual activity: Not on file  Other Topics Concern   Not on file  Social History Narrative   Not on file   Social Drivers of Health   Financial Resource Strain: Low Risk (03/10/2020)  Received from [facility]   Overall Financial Resource Strain (CARDIA)    Difficulty of Paying Living Expenses: Not very hard  Food Insecurity: No Food Insecurity (03/07/2020)   Received from [facility]   Hunger Vital Sign    Within the past 12 months, you worried that your food would run out before you got the money to buy more.: Never true    Within the past 12 months, the food you bought just didn't last and you didn't have money to get more.: Never true  Transportation Needs: Not on file  Physical Activity: Not on file  Stress: No Stress Concern Present (03/10/2020)   Received from [facility] - Occupational Stress Questionnaire    Feeling of Stress : Only a little  Social Connections: Unknown (06/08/2021)   Received from [facility]   Social Network    Social Network: Not on file  Intimate Partner Violence: Unknown  (04/30/2021)   Received from [facility]   HITS    Physically Hurt: Not on file    Insult or Talk Down To: Not on file    Threaten Physical Harm: Not on file    Scream or Curse: Not on file    Review of Systems: Positive for *** All other review of systems negative except as mentioned in the HPI.  Physical Exam: Vital signs There were no vitals taken for this visit.  General:   Alert,  Well-developed, well-nourished, pleasant and cooperative in NAD Lungs:  Clear throughout to auscultation.   Heart:  Regular rate and rhythm; no murmurs, clicks, rubs,  or gallops. Abdomen:  Soft, nontender and nondistended. Normal bowel sounds.   Neuro/Psych:  Alert and cooperative. Normal mood and affect. A and O x 3   @Hina Gupta  CHARLENA Commander, MD, [facility] [phone] (pager) 01/02/2024 5:13 PM@

## 2024-01-03 ENCOUNTER — Encounter: Payer: Self-pay | Admitting: Internal Medicine

## 2024-01-03 ENCOUNTER — Ambulatory Visit: Admitting: Internal Medicine

## 2024-01-03 VITALS — BP 117/71 | HR 78 | Temp 97.4°F | Resp 18 | Ht 66.0 in | Wt 204.0 lb

## 2024-01-03 DIAGNOSIS — D120 Benign neoplasm of cecum: Secondary | ICD-10-CM | POA: Diagnosis not present

## 2024-01-03 DIAGNOSIS — Z860100 Personal history of colon polyps, unspecified: Secondary | ICD-10-CM

## 2024-01-03 DIAGNOSIS — D125 Benign neoplasm of sigmoid colon: Secondary | ICD-10-CM

## 2024-01-03 DIAGNOSIS — D123 Benign neoplasm of transverse colon: Secondary | ICD-10-CM

## 2024-01-03 DIAGNOSIS — D122 Benign neoplasm of ascending colon: Secondary | ICD-10-CM

## 2024-01-03 DIAGNOSIS — D128 Benign neoplasm of rectum: Secondary | ICD-10-CM

## 2024-01-03 MED ORDER — SODIUM CHLORIDE 0.9 % IV SOLN: 500.0000 mL | INTRAVENOUS | Status: DC

## 2024-01-03 NOTE — Progress Notes (Signed)
 Pt's states no medical or surgical changes since previsit or office visit.

## 2024-01-03 NOTE — Patient Instructions (Addendum)
 I found and removed 9 small polyps.  I will let you know pathology results and when to have another routine colonoscopy by mail and/or My Chart.  You also have a condition called diverticulosis - common and not usually a problem. Please read the handout provided. Internal hemorrhoids also seen.   I appreciate the opportunity to care for you. Lupita CHARLENA Commander, MD, [facility]  Discharge instructions given. Handouts on polyps,Diverticulosis and Hemorrhoids. Resume previous medications. YOU HAD AN ENDOSCOPIC PROCEDURE TODAY AT THE [facility]:   Refer to the procedure report that was given to you for any specific questions about what was found during the examination.  If the procedure report does not answer your questions, please call your gastroenterologist to clarify.  If you requested that your care partner not be given the details of your procedure findings, then the procedure report has been included in a sealed envelope for you to review at your convenience later.  YOU SHOULD EXPECT: Some feelings of bloating in the abdomen. Passage of more gas than usual.  Walking can help get rid of the air that was put into your GI tract during the procedure and reduce the bloating. If you had a lower endoscopy (such as a colonoscopy or flexible sigmoidoscopy) you may notice spotting of blood in your stool or on the toilet paper. If you underwent a bowel prep for your procedure, you may not have a normal bowel movement for a few days.  Please Note:  You might notice some irritation and congestion in your nose or some drainage.  This is from the oxygen used during your procedure.  There is no need for concern and it should clear up in a day or so.  SYMPTOMS TO REPORT IMMEDIATELY:  Following lower endoscopy (colonoscopy or flexible sigmoidoscopy):  Excessive amounts of blood in the stool  Significant tenderness or worsening of abdominal pains  Swelling of the abdomen that is new, acute  Fever of  100F or higher    For urgent or emergent issues, a gastroenterologist can be reached at any hour by calling [phone]. Do not use MyChart messaging for urgent concerns.    DIET:  We do recommend a small meal at first, but then you may proceed to your regular diet.  Drink plenty of fluids but you should avoid alcoholic beverages for 24 hours.  ACTIVITY:  You should plan to take it easy for the rest of today and you should NOT DRIVE or use heavy machinery until tomorrow (because of the sedation medicines used during the test).    FOLLOW UP: Our staff will call the number listed on your records the next business day following your procedure.  We will call around 7:15- 8:00 am to check on you and address any questions or concerns that you may have regarding the information given to you following your procedure. If we do not reach you, we will leave a message.     If any biopsies were taken you will be contacted by phone or by letter within the next 1-3 weeks.  Please call us  at [phone] if you have not heard about the biopsies in 3 weeks.    SIGNATURES/CONFIDENTIALITY: You and/or your care partner have signed paperwork which will be entered into your electronic medical record.  These signatures attest to the fact that that the information above on your After Visit Summary has been reviewed and is understood.  Full responsibility of the confidentiality of this discharge information lies  with you and/or your care-partner.

## 2024-01-03 NOTE — Op Note (Signed)
 [facility] Patient Name: Micheal Clark Procedure Date: 01/03/2024 9:51 AM MRN: [id number] Endoscopist: Lupita FORBES Commander , MD, [id number] Age: 68 Referring MD:  Date of Birth: 1955/12/16 Gender: Male Account #: [id number] Procedure:                Colonoscopy Indications:              Surveillance: Personal history of adenomatous                            polyps on last colonoscopy > 5 years ago, Last                            colonoscopy: 2013 Medicines:                Monitored Anesthesia Care Procedure:                Pre-Anesthesia Assessment:                           - Prior to the procedure, a History and Physical                            was performed, and patient medications and                            allergies were reviewed. The patient's tolerance of                            previous anesthesia was also reviewed. The risks                            and benefits of the procedure and the sedation                            options and risks were discussed with the patient.                            All questions were answered, and informed consent                            was obtained. Prior Anticoagulants: The patient has                            taken no anticoagulant or antiplatelet agents. ASA                            Grade Assessment: II - A patient with mild systemic                            disease. After reviewing the risks and benefits,                            the patient was deemed in satisfactory condition to  undergo the procedure.                           After obtaining informed consent, the colonoscope                            was passed under direct vision. Throughout the                            procedure, the patient's blood pressure, pulse, and                            oxygen saturations were monitored continuously. The                            CF [id number] #[id number] was introduced through the  anus                            and advanced to the the cecum, identified by                            appendiceal orifice and ileocecal valve. The                            colonoscopy was performed without difficulty. The                            patient tolerated the procedure well. The quality                            of the bowel preparation was good. The ileocecal                            valve, appendiceal orifice, and rectum were                            photographed. The bowel preparation used was SUPREP                            via split dose instruction. Scope In: 10:04:28 AM Scope Out: 10:26:41 AM Scope Withdrawal Time: 0 hours 16 minutes 54 seconds  Total Procedure Duration: 0 hours 22 minutes 13 seconds  Findings:                 The perianal and digital rectal examinations were                            normal. Pertinent negatives include normal prostate                            (size, shape, and consistency).                           Nine sessile polyps were found in the rectum,  sigmoid colon, transverse colon, ascending colon                            and cecum. The polyps were 3 to 8 mm in size. These                            polyps were removed with a cold snare. Resection                            and retrieval were complete. Verification of                            patient identification for the specimen was done.                            Estimated blood loss was minimal.                           Multiple diverticula were found in the sigmoid                            colon and descending colon.                           Internal hemorrhoids were found.                           The exam was otherwise without abnormality on                            direct and retroflexion views. Complications:            No immediate complications. Estimated Blood Loss:     Estimated blood loss was minimal. Impression:                - Nine 3 to 8 mm polyps in the rectum, in the                            sigmoid colon, in the transverse colon, in the                            ascending colon and in the cecum, removed with a                            cold snare. Resected and retrieved.                           - Diverticulosis in the sigmoid colon and in the                            descending colon.                           - Internal hemorrhoids.                           -  The examination was otherwise normal on direct                            and retroflexion views.                           - Personal history of colonic polyps. 2013 - 5                            adenomas max 13 mm Recommendation:           - Patient has a contact number available for                            emergencies. The signs and symptoms of potential                            delayed complications were discussed with the                            patient. Return to normal activities tomorrow.                            Written discharge instructions were provided to the                            patient.                           - Resume previous diet.                           - Continue present medications.                           - Repeat colonoscopy is recommended for                            surveillance. The colonoscopy date will be                            determined after pathology results from today's                            exam become available for review. Lupita FORBES Commander, MD 01/03/2024 10:38:23 AM This report has been signed electronically.

## 2024-01-03 NOTE — Progress Notes (Signed)
 Transferred to PACU via stretcher.  Not responding to stimulation at this time.  VSS upon leaving procedure room.

## 2024-01-03 NOTE — Progress Notes (Signed)
 Called to room to assist during endoscopic procedure.  Patient ID and intended procedure confirmed with present staff. Received instructions for my participation in the procedure from the performing physician.

## 2024-01-04 ENCOUNTER — Telehealth: Payer: Self-pay

## 2024-01-04 NOTE — Telephone Encounter (Signed)
Left HIPAA compliant voicemail. 

## 2024-01-06 LAB — SURGICAL PATHOLOGY

## 2024-01-12 ENCOUNTER — Other Ambulatory Visit

## 2024-01-12 ENCOUNTER — Telehealth: Payer: Self-pay | Admitting: *Deleted

## 2024-01-12 DIAGNOSIS — E119 Type 2 diabetes mellitus without complications: Secondary | ICD-10-CM

## 2024-01-12 NOTE — Progress Notes (Unsigned)
 01/12/2024 Name: Micheal Clark MRN: [id number] DOB: 09-01-1955  Subjective  Chief Complaint  Patient presents with   Diabetes    Reason for visit: ?  Micheal Clark is a 68 y.o. male with a history of diabetes (type 2), who presents today for an initial diabetes pharmacotherapy visit.? Pertinent PMH also includes HLD, tobacco use, obesity.  Known DM Complications: no known complications   Care Team: Primary Care Provider: Watt Mirza, MD   Date of Last Diabetes Related Visit: with PCP on 09/07/23; With PharmD on 10/23  Recent Summary of Change 8/27: Continue Ozempic  titration to 1.0 mg x4 weeks.  7/1: Start Ozempic  0.25 mg/wk. Stop saxagliptin . Titrate q4 weeks.   Medication Access/Adherence: Prescription drug coverage: Payor: [employer] MEDICARE / Plan: [employer] MEDICARE HMO / Product Type: *No Product type* / . High deductible plan ~$400, then brand meds = $47/mo thereafter  - Reports that all medications are affordable.  - Household size 2: just under 500% FPL (Novo PAP pulled <400% electronically) - Enrolled in Novo PAP (Ozempic )  Since Last visit / History of Present Illness: ?  Notes doing well on Ozempic  2.0 mg weekly dose. Reflux symptoms reported previously have improved significantly. No longer having regular concerns with this. Denies side effects with resuming Ozempic  after colonoscopy.   Has 2 unopened Ozempic  boxes + 2 weeks left in his open box which will last thorough ~Feb.   Continues to report gradual weight loss. Overall personal goal reported by patient ~180 lbs.   Reported DM Regimen: ?  Ozempic  2.0 mg weekly Metformin  XR 2000 mg daily Glipizide  XL 20 mg daily at bedtime   DM medications tried in the past:?  Saxagliptin  5 mg daily - $120/month (stopped d/t Ozempic  start)  SMBG: glucometer prn. No recent checks.    Hypo/Hyperglycemia: ?  Symptoms of hypoglycemia since last visit:? no  Symptoms of hyperglycemia since last visit:? no    Reported Diet: Patient typically eats 2 meals per day. Slightly smaller portions on Ozempic , though meal frequency remains unchanged.  Breakfast: Skips Lunch: Typically eats out - vegetable plate, salad, hamburger Dinner: Corn and beans with meat (chicken, pork)  Beverages: Sweet Tea (dilutes with water) - Drinks 1 gallon per week (~300-400 g sugar/week)  Exercise: No formal exercise.   Baseline BMI 06/16/23: 36.0 kg/m2 (Ht 66 in, Wt 101.2 kg)  BMI Last appointment: 32.9 kg/m2 (Ht 66 in, Wt 92.5 kg) No weight reported today.   DM Prevention:  Statin: Taking; high intensity.?  History of chronic kidney disease? no History of albuminuria? no, last UACR on 09/07/23 = 6.8 (improved form 17 mg/g) ACE/ARB - Not taking; Urine MA/CR Ratio - <30 mg/g.  Last eye exam: No record Last foot exam: No foot exam found Tobacco Use: Former smoker. Current use of smokeless tobacco.  Cardiovascular Risk Reduction History of clinical ASCVD? no History of heart failure? no History of hyperlipidemia? yes Current BMI: 34.9 kg/m2 (Ht 66 in, Wt 98.2 kg) Taking statin? yes; high intensity (atorvastatin  40 mg) Taking aspirin? not indicated; Taking   Taking SGLT-2i? no Taking GLP- 1 RA? yes      _______________________________________________  Objective    Review of Systems:? Limited in the setting of virtual visit  GI:? No nausea, vomiting, constipation, diarrhea, abdominal pain, change in bowel habits  Endocrine:? No polyuria, polyphagia or blurred vision    Physical Examination:  Vitals:  Wt Readings from Last 3 Encounters:  01/03/24 204 lb (92.5 kg)  12/19/23 204 lb (  92.5 kg)  09/07/23 216 lb 8 oz (98.2 kg)   BP Readings from Last 3 Encounters:  01/03/24 117/71  09/07/23 124/84  06/13/23 130/78   Pulse Readings from Last 3 Encounters:  01/03/24 78  09/07/23 75  06/13/23 70     Labs:?  Lab Results  Component Value Date   HGBA1C 8.6 (A) 09/07/2023   HGBA1C 10.1 (A) 06/08/2023    HGBA1C 9.0 (A) 11/02/2022   GLUCOSE 260 (H) 05/18/2023   MICRALBCREAT 6.8 09/07/2023   CREATININE 0.76 05/18/2023   CREATININE 0.81 08/19/2022   CREATININE 0.85 04/23/2020   GFR 92.46 08/19/2022   GFR 92.62 04/23/2020   GFR 96.77 05/12/2018    Lab Results  Component Value Date   CHOL 103 09/07/2023   LDLCALC 55 09/07/2023   LDLCALC 67 12/02/2022   LDLCALC 118 (H) 08/19/2022   LDLDIRECT 68 12/02/2022   LDLDIRECT 162.9 10/11/2011   HDL 34.50 (L) 09/07/2023   TRIG 69.0 09/07/2023   TRIG 116 12/02/2022   TRIG 162.0 (H) 08/19/2022   ALT 14 09/07/2023   ALT 17 12/02/2022   AST 12 09/07/2023   AST 14 12/02/2022      Chemistry      Component Value Date/Time   NA 139 05/18/2023 1141   K 4.1 05/18/2023 1141   CL 105 05/18/2023 1141   CO2 25 05/18/2023 1141   BUN 11 05/18/2023 1141   CREATININE 0.76 05/18/2023 1141   CREATININE 0.86 09/29/2018 1321      Component Value Date/Time   CALCIUM  9.0 05/18/2023 1141   ALKPHOS 62 09/07/2023 1101   AST 12 09/07/2023 1101   ALT 14 09/07/2023 1101   BILITOT 0.9 09/07/2023 1101   BILITOT 0.8 12/02/2022 1404     The ASCVD Risk score (Arnett DK, et al., 2019) failed to calculate for the following reasons:   The valid total cholesterol range is 130 to 320 mg/dL  Assessment and Plan:   1. Diabetes, type 2: uncontrolled per last A1c of 8.6% (09/07/23), improved from 10.1% (06/08/23). Goal <7% without hypoglycemia. Since last A1c, has done well with several Ozempic  titrations. Suspect A1c likely improved though magnitude unclear as he is not checking sugars at home. Denies s/sx mid-day or between-meal lows, did not record any pre-meal sugars as discussed at last visit. Remains on glipizide .  Current Regimen: Ozempic  2.0 mg/wk, MTF XR 2000 mg/d, glipizide  XL 20 mg/d Medication Access 2026 Option to continue with Ozempic  though ~$450 deductible in Jan. Patient reports this is feasible if needed, though if cheaper options, he would like to  consider. May be able to get Trulicity through patient assistance, though reported income this year was significantly higher than the income Novo pulled. Unsure what income [employer] program will pull though can investigate this option.  We discussed repeat A1c as to help guide this decision clinically given Trulicity tends to have less A1c-lowering propensity. Patient agreeable. Lab visit scheduled next week.  Plan Continue all medications Lab-only visit scheduled for A1c, 12/23  Future Consideration: SGLT2i: No hx CKD.  UACR had trended up though improved on recent UACR. 17 ? 6 mg/g. Reasonable agent if future need for additional glycemic control Insulin + CGM remains an alternative if GLP1 cost-prohibitive   Follow Up Follow up with clinical pharmacist 1-2 weeks to review A1c/plan for GLP1 in 2026.  Patient given direct line for questions regarding medication therapy prior to scheduled follow up  Future Appointments  Date Time Provider Department Center  01/17/2024  9:15 AM [facility] LAB [facility] [address]  01/25/2024  3:30 PM [facility] PHARMACIST [facility] [address]   Manuelita FABIENE Kobs, PharmD Clinical Pharmacist [facility] [phone]

## 2024-01-12 NOTE — Telephone Encounter (Signed)
-----   Message from Veva JINNY Ferrari sent at 01/12/2024  3:51 PM EST ----- Regarding: Lab orders for Tue, 12.23.25 Lab orders for poct A1c?

## 2024-01-13 NOTE — Progress Notes (Signed)
 I have already placed future order in Epic per Lab request so I have cancelled this one.

## 2024-01-16 ENCOUNTER — Ambulatory Visit: Payer: Self-pay | Admitting: Internal Medicine

## 2024-01-17 ENCOUNTER — Other Ambulatory Visit

## 2024-01-17 ENCOUNTER — Ambulatory Visit: Payer: Self-pay | Admitting: Family Medicine

## 2024-01-17 DIAGNOSIS — E119 Type 2 diabetes mellitus without complications: Secondary | ICD-10-CM

## 2024-01-17 LAB — POCT GLYCOSYLATED HEMOGLOBIN (HGB A1C): Hemoglobin A1C: 7 % — AB (ref 4.0–5.6)

## 2024-01-18 ENCOUNTER — Other Ambulatory Visit (HOSPITAL_COMMUNITY): Payer: Self-pay

## 2024-01-25 ENCOUNTER — Other Ambulatory Visit (HOSPITAL_COMMUNITY): Payer: Self-pay

## 2024-01-25 ENCOUNTER — Other Ambulatory Visit: Admitting: Pharmacist

## 2024-01-25 DIAGNOSIS — E119 Type 2 diabetes mellitus without complications: Secondary | ICD-10-CM

## 2024-01-25 NOTE — Progress Notes (Signed)
" ° °  01/25/2024 Name: Nimai Burbach [facility] MRN: [id number] DOB: [birth year]  Subjective  Chief Complaint  Patient presents with   Diabetes   Reason for Call: Lab review / Medication insurance coverage 2026   Patient reports doing well. Excited to see A1c reduction last week. Preference at this time remains to stay with Ozempic  even with high initial cost though would like to revisit this in the earlier part of 2026 to see if still feasible (~$450 deductible). Will be out of Ozempic  End of Feb.   Lab Results  Component Value Date   HGBA1C 7.0 (A) 01/17/2024   HGBA1C 8.6 (A) 09/07/2023   HGBA1C 10.1 (A) 06/08/2023    Assessment and Plan:   1. Diabetes, type 2: controlled per last A1c of 7.0 (12/23). Decreased from 8.6% (8/13), 10.1% (5/14) with addition of Ozempic  earlier this year. Maintained on 2 mg without issues with ~8 weeks of medicine remaining. Patient notes preference to remain on Ozempic  even with his $450 deductible.  Refill will be due 1st week of March though will check on insurance coverage at start of Feb as to allow time for PAP application (Trulicity) if he decides Ozempic  is not affordable. Discussed lower efficacy with Trulicity, may see A1c rise slightly with this change but remains a good option if cannot get Ozempic Brena.  Current Regimen: Ozempic  2.0 mg/wk, MTF XR 2000 mg/d, glipizide  XL 20 mg/d Continue all medications Reminder set 1 month, Test claim for Ozempic  2 mg through 2026 insurance to confirm deductible/copay.  Future Consideration: If getting Ozempic  through insurance in 2026, may be able to transition to Mounjaro for same cost as needed for further A1c or weight reduction (with greater efficacy, ideally would allow for discontinuation of glipizide )  Insulin + CGM remains an alternative if GLP1 cost-prohibitive in the future   Follow Up Patient given direct line for questions regarding medication therapy prior to scheduled follow up Reminder set ~4 weeks  to evaluate Ozempic  cost through insurance   No future appointments.  Manuelita FABIENE Kobs, PharmD Clinical Pharmacist [facility] [phone] "

## 2024-02-22 ENCOUNTER — Ambulatory Visit: Payer: Self-pay

## 2024-02-22 NOTE — Progress Notes (Unsigned)
" ° ° ° °  Kelechi Orgeron T. Rhandi Despain, MD, CAQ Sports Medicine [facility] [address] [place], [address]  Phone: [phone]  FAX: [phone]  Micheal Clark - 69 y.o. male  MRN [id number]  Date of Birth: 1955-03-18  Date: 02/23/2024  PCP: Watt Mirza, MD  Referral: Watt Mirza, MD  No chief complaint on file.  Subjective:   Micheal Clark is a 69 y.o. very pleasant male patient with There is no height or weight on file to calculate BMI. who presents with the following:  Discussed the use of AI scribe software for clinical note transcription with the patient, who gave verbal consent to proceed.  Very well-known patient presents after a fall.  He was standing in the bed of his truck on February 19, 2024, at that point he slipped and fell off of the truck and onto the financial planner with his right side.  He then landed on his back on the ground.  It hurts to cough and take a brief breath History of Present Illness     Review of Systems is noted in the HPI, as appropriate  Objective:   There were no vitals taken for this visit.  GEN: No acute distress; alert,appropriate. PULM: Breathing comfortably in no respiratory distress PSYCH: Normally interactive.   Laboratory and Imaging Data:  Assessment and Plan:   No diagnosis found. Assessment & Plan   Medication Management during today's office visit: No orders of the defined types were placed in this encounter.  There are no discontinued medications.  Orders placed today for conditions managed today: No orders of the defined types were placed in this encounter.   Disposition: No follow-ups on file.  Dragon [employer] One speech-to-text software was used for transcription in this dictation.  Possible transcriptional errors can occur using Animal nutritionist.   Signed,  Mirza DASEN. Tamaka Sawin, MD   Outpatient Encounter Medications as of 02/23/2024  Medication Sig   aspirin 81 MG  tablet Take 81 mg by mouth daily.   atorvastatin  (LIPITOR) 40 MG tablet Take 1 tablet (40 mg total) by mouth daily.   cyanocobalamin (VITAMIN B12) 1000 MCG tablet Take 1,000 mcg by mouth daily.   glipiZIDE  (GLUCOTROL  XL) 10 MG 24 hr tablet TAKE TWO TABLETS BY MOUTH AT BEDTIME   metFORMIN  (GLUCOPHAGE -XR) 500 MG 24 hr tablet TAKE FOUR TABLETS BY MOUTH EVERY MORNING WITH FOOD   Semaglutide , 2 MG/DOSE, (OZEMPIC , 2 MG/DOSE,) 8 MG/3ML SOPN Inject 2 mg into the skin once a week.   No facility-administered encounter medications on file as of 02/23/2024.   "

## 2024-02-22 NOTE — Telephone Encounter (Signed)
 FYI Only or Action Required?: FYI only for provider: appointment scheduled on 02/23/24.  Patient was last seen in primary care on 09/07/2023 by Watt Mirza, MD.  Called Nurse Triage reporting Fall.  Symptoms began several days ago.  Interventions attempted: Nothing.  Symptoms are: unchanged.  Triage Disposition: See PCP When Office is Open (Within 3 Days)  Patient/caregiver understands and will follow disposition?:   Reason for Disposition  MILD weakness (e.g., does not interfere with ability to work, go to school, normal activities)  (Exception: Mild weakness is a chronic symptom.)  Answer Assessment - Initial Assessment Questions Pt calling to report a fall on 02/19/24. Pt states he was standing in the bed of his truck, slipped and fell off the back of the truck and onto the trailer hitch with his right side, then onto his back on the ground. Pt states it hurts to cough and take a deep breath. Denies seeing any bruises/open wounds.  1. MECHANISM: How did the fall happen?     Clemens off the back of my truck 2. DOMESTIC VIOLENCE AND ELDER ABUSE SCREENING: Did you fall because someone pushed you or tried to hurt you? If Yes, ask: Are you safe now?     No  3. ONSET: When did the fall happen? (e.g., minutes, hours, or days ago)     02/19/24 4. LOCATION: What part of the body hit the ground? (e.g., back, buttocks, head, hips, knees, hands, head, stomach)     Right side hit the trailer hitch and back hit the ground.  5. INJURY: Did you hurt (injure) yourself when you fell? If Yes, ask: What did you injure? Tell me more about this? (e.g., body area; type of injury; pain severity)     Right side of his torso 6. PAIN: Is there any pain? If Yes, ask: How bad is the pain? (e.g., Scale 0-10; or none, mild,      0/10 at rest 7. SIZE: For cuts, bruises, or swelling, ask: How large is it? (e.g., inches or centimeters)      Denies  9. OTHER SYMPTOMS: Do you have any other  symptoms? (e.g., dizziness, fever, weakness; new-onset or worsening).      Denies  10. CAUSE: What do you think caused the fall (or falling)? (e.g., dizzy spell, tripped)       Weather  Protocols used: Falls and Falling-A-AH  Summary: fell on truck hitch   Reason for Triage: Clemens on the ball of truck hitch on right side rib cage area, hit back on ground. 02/19/24 Sunday night. Hurts to breath

## 2024-02-23 ENCOUNTER — Ambulatory Visit
Admission: RE | Admit: 2024-02-23 | Discharge: 2024-02-23 | Disposition: A | Discharge status: Home / Self Care | Source: Ambulatory Visit | Attending: Family Medicine | Admitting: Family Medicine

## 2024-02-23 ENCOUNTER — Encounter: Payer: Self-pay | Admitting: Family Medicine

## 2024-02-23 ENCOUNTER — Ambulatory Visit (INDEPENDENT_AMBULATORY_CARE_PROVIDER_SITE_OTHER): Admitting: Family Medicine

## 2024-02-23 VITALS — BP 136/78 | HR 80 | Temp 98.5°F | Ht 66.0 in | Wt 202.2 lb

## 2024-02-23 DIAGNOSIS — R0789 Other chest pain: Secondary | ICD-10-CM | POA: Diagnosis not present

## 2024-02-26 ENCOUNTER — Other Ambulatory Visit: Payer: Self-pay | Admitting: Physician Assistant

## 2024-02-26 DIAGNOSIS — I2510 Atherosclerotic heart disease of native coronary artery without angina pectoris: Secondary | ICD-10-CM

## 2024-02-26 DIAGNOSIS — E785 Hyperlipidemia, unspecified: Secondary | ICD-10-CM

## 2024-02-27 ENCOUNTER — Ambulatory Visit: Payer: Self-pay | Admitting: Family Medicine

## 2024-02-27 ENCOUNTER — Other Ambulatory Visit: Payer: Self-pay | Admitting: Physician Assistant

## 2024-02-27 DIAGNOSIS — E785 Hyperlipidemia, unspecified: Secondary | ICD-10-CM

## 2024-02-27 DIAGNOSIS — I2510 Atherosclerotic heart disease of native coronary artery without angina pectoris: Secondary | ICD-10-CM

## 2024-04-27 ENCOUNTER — Ambulatory Visit

## 2024-06-07 ENCOUNTER — Ambulatory Visit
